# Patient Record
Sex: Female | Born: 1960 | Race: White | Hispanic: Yes | Marital: Single | Smoking: Never smoker
Health system: Southern US, Community
[De-identification: ages and names within clinical notes are randomized; demographics above are authoritative.]

## PROBLEM LIST (undated history)

## (undated) DIAGNOSIS — M1990 Unspecified osteoarthritis, unspecified site: Secondary | ICD-10-CM

## (undated) DIAGNOSIS — K760 Fatty (change of) liver, not elsewhere classified: Secondary | ICD-10-CM

## (undated) DIAGNOSIS — K648 Other hemorrhoids: Secondary | ICD-10-CM

## (undated) DIAGNOSIS — R7303 Prediabetes: Secondary | ICD-10-CM

## (undated) DIAGNOSIS — E785 Hyperlipidemia, unspecified: Secondary | ICD-10-CM

## (undated) DIAGNOSIS — I10 Essential (primary) hypertension: Secondary | ICD-10-CM

## (undated) HISTORY — PX: NO PAST SURGERIES: SHX2092

## (undated) HISTORY — DX: Fatty (change of) liver, not elsewhere classified: K76.0

## (undated) HISTORY — DX: Essential (primary) hypertension: I10

## (undated) HISTORY — DX: Hyperlipidemia, unspecified: E78.5

## (undated) HISTORY — DX: Other hemorrhoids: K64.8

---

## 2002-03-25 ENCOUNTER — Ambulatory Visit (HOSPITAL_COMMUNITY): Admission: RE | Admit: 2002-03-25 | Discharge: 2002-03-25 | Payer: Self-pay | Admitting: *Deleted

## 2002-07-15 ENCOUNTER — Encounter (HOSPITAL_COMMUNITY): Admission: AD | Admit: 2002-07-15 | Discharge: 2002-07-15 | Payer: Self-pay | Admitting: *Deleted

## 2002-07-23 ENCOUNTER — Inpatient Hospital Stay (HOSPITAL_COMMUNITY): Admission: AD | Admit: 2002-07-23 | Discharge: 2002-07-26 | Payer: Self-pay | Admitting: *Deleted

## 2004-01-09 ENCOUNTER — Emergency Department (HOSPITAL_COMMUNITY): Admission: EM | Admit: 2004-01-09 | Discharge: 2004-01-09 | Payer: Self-pay | Admitting: Emergency Medicine

## 2006-01-15 ENCOUNTER — Emergency Department (HOSPITAL_COMMUNITY): Admission: EM | Admit: 2006-01-15 | Discharge: 2006-01-15 | Payer: Self-pay | Admitting: Emergency Medicine

## 2011-12-27 ENCOUNTER — Other Ambulatory Visit (HOSPITAL_COMMUNITY): Payer: Self-pay | Admitting: Physician Assistant

## 2011-12-27 DIAGNOSIS — Z1231 Encounter for screening mammogram for malignant neoplasm of breast: Secondary | ICD-10-CM

## 2012-01-23 ENCOUNTER — Ambulatory Visit (HOSPITAL_COMMUNITY): Payer: Self-pay | Attending: Physician Assistant

## 2013-07-30 ENCOUNTER — Other Ambulatory Visit (HOSPITAL_COMMUNITY): Payer: Self-pay | Admitting: Physician Assistant

## 2013-07-30 DIAGNOSIS — Z1231 Encounter for screening mammogram for malignant neoplasm of breast: Secondary | ICD-10-CM

## 2013-08-08 ENCOUNTER — Ambulatory Visit: Payer: Self-pay

## 2013-08-08 ENCOUNTER — Ambulatory Visit: Payer: Self-pay | Admitting: Family Medicine

## 2013-08-08 VITALS — BP 120/78 | HR 59 | Temp 98.0°F | Resp 16 | Ht 61.0 in | Wt 161.0 lb

## 2013-08-08 DIAGNOSIS — M25562 Pain in left knee: Secondary | ICD-10-CM

## 2013-08-08 DIAGNOSIS — M542 Cervicalgia: Secondary | ICD-10-CM

## 2013-08-08 DIAGNOSIS — M25569 Pain in unspecified knee: Secondary | ICD-10-CM

## 2013-08-08 DIAGNOSIS — M79609 Pain in unspecified limb: Secondary | ICD-10-CM

## 2013-08-08 DIAGNOSIS — L259 Unspecified contact dermatitis, unspecified cause: Secondary | ICD-10-CM

## 2013-08-08 MED ORDER — HYDROCORTISONE 2.5 % EX OINT: TOPICAL_OINTMENT | Freq: Two times a day (BID) | CUTANEOUS | Status: DC

## 2013-08-08 MED ORDER — MELOXICAM 7.5 MG PO TABS: 7.5000 mg | ORAL_TABLET | Freq: Every day | ORAL | Status: DC

## 2013-08-08 NOTE — Progress Notes (Deleted)
  Subjective:    Patient ID: Dawn Espinoza, female    DOB: 1961-09-09, 52 y.o.   MRN: [id number]  HPI    Review of Systems     Objective:   Physical Exam        Assessment & Plan:

## 2013-08-08 NOTE — Patient Instructions (Addendum)
Esguince combinado del ligamento de la rodilla (Combined Knee Ligament Sprain) Un esguince combinado del ligamento de la rodilla es el desgarro de ms de uno de los ligamentos principales. Ellos son el ligamento cruzado anterior (LCA), el ligamento cruzado posterior (LCP), el ligamento colateral medial (LCM) y el ligamento colateral lateral (LCL). Los ligamentos [employer]. Generalmente cruzan una articulacin para [employer]. Los ligamentos de la rodilla mantienen alineados los huesos del fmur y Engineer, agricultural. Estos ligamentos permiten que la articulacin se mueva dentro de cierta amplitud de movimientos. Los movimientos fuera de esta amplitud causan un esguince. Las lesiones simultneas en los ligamentos mltiples causan dificultad en la prctica de deportes y en la vida diaria. Las lesiones ms frecuentes involucran al ligamento cruzado anterior y al ligamento colateral medio. SNTOMAS  Sensacin de estallido o ruptura en el momento de la lesin.  Imposibilidad de continuar la actividad despus de la lesin.  Inflamacin de la rodilla dentro de las 6 horas posteriores a la lesin.  Posiblemente se observe deformidad de la rodilla.  Imposibilidad para estirar la rodilla.  Sensacin de que la rodilla sale de su lugar o se dobla.  En algunos casos la rodilla se traba, si hay un dao en el cartlago de la articulacin (menisco).  En casos poco frecuentes, adormecimiento, debilidad, parlisis, cambio en el color o enfriamiento debido a una lesin en el nervio o en un vaso sanguneo. CAUSAS El esguince de mltiples ligamentos se produce cuando se ejerce una fuerza [employer], que es mayor a la que pueden soportar. Generalmente la causa es un golpe directo (traumatismo). Tambin puede ser consecuencia de una lesin en la que no hay contacto (hiperextensin con giro de la rodilla).  LOS RIESGOS AUMENTAN CON:  En los deportes de contacto (ftbol americano, rugby). Los  deportes que requieren [employer], Probation officer, interrumpir una carrera o cambiar de direccin (bsquet, gimnasia, ftbol, voley). Deportes en suelos desparejos, (carreras a travs del campo, ftbol).  Poca fuerza y flexibilidad.  Usar equipos que no adapten adecuadamente, o que no tengan la proteccin [place]. PREVENCIN  Precalentamiento adecuado y elongacin antes de la [place].  Mantener la forma fsica:  Flexibilidad en el muslo, la pierna y la rodilla.  La fuerza y la resistencia muscular.  Aprenda y USAA.  Utilice el equipo adecuado (tamao adecuado del taco segn la superficie). PRONSTICO Sin el tratamiento adecuado, la rodilla seguir doblndose y estar vulnerable a sufrir lesiones recurrentes al Microbiologist o en la vida diaria. Si la lesin incluye el dao a un nervio o arteria, aumenta la probabilidad de un mal resultado. Generalmente es necesario someterse a una ciruga para recobrar la estabilidad de la rodilla. COMPLICACIONES RELACIONADAS Generalmente los sntomas recurrentes son:  La rodilla se Pension scheme manager.  Inestabilidad de la articulacin.  Inflamacin  Lesin en el cartlago de la articulacin. Esto puede hacer que la rodilla se trabe o se hinche.  Lesin en el cartlago de la articulacin del fmur o la tibia. Puede causar artritis en la rodilla.  Lesin a otros ligamentos de la rodilla.  Rigidez de la rodilla (prdida del movimiento).  Lesin [employer] nervios (adormecimiento, debilidad, parlisis) o en las arterias.  Amputacin de la pierna debido a traumatismo en el nervio o en la arteria. TRATAMIENTO El tratamiento inicial incluye el uso de medicamentos y la aplicacin de hielo para reducir Chief Technology Officer y la inflamacin. Le indicarn el uso de muletas para disminuir  el dolor al caminar. La rodilla podr ser inmovilizada. La rehabilitacin se centra en disminuir de la hinchazn, recobrar la amplitud de  movimientos y el control muscular y la fuerza. Tambin incluye el entrenamiento [place], el uso de un soporte y Publishing rights manager. (Evite los deportes que implican el pivoteo, corte, cambio de direccin, salto y cadas). La ciruga generalmente ofrece la mejor probabilidad de una recuperacin completa. La ciruga para las lesiones combinadas de ligamento cruzado anterior y ligamento colateral medio incluye la reconstruccin del ligamento cruzado anterior. Esto tambin permite la curacin del ligamento colateral medio. Algunos deportistas nunca podrn volver a English as a second language teacher nivel de competicin, a pesar de someterse a una ciruga. La posibilidad de volver a Education administrator deportes depende de las lesiones relacionadas y las demandas del deporte.  MEDICAMENTOS  Si es necesaria la administracin de medicamentos para Chief Technology Officer, se recomiendan los antiinflamatorios no esteroides, como aspirina e ibuprofeno y otros calmantes menores, como acetaminofeno.  No tome medicamentos para el dolor dentro de los [address] previos a la [place].  Pueden prescribirle analgsicos ms fuertes. Utilcelos como se le indique y slo cuando lo necesite.  Contctese inmediatamente con el mdico si observa sangrado, malestar estomacal o signos de Freight forwarder. TERAPIA FRA El fro (con hielo) debe aplicarse durante 10 a 15 minutos cada 2  3 horas para reducir la inflamacin y Chief Technology Officer e inmediatamente despus de cualquier actividad que agrava los sntomas. Utilice bolsas o un masaje de hielo. SOLICITE ATENCIN MDICA SI:   Los sntomas empeoran o no mejoran en 6 semanas, a pesar de Medical illustrator.  Si despus del traumatismo o de la ciruga aparece alguno de los siguientes sntomas:  Dolor, adormecimiento, enfriamiento o coloracin azul, gris u oscura en el pie o en las uas.  fiebre, dolor intenso, hinchazn, enrojecimiento, drena lquido o sangra en la regin de la herida.  Aparecen signos de  infeccin (dolor de [place], dolores musculares, [place], [place], o una sensacin general de Dentist)  Desarrolla nuevos e inexplicables sntomas. (Los medicamentos indicados en el tratamiento le ocasionan efectos secundarios). Document Released: 08/21/2006 Document Revised: 01/27/2012 [facility] Patient Information 2014 [place], [place].

## 2013-08-08 NOTE — Progress Notes (Signed)
[address]   [place], [place]  [address]   [phone]  Subjective:    Patient ID: Dawn Espinoza, female    DOB: Nov 02, 1961, 52 y.o.   MRN: [id number]  HPI This 52 y.o. female presents for evaluation of L knee pain,    Continuous pain from L knee and L foot.    1. L knee pain:  Onset six months ago.  No injury.  No swelling.  +giving out.  Hurts a lot.  +popping.  No pain upon awakening; with walking, starts hurting.  Works at [employer].  Has taken medication; s/p injection with improvement and now recurrent.  No xray at that time.  Last injection two months ago.  Prescribed Meloxicam but advised not to take long term.    2.  L foot pain:  Onset two months ago.  Pain under foot with different shoes.  Work shoes cause pain.  No foot swelling.  No limping.  No injury.    3. Neck pain: upper back pain for several months; no injury; no radiation into arms.  No n/t/w.   4.  Thumb thickness of nail: concerned about nail and possible etiology.  Review of Systems  Constitutional: Negative for fever, chills, diaphoresis and fatigue.  Musculoskeletal: Positive for arthralgias, gait problem, myalgias, neck pain and neck stiffness. Negative for joint swelling.  Neurological: Negative for weakness and numbness.   Past Medical History  Diagnosis Date  . Hypertension    History reviewed. No pertinent past surgical history. No Known Allergies No current outpatient prescriptions on file prior to visit.   No current facility-administered medications on file prior to visit.   History   Social History  . Marital Status: Single    Spouse Name: N/A    Number of Children: N/A  . Years of Education: N/A   Occupational History  . Not on file.   Social History Main Topics  . Smoking status: Never Smoker   . Smokeless tobacco: Not on file  . Alcohol Use: No  . Drug Use: No  . Sexual Activity: No   Other Topics Concern  . Not on file   Social History Narrative  . No  narrative on file       Objective:   Physical Exam  Nursing note and vitals reviewed. Constitutional: She is oriented to person, place, and time. She appears well-developed and well-nourished. No distress.  HENT:  Head: Normocephalic and atraumatic.  Eyes: Conjunctivae are normal. Pupils are equal, round, and reactive to light.  Neck: Normal range of motion. Neck supple.  Cardiovascular: Normal rate, regular rhythm, normal heart sounds and intact distal pulses.  Exam reveals no gallop and no friction rub.   No murmur heard. Pulmonary/Chest: Effort normal and breath sounds normal. She has no wheezes. She has no rales.  Musculoskeletal:       Left knee: She exhibits normal range of motion, no swelling, no effusion, no erythema, normal alignment, no bony tenderness and normal meniscus. No tenderness found. No medial joint line, no lateral joint line, no MCL, no LCL and no patellar tendon tenderness noted.       Left ankle: Normal. She exhibits normal range of motion and no swelling. No tenderness. No lateral malleolus, no medial malleolus and no head of 5th metatarsal tenderness found. Achilles tendon normal. Achilles tendon exhibits no pain.       Cervical back: She exhibits tenderness, pain and spasm. She exhibits normal range of motion and no  bony tenderness.       Left foot: She exhibits normal range of motion, no tenderness, no bony tenderness and no swelling.  Cervical spine:  Non-tender midline; +TTP bilateral trapezius regions.  Neurological: She is alert and oriented to person, place, and time.  Skin: No rash noted. She is not diaphoretic.  Thumbs B with dystrophic nails with scaling hypertrophied skin B distal thumbs diffusely.  Psychiatric: She has a normal mood and affect. Her behavior is normal.   [facility] reading (PRIMARY) by  Dr. Katrinka Blazing.  L KNEE:  NAD    Assessment & Plan:  Left knee pain - Plan: DG Knee 1-2 Views Left  Pain, foot, left  Neck pain  Dermatitis, contact  1.  L knee pain/strain:  New.  Recommend rest, ice bid for two weeks, elevation; home exercise program also provided. 2.  Neck pain/cervical strain;  New.  Recommend Meloxicam 7.5mg  daily; home exercises recommended to perform daily; heat to area bid for 15 minutes each session; advised to avoid heavy lifting for 1-2 weeks. 3.  L foot pain: New.  Rx for Meloxicam provided; recommend icing foot bid for 15 minutes each session; recommend good supportive shoe and advised to avoid walking bare footed. 4.  Contact dermatitis: New.  B thumbs.  Rx for hydrocortisone 2.5% ointment provided to use bid x 2 weeks and then once daily x 2 weeks and then PRN.  Meds ordered this encounter  Medications  . lisinopril (PRINIVIL,ZESTRIL) 10 MG tablet    Sig: Take 10 mg by mouth daily.  Marland Kitchen DISCONTD: meloxicam (MOBIC) 7.5 MG tablet    Sig: Take 7.5 mg by mouth daily.  . fish oil-omega-3 fatty acids 1000 MG capsule    Sig: Take 2 g by mouth daily.  . meloxicam (MOBIC) 7.5 MG tablet    Sig: Take 1-2 tablets (7.5-15 mg total) by mouth daily.    Dispense:  60 tablet    Refill:  0  . hydrocortisone 2.5 % ointment    Sig: Apply topically 2 (two) times daily.    Dispense:  30 g    Refill:  0

## 2013-08-10 ENCOUNTER — Ambulatory Visit (HOSPITAL_COMMUNITY)
Admission: RE | Admit: 2013-08-10 | Discharge: 2013-08-10 | Disposition: A | Discharge status: Home / Self Care | Payer: Self-pay | Source: Ambulatory Visit | Attending: Physician Assistant | Admitting: Physician Assistant

## 2013-08-10 DIAGNOSIS — Z1231 Encounter for screening mammogram for malignant neoplasm of breast: Secondary | ICD-10-CM

## 2014-03-15 ENCOUNTER — Ambulatory Visit (INDEPENDENT_AMBULATORY_CARE_PROVIDER_SITE_OTHER): Payer: BC Managed Care – PPO | Admitting: Family Medicine

## 2014-03-15 VITALS — BP 136/83 | HR 72 | Temp 98.1°F | Resp 16 | Ht 61.0 in | Wt 165.8 lb

## 2014-03-15 DIAGNOSIS — M25562 Pain in left knee: Secondary | ICD-10-CM

## 2014-03-15 DIAGNOSIS — M25569 Pain in unspecified knee: Secondary | ICD-10-CM

## 2014-03-15 DIAGNOSIS — I10 Essential (primary) hypertension: Secondary | ICD-10-CM

## 2014-03-15 DIAGNOSIS — M25469 Effusion, unspecified knee: Secondary | ICD-10-CM

## 2014-03-15 MED ORDER — DICLOFENAC SODIUM 75 MG PO TBEC: 75.0000 mg | DELAYED_RELEASE_TABLET | Freq: Two times a day (BID) | ORAL | Status: DC

## 2014-03-15 MED ORDER — BISOPROLOL-HYDROCHLOROTHIAZIDE 5-6.25 MG PO TABS: 1.0000 | ORAL_TABLET | Freq: Every day | ORAL | Status: DC

## 2014-03-15 NOTE — Patient Instructions (Addendum)
Apply ice for 15 minutes about 3 times a night and then again a couple of times tomorrow  Return if any problems or concerns  The knee may hurts a little bit more tomorrow, but then should start improving  If the knee should develop redness or more intense pain return promptly  Will if the pain comes back very soon we will refer you to an orthopedic doctor  Recheck blood pressure in about 4 months

## 2014-03-16 LAB — SYNOVIAL CELL COUNT + DIFF, W/ CRYSTALS
Crystals, Fluid: NONE SEEN
Eosinophils-Synovial: 0 % (ref 0–1)
Lymphocytes-Synovial Fld: 71 % — ABNORMAL HIGH (ref 0–20)
Monocyte/Macrophage: 19 % — ABNORMAL LOW (ref 50–90)
Neutrophil, Synovial: 10 % (ref 0–25)
WBC, Synovial: 270 cu mm — ABNORMAL HIGH (ref 0–200)

## 2014-03-20 ENCOUNTER — Encounter: Payer: Self-pay | Admitting: Family Medicine

## 2014-03-20 NOTE — Progress Notes (Signed)
Subjective: Patient having more pain with knee on a regular basis.  NKI.  Hurts and swells some in left knee  Objective; Palpable effusion.  Tender along joint line and laterally.  Not warm or red.    Xray last fall no acute problems  Discussed rx options.  She chose to get injection here, if not better would see ortho.    Procedure: Sterile technique.  Lateral approach.  Aspirated 30 ml yellow clear fluid.  Injected 1 cc 40 mg kenalog and 2 cc 2% lidocaine.  Tolerated well.   Gave instrutions.  To return if worse at any time.  Assessment: Knee effusion and monoarthritis.  Plan  NSAID RTC prn.

## 2014-04-20 ENCOUNTER — Other Ambulatory Visit: Payer: Self-pay | Admitting: Family Medicine

## 2014-04-21 ENCOUNTER — Other Ambulatory Visit: Payer: Self-pay | Admitting: Family Medicine

## 2014-04-21 ENCOUNTER — Ambulatory Visit (INDEPENDENT_AMBULATORY_CARE_PROVIDER_SITE_OTHER): Payer: BC Managed Care – PPO | Admitting: Family Medicine

## 2014-04-21 ENCOUNTER — Ambulatory Visit: Payer: BC Managed Care – PPO

## 2014-04-21 VITALS — BP 160/90 | HR 61 | Temp 97.8°F | Resp 18 | Ht 61.0 in | Wt 163.4 lb

## 2014-04-21 DIAGNOSIS — I10 Essential (primary) hypertension: Secondary | ICD-10-CM

## 2014-04-21 DIAGNOSIS — M25562 Pain in left knee: Secondary | ICD-10-CM

## 2014-04-21 DIAGNOSIS — M25569 Pain in unspecified knee: Secondary | ICD-10-CM

## 2014-04-21 DIAGNOSIS — Z789 Other specified health status: Secondary | ICD-10-CM

## 2014-04-21 DIAGNOSIS — Z609 Problem related to social environment, unspecified: Secondary | ICD-10-CM

## 2014-04-21 DIAGNOSIS — M25469 Effusion, unspecified knee: Secondary | ICD-10-CM

## 2014-04-21 MED ORDER — HYDROCODONE-ACETAMINOPHEN 5-325 MG PO TABS: 0.5000 | ORAL_TABLET | Freq: Four times a day (QID) | ORAL | Status: DC | PRN

## 2014-04-21 MED ORDER — DICLOFENAC SODIUM 75 MG PO TBEC: 75.0000 mg | DELAYED_RELEASE_TABLET | Freq: Two times a day (BID) | ORAL | Status: DC

## 2014-04-21 MED ORDER — HYDROCODONE-ACETAMINOPHEN 5-325 MG PO TABS: 1.0000 | ORAL_TABLET | Freq: Four times a day (QID) | ORAL | Status: DC | PRN

## 2014-04-21 NOTE — Progress Notes (Addendum)
Subjective:  This chart was scribed for Dawn Agreste, MD by [name], Medial Scribe. This patient was seen in room 2 and the patient's care was started at 7:55 PM.    Patient ID: Dawn Espinoza, female    DOB: 1961/04/15, 53 y.o.   MRN: [id number]  HPI Dawn Espinoza is a 53 y.o. female PCP: No PCP Per Patient  Left knee pain Patient seen for left knee pain 03/15/14 by Dr. Linna Darner. Injected with Kenalog 40mg  and prescribed Voltaren 75mg  BID. Today patient is complaining of continued left knee pain and swelling. Patient reports improvement for 3 weeks. Today she reports increased pain and swelling.  Patient denies recent injury of trauma. Patient shares chronic pain in left knee  ongoing for one year. Patient denies injury prior to onset. Treatment with Clofenac BID for pain; patient only reports mild relief.   HTN When seen 04/28, patient started on Ziac 5/6.25 mg Qd for HTN. Patient exhausted blood pressure medication three days. States she need to visit the pharmacy.  Blood test and liver test performed 3 months ago at clinic here in town. Her liver test was fine, but her cholesterol was found to be high.  Patient has received HTN treatment at the clinic, but plans to continue her care here.   There are no active problems to display for this patient.  Past Medical History  Diagnosis Date  . Hypertension    History reviewed. No pertinent past surgical history. No Known Allergies Prior to Admission medications   Medication Sig Start Date End Date Taking? Authorizing Provider  bisoprolol-hydrochlorothiazide (ZIAC) 5-6.25 MG per tablet Take 1 tablet by mouth daily. 03/15/14  Yes [name], MD  diclofenac (VOLTAREN) 75 MG EC tablet Take 1 tablet (75 mg total) by mouth 2 (two) times daily. 03/15/14  Yes [name], MD  fish oil-omega-3 fatty acids 1000 MG capsule Take 2 g by mouth daily.   Yes Historical Provider, MD  hydrocortisone 2.5 % ointment Apply topically 2 (two) times  daily. 08/08/13   [name], MD  naproxen (NAPROSYN) 500 MG tablet Take 500 mg by mouth daily.    Historical Provider, MD   History   Social History  . Marital Status: Single    Spouse Name: N/A    Number of Children: N/A  . Years of Education: N/A   Occupational History  . Not on file.   Social History Main Topics  . Smoking status: Never Smoker   . Smokeless tobacco: Not on file  . Alcohol Use: No  . Drug Use: No  . Sexual Activity: No   Other Topics Concern  . Not on file   Social History Narrative  . No narrative on file      Review of Systems  Constitutional: Negative for fever, fatigue and unexpected weight change.  Respiratory: Negative for chest tightness and shortness of breath.   Cardiovascular: Negative for chest pain, palpitations and leg swelling.  Gastrointestinal: Negative for abdominal pain and blood in stool.  Musculoskeletal: Positive for arthralgias and joint swelling.  Neurological: Negative for dizziness, syncope, light-headedness and headaches.       Objective:   Physical Exam  Nursing note and vitals reviewed. Constitutional: She is oriented to person, place, and time. She appears well-developed and well-nourished. No distress.  HENT:  Head: Normocephalic and atraumatic.  Eyes: Conjunctivae and EOM are normal. Pupils are equal, round, and reactive to light.  Neck: Neck supple. Carotid bruit is not  present. No tracheal deviation present.  Cardiovascular: Normal rate, regular rhythm, normal heart sounds and intact distal pulses.   Pulmonary/Chest: Effort normal and breath sounds normal. No respiratory distress.  Abdominal: Soft. She exhibits no pulsatile midline mass. There is no tenderness.  Musculoskeletal: Normal range of motion.  Left knee: Full ROM. No sure to lateral joint line. Also having pain at medial joint line. Approximately 1 to 2+ effusion.  Slightly tender over medial joint line, but not lateral joint line with palpation.    Crepitance with McMurray testing but no specific click. Generalized soreness during exam.  Negative Lachman. Negative varus and valgus testing. Skin is intact. No erythema.   Neurological: She is alert and oriented to person, place, and time.  Skin: Skin is warm and dry.  Psychiatric: She has a normal mood and affect. Her behavior is normal.     Filed Vitals:   04/21/14 1948  BP: 160/90  Pulse:   Temp:   Resp:    [facility] reading (PRIMARY) by  Dr. Carlota Raspberry: L knee: no acute findings.      Assessment & Plan:   Dawn Espinoza is a 53 y.o. female Left knee pain -Knee effusion - Plan: diclofenac (VOLTAREN) 75 MG EC tablet, DISCONTINUED: HYDROcodone-acetaminophen (NORCO/VICODIN) 5-325 MG per tablet  - persistent pain after injection, persistent effusion - possible degenerative meniscal tear, but without relief from injection - will refer to ortho for eval. Cont diclofenac BID prn, lortab short term if needed for more severe pain.   Hypertension - Plan: Basic metabolic panel  - elevated as off meds past 3 days. Refill pending. If elevated ON meds, then do not take diclofenac. BMP pending as plan on following here for HTN.   Language barrier  -spanish spoken. Understanding expressed.    Meds ordered this encounter  Medications  . DISCONTD: HYDROcodone-acetaminophen (NORCO/VICODIN) 5-325 MG per tablet    Sig: Take 1 tablet by mouth every 6 (six) hours as needed for moderate pain.    Dispense:  20 tablet    Refill:  0  . diclofenac (VOLTAREN) 75 MG EC tablet    Sig: Take 1 tablet (75 mg total) by mouth 2 (two) times daily.    Dispense:  30 tablet    Refill:  0  . HYDROcodone-acetaminophen (NORCO/VICODIN) 5-325 MG per tablet    Sig: Take 0.5-1 tablets by mouth every 6 (six) hours as needed for moderate pain.    Dispense:  20 tablet    Refill:  0    Label meds in spanish.   Patient Instructions  Es necesario va a specialista de huesos por su rodilla. nosotros vamos a llama con numero  de specialista. laboratorios resulta de telephono en 1 semana.  Tome medicine por presion, y Government social research officer alta - regrese a Air traffic controller. Diclofenac 2 veces cada dia si necesario por dolor, y si necesario por dolor fuerte - hydrocodone. si peorse - regrese a Air traffic controller.

## 2014-04-21 NOTE — Patient Instructions (Addendum)
Es necesario va a specialista de huesos por su rodilla. nosotros vamos a llama con numero de specialista. laboratorios resulta de telephono en 1 semana.  Tome medicine por presion, y Government social research officer alta - regrese a Air traffic controller. Diclofenac 2 veces cada dia si necesario por dolor, y si necesario por dolor fuerte - hydrocodone. si peorse - regrese a Air traffic controller.

## 2014-04-21 NOTE — Telephone Encounter (Signed)
Dr Linna Darner, do you want to give RF or refer to ortho?

## 2014-04-22 ENCOUNTER — Other Ambulatory Visit: Payer: Self-pay | Admitting: Family Medicine

## 2014-04-22 LAB — BASIC METABOLIC PANEL
BUN: 11 mg/dL (ref 6–23)
CO2: 27 mEq/L (ref 19–32)
Calcium: 9.4 mg/dL (ref 8.4–10.5)
Chloride: 105 mEq/L (ref 96–112)
Creat: 0.86 mg/dL (ref 0.50–1.10)
Glucose, Bld: 81 mg/dL (ref 70–99)
Potassium: 4.3 mEq/L (ref 3.5–5.3)
Sodium: 142 mEq/L (ref 135–145)

## 2014-04-23 NOTE — Telephone Encounter (Signed)
Give one refill but she needs to come in for reassessment

## 2014-04-24 ENCOUNTER — Encounter: Payer: Self-pay | Admitting: *Deleted

## 2014-05-11 ENCOUNTER — Other Ambulatory Visit: Payer: Self-pay | Admitting: Family Medicine

## 2015-01-25 ENCOUNTER — Other Ambulatory Visit: Payer: Self-pay | Admitting: Urgent Care

## 2015-01-25 ENCOUNTER — Ambulatory Visit (INDEPENDENT_AMBULATORY_CARE_PROVIDER_SITE_OTHER): Payer: BLUE CROSS/BLUE SHIELD

## 2015-01-25 ENCOUNTER — Ambulatory Visit (INDEPENDENT_AMBULATORY_CARE_PROVIDER_SITE_OTHER): Payer: BLUE CROSS/BLUE SHIELD | Admitting: Urgent Care

## 2015-01-25 VITALS — BP 150/84 | HR 64 | Temp 98.4°F | Resp 16 | Ht 61.0 in | Wt 162.0 lb

## 2015-01-25 DIAGNOSIS — I10 Essential (primary) hypertension: Secondary | ICD-10-CM

## 2015-01-25 DIAGNOSIS — R319 Hematuria, unspecified: Secondary | ICD-10-CM | POA: Diagnosis not present

## 2015-01-25 DIAGNOSIS — M1712 Unilateral primary osteoarthritis, left knee: Secondary | ICD-10-CM

## 2015-01-25 DIAGNOSIS — R8290 Unspecified abnormal findings in urine: Secondary | ICD-10-CM | POA: Diagnosis not present

## 2015-01-25 DIAGNOSIS — M25562 Pain in left knee: Secondary | ICD-10-CM | POA: Diagnosis not present

## 2015-01-25 DIAGNOSIS — N390 Urinary tract infection, site not specified: Secondary | ICD-10-CM | POA: Diagnosis not present

## 2015-01-25 DIAGNOSIS — R109 Unspecified abdominal pain: Secondary | ICD-10-CM

## 2015-01-25 DIAGNOSIS — M129 Arthropathy, unspecified: Secondary | ICD-10-CM | POA: Diagnosis not present

## 2015-01-25 DIAGNOSIS — K5900 Constipation, unspecified: Secondary | ICD-10-CM

## 2015-01-25 DIAGNOSIS — R102 Pelvic and perineal pain: Secondary | ICD-10-CM

## 2015-01-25 LAB — COMPREHENSIVE METABOLIC PANEL
ALT: 17 U/L (ref 0–35)
AST: 20 U/L (ref 0–37)
Albumin: 4.3 g/dL (ref 3.5–5.2)
Alkaline Phosphatase: 87 U/L (ref 39–117)
BUN: 14 mg/dL (ref 6–23)
CO2: 24 mEq/L (ref 19–32)
Calcium: 9.6 mg/dL (ref 8.4–10.5)
Chloride: 101 mEq/L (ref 96–112)
Creat: 0.71 mg/dL (ref 0.50–1.10)
Glucose, Bld: 85 mg/dL (ref 70–99)
Potassium: 4.1 mEq/L (ref 3.5–5.3)
Sodium: 139 mEq/L (ref 135–145)
Total Bilirubin: 0.3 mg/dL (ref 0.2–1.2)
Total Protein: 7.4 g/dL (ref 6.0–8.3)

## 2015-01-25 LAB — POCT CBC
Granulocyte percent: 62.6 %G (ref 37–80)
HCT, POC: 42.8 % (ref 37.7–47.9)
Hemoglobin: 12.8 g/dL (ref 12.2–16.2)
Lymph, poc: 3.1 (ref 0.6–3.4)
MCH, POC: 28.1 pg (ref 27–31.2)
MCHC: 29.8 g/dL — AB (ref 31.8–35.4)
MCV: 94.3 fL (ref 80–97)
MID (cbc): 0.7 (ref 0–0.9)
MPV: 7.2 fL (ref 0–99.8)
POC Granulocyte: 6.3 (ref 2–6.9)
POC LYMPH PERCENT: 30.8 %L (ref 10–50)
POC MID %: 6.6 %M (ref 0–12)
Platelet Count, POC: 328 10*3/uL (ref 142–424)
RBC: 4.54 M/uL (ref 4.04–5.48)
RDW, POC: 14.6 %
WBC: 10.1 10*3/uL (ref 4.6–10.2)

## 2015-01-25 LAB — POCT URINALYSIS DIPSTICK
Bilirubin, UA: NEGATIVE
Glucose, UA: NEGATIVE
Ketones, UA: NEGATIVE
Nitrite, UA: NEGATIVE
Protein, UA: NEGATIVE
Spec Grav, UA: 1.02
Urobilinogen, UA: 0.2
pH, UA: 7.5

## 2015-01-25 LAB — POCT UA - MICROSCOPIC ONLY
Casts, Ur, LPF, POC: NEGATIVE
Crystals, Ur, HPF, POC: NEGATIVE
Mucus, UA: NEGATIVE
Yeast, UA: NEGATIVE

## 2015-01-25 MED ORDER — POLYETHYLENE GLYCOL 3350 17 GM/SCOOP PO POWD: 17.0000 g | Freq: Two times a day (BID) | ORAL | Status: DC

## 2015-01-25 MED ORDER — CIPROFLOXACIN HCL 500 MG PO TABS: 500.0000 mg | ORAL_TABLET | Freq: Two times a day (BID) | ORAL | Status: DC

## 2015-01-25 MED ORDER — DOCUSATE SODIUM 50 MG PO CAPS
50.0000 mg | ORAL_CAPSULE | Freq: Two times a day (BID) | ORAL | Status: DC
Start: 2015-01-25 — End: 2015-09-11

## 2015-01-25 MED ORDER — DICLOFENAC SODIUM 3 % TD GEL: 1.0000 "application " | Freq: Two times a day (BID) | TRANSDERMAL | Status: DC

## 2015-01-25 NOTE — Progress Notes (Deleted)
    MRN: [id number] DOB: 03/10/1967  Subjective:   Adrijana Haros is a 54 y.o. female presenting for chief complaint of Annual Exam; Knee Pain; and Hip Pain  Reports  *** smoking, *** alcohol. Denies any other aggravating or relieving factors, no other questions or concerns.  Harlyn currently has no medications in their medication list.   has no allergies on file.  Jazarah  has no past medical history on file. Also  has no past surgical history on file.  ROS As in subjective.  Objective:   Vitals: BP 150/84 mmHg  Pulse 64  Temp(Src) 98.4 F (36.9 C) (Oral)  Resp 16  Ht 5\' 1"  (1.549 m)  Wt 162 lb (73.483 kg)  BMI 30.63 kg/m2  SpO2 99%  Physical Exam  No results found for this or any previous visit (from the past 24 hour(s)).  Assessment and Plan :     Jaynee Eagles, PA-C [facility] [phone] 01/25/2015 3:45 PM

## 2015-01-25 NOTE — Progress Notes (Signed)
MRN: [id number] DOB: 03/10/1967  Subjective:   Dawn Espinoza is a 54 y.o. female presenting for chief complaint of Annual Exam; Knee Pain; and Hip Pain  Reports 2 year history of right sided abdominal pain, is achy in nature worse in the evening after eating, does not radiate, comes in waves, does not occur every day. Has not tried any medications for relief. Denies fever, n/v, bloating, constipation, has 1 BM per day, had small amount blood in her stool once ~1 month ago. Tries to eat healthy, balanced meals. Has a history of UTI, pyelonephritis. Denies history of renal stones. Patient is post-menopausal, states that she has not had a menstrual cycle for 2 years. Has never had an abnormal pap smear. Denies history of cancer.   Knee pain - has had right knee pain, states that she was evaluated at [facility] 05/2014, diagnosed with arthritis. States that she had knee aspiration and was sent to orthopedist for management. She was unable to afford treatment with the specialist at that time, would like a referral back to ortho now that she has better coverage.  Elevated blood pressure - has been managed for HTN with HCT-bisoprolol. Ran out 3 months ago. Does not check her blood pressure. Denies light-headedness, chest pain, heart racing, lower leg swelling. Avoids salt in her diet. Started exercising in January 2016, feels like she's doing better physically.   Denies smoking or alcohol use. Denies any other aggravating or relieving factors, no other questions or concerns.  Dawn Espinoza is taking blood pressure medication (she cannot recall the name), multivitamin. She has no allergies on file.  Dawn Espinoza  has no past medical history on file. Also  has no past surgical history on file.  ROS As in subjective.  Objective:   Vitals: BP 150/84 mmHg  Pulse 64  Temp(Src) 98.4 F (36.9 C) (Oral)  Resp 16  Ht 5\' 1"  (1.549 m)  Wt 162 lb (73.483 kg)  BMI 30.63 kg/m2  SpO2  99%  Physical Exam  Constitutional: She is oriented to person, place, and time and well-developed, well-nourished, and in no distress.  Cardiovascular: Normal rate, regular rhythm and intact distal pulses.  Exam reveals no gallop and no friction rub.   No murmur heard. Pulmonary/Chest: No respiratory distress. She has no wheezes. She has no rales. She exhibits no tenderness.  Abdominal: Soft. Bowel sounds are normal. She exhibits no distension and no mass. There is tenderness (diffuse generalized tenderness right abdominal side and right pelvic side). There is no rebound and no guarding.  Musculoskeletal:       Left knee: She exhibits swelling (mild swelling). She exhibits normal range of motion, no ecchymosis, no deformity and no erythema. Tenderness (diffuse throughout left knee) found.  Neurological: She is alert and oriented to person, place, and time. She has normal reflexes.  Skin: Skin is warm and dry. No rash noted. No erythema. No pallor.   Results for orders placed or performed in visit on 01/25/15 (from the past 24 hour(s))  POCT CBC     Status: Abnormal   Collection Time: 01/25/15  4:39 PM  Result Value Ref Range   WBC 10.1 4.6 - 10.2 K/uL   Lymph, poc 3.1 0.6 - 3.4   POC LYMPH PERCENT 30.8 10 - 50 %L   MID (cbc) 0.7 0 - 0.9   POC MID % 6.6 0 - 12 %M   POC Granulocyte 6.3 2 - 6.9   Granulocyte percent 62.6 37 -  80 %G   RBC 4.54 4.04 - 5.48 M/uL   Hemoglobin 12.8 12.2 - 16.2 g/dL   HCT, POC 42.8 37.7 - 47.9 %   MCV 94.3 80 - 97 fL   MCH, POC 28.1 27 - 31.2 pg   MCHC 29.8 (A) 31.8 - 35.4 g/dL   RDW, POC 14.6 %   Platelet Count, POC 328 142 - 424 K/uL   MPV 7.2 0 - 99.8 fL  POCT urinalysis dipstick     Status: None   Collection Time: 01/25/15  4:42 PM  Result Value Ref Range   Color, UA yellow    Clarity, UA clear    Glucose, UA neg    Bilirubin, UA neg    Ketones, UA neg    Spec Grav, UA 1.020    Blood, UA moderate    pH, UA 7.5    Protein, UA neg     Urobilinogen, UA 0.2    Nitrite, UA neg    Leukocytes, UA Trace   POCT UA - Microscopic Only     Status: None   Collection Time: 01/25/15  4:43 PM  Result Value Ref Range   WBC, Ur, HPF, POC 4-5    RBC, urine, microscopic 10-15    Bacteria, U Microscopic trace    Mucus, UA neg    Epithelial cells, urine per micros 2-6    Crystals, Ur, HPF, POC neg    Casts, Ur, LPF, POC neg    Yeast, UA neg    [facility] reading (PRIMARY) by  Dr. Ouida Sills and PA-Shandie Bertz. Abdominal: Increased stool burden most prominent in ascending colon.  Dg Abd 1 View  01/25/2015   CLINICAL DATA:  Two year history of right-sided abdominal pain.  EXAM: ABDOMEN - 1 VIEW  COMPARISON:  None.  FINDINGS: Moderate colonic stool burden without evidence of enteric obstruction.  Nondiagnostic evaluation for pneumoperitoneum secondary to supine positioning and exclusion of the lower thorax. No definite pneumatosis or portal venous gas.  No definite abnormal intra-abdominal calcifications.  No acute osseus abnormalities.  IMPRESSION: Moderate colonic stool burden without evidence of obstruction.   Electronically Signed   By: Sandi Mariscal M.D.   On: 01/25/2015 20:08   Assessment and Plan :   1. Right sided abdominal pain 2. Pelvic pain in female 3. Abnormal urinalysis 4. Urinary tract infection with hematuria, site unspecified 5. Constipation, unspecified constipation type - abdominal and pelvic pain likely due to concurrent UTI and constipation - Advised to start ciprofloxacin for UTI. Start miralax and/or docusate for constipation. - Return to clinic in 1 week if symptoms fail to resolve  6. Essential hypertension - Uncontrolled, discontinue bis-HCT combo. Start HCT 12.5mg  daily - Advised to follow up in 1 month at appointment center for recheck and annual physical  7. Left knee pain 8. Arthritis of left knee - Referred to ortho, physical therapy - Start diclofenac gel, follow up in 1 month  Dawn Eagles, PA-C [facility] [phone] 01/25/2015 3:51 PM

## 2015-01-25 NOTE — Patient Instructions (Signed)
Physical Therapy [phone] Please call Orvan July, Office Manager to set up an appointment Office Hours: [place], [place] [id number] (Inside the [facility])    Estreimiento (Constipation) Estreimiento significa que una persona tiene menos de tres evacuaciones en una semana, dificultad para defecar, o que las heces son secas, duras, o ms grandes que lo normal. A medida que envejecemos el estreimiento es ms comn. Si intenta curar el estreimiento con medicamentos que producen la evacuacin de las heces (laxantes), el problema puede empeorar. El uso prolongado de laxantes puede hacer que los msculos del colon se debiliten. Una dieta baja en fibra, no tomar suficientes lquidos y el uso de ciertos medicamentos pueden Agricultural engineer.  CAUSAS   Ciertos medicamentos, como los antidepresivos, analgsicos, suplementos de hierro, anticidos y diurticos.  Algunas enfermedades, como la diabetes, el sndrome del colon irritable, enfermedad de la tiroides, o depresin.  No beber suficiente agua.  No consumir suficientes alimentos ricos en fibra.  Situaciones de estrs o viajes.  Falta de actividad fsica o de ejercicio.  Ignorar la necesidad sbita de Landscape architect.  Uso en exceso de laxantes. SIGNOS Y SNTOMAS   Defecar menos de tres veces por semana.  Dificultad para defecar.  Tener las heces secas y duras, o ms grandes que las normales.  Sensacin de estar lleno o hinchado.  Dolor en la parte baja del abdomen.  No sentir alivio despus de defecar. DIAGNSTICO  El mdico le har una historia clnica y un examen fsico. Pueden hacerle exmenes adicionales para el estreimiento grave. Estos estudios pueden ser:  Un radiografa con enema de bario para examinar el recto, el colon y, en algunos casos, el intestino delgado.  Una sigmoidoscopia para examinar el colon inferior.  Una colonoscopia para examinar todo el colon. TRATAMIENTO    El tratamiento depender de la gravedad del estreimiento y de la causa. Algunos tratamientos nutricionales son beber ms lquidos y comer ms alimentos ricos en fibra. El cambio en el estilo de vida incluye hacer ejercicios de [place] regular. Si estas recomendaciones para Animator dieta y en el estilo de vida no ayudan, el mdico le puede indicar el uso de laxantes de venta libre para ayudarlo a Landscape architect. Los medicamentos recetados se pueden prescribir si los medicamentos de venta libre no lo [place].  INSTRUCCIONES PARA EL CUIDADO EN EL HOGAR   Consuma alimentos con alto contenido de [place], como frutas, vegetales, cereales integrales y porotos.  Limite los alimentos procesados ricos en grasas y azcar, como las papas fritas, hamburguesas, galletas, dulces y refrescos.  Puede agregar un suplemento de fibra a su dieta si no obtiene lo suficiente de los alimentos.  Beba suficiente lquido para Consulting civil engineer orina clara o de color amarillo plido.  Haga ejercicio regularmente o segn las indicaciones del mdico.  Vaya al bao cuando sienta la necesidad de ir. No se aguante las ganas.  Tome solo medicamentos de venta libre o recetados, segn las indicaciones del mdico. No tome otros medicamentos para el estreimiento sin consultarlo antes con su mdico. SOLICITE ATENCIN MDICA DE INMEDIATO SI:   Observa sangre brillante en las heces.  El estreimiento dura ms de 4 das o [place].  Siente dolor abdominal o rectal.  Las heces son delgadas como un lpiz.  Pierde peso de [place] inexplicable. ASEGRESE DE QUE:   Comprende estas instrucciones.  Controlar su afeccin.  Recibir ayuda de inmediato si no mejora o si empeora. Document Released: 11/24/2007 Document Revised:  11/09/2013 ExitCare Patient Information 2015 [place], [place]. This information is not intended to replace advice given to you by your health care provider. Make sure you discuss any questions you have with  your health care provider.    Infeccin urinaria  (Urinary Tract Infection)  La infeccin urinaria puede ocurrir en Clinical cytogeneticist del tracto urinario. El tracto urinario es un sistema de drenaje del cuerpo por el que se eliminan los desechos y el exceso de [place]. El tracto urinario est formado por dos riones, dos urteres, la vejiga y Geologist, engineering. Los riones son rganos que tienen forma de frijol. Cada rin tiene aproximadamente el tamao del puo. Estn situados debajo de las [place], uno a cada lado de la columna vertebral CAUSAS  La causa de la infeccin son los microbios, que son organismos microscpicos, que incluyen hongos, virus, y bacterias. Estos organismos son tan pequeos que slo pueden verse a travs del microscopio. Las bacterias son los microorganismos que ms comnmente causan infecciones urinarias.  SNTOMAS  Los sntomas pueden variar segn la edad y el sexo del paciente y por la ubicacin de la infeccin. Los sntomas en las mujeres jvenes incluyen la necesidad frecuente e intensa de orinar y una sensacin dolorosa de ardor en la vejiga o en la uretra durante la miccin. Las mujeres y los hombres mayores podrn sentir cansancio, temblores y debilidad y Arts development officer musculares y Social research officer, government abdominal. Si tiene [place], puede significar que la infeccin est en los riones. Otros sntomas son dolor en la espalda o en los lados debajo de las [place], nuseas y vmitos.  DIAGNSTICO  Para diagnosticar una infeccin urinaria, el mdico le preguntar acerca de sus sntomas. [employer] una [place] de [place]. La muestra de orina se analiza para Hydrographic surveyor bacterias y glbulos blancos de Herbalist. Los glbulos blancos se forman en el organismo para ayudar a Radio broadcast assistant las infecciones.  TRATAMIENTO  Por lo general, las infecciones urinarias pueden tratarse con medicamentos. Debido a que la [employer] de las infecciones son causadas por bacterias, por lo general pueden tratarse con  antibiticos. La eleccin del antibitico y la duracin del tratamiento depender de sus sntomas y el tipo de bacteria causante de la infeccin.  INSTRUCCIONES PARA EL CUIDADO EN EL HOGAR   Si le recetaron antibiticos, tmelos exactamente como su mdico le indique. Termine el medicamento aunque se sienta mejor despus de haber tomado slo algunos.  Beba gran cantidad de lquido para mantener la orina de tono claro o color amarillo plido.  Evite la cafena, el t y las bebidas gaseosas. Estas sustancias irritan la vejiga.  Vaciar la vejiga con frecuencia. Evite retener la orina durante largos perodos.  Vace la vejiga antes y despus de Clinical biochemist.  Despus de mover el intestino, las mujeres deben higienizarse la regin perineal desde adelante hacia atrs. Use slo un papel tissue por vez. SOLICITE ATENCIN MDICA SI:   Siente dolor en la espalda.  Le sube la fiebre.  Los sntomas no mejoran luego de 3 das. SOLICITE ATENCIN MDICA DE INMEDIATO SI:   Siente dolor intenso en la espalda o en la zona inferior del abdomen.  Comienza a sentir escalofros.  Tiene nuseas o vmitos.  Tiene una sensacin continua de quemazn o molestias al [employer]. ASEGRESE DE QUE:   Comprende estas instrucciones.  Controlar su enfermedad.  Solicitar ayuda de inmediato si no mejora o empeora. Document Released: 08/14/2005 Document Revised: 07/29/2012 [facility] Patient Information 2015 [place]. This information is not intended to replace  advice given to you by your health care provider. Make sure you discuss any questions you have with your health care provider.  

## 2015-01-26 ENCOUNTER — Encounter: Payer: Self-pay | Admitting: Urgent Care

## 2015-01-26 MED ORDER — HYDROCHLOROTHIAZIDE 12.5 MG PO TABS: 12.5000 mg | ORAL_TABLET | Freq: Every day | ORAL | Status: DC

## 2015-01-26 NOTE — Addendum Note (Signed)
Addended by: Jaynee Eagles on: 01/26/2015 06:50 PM   Modules accepted: Orders

## 2015-01-27 LAB — URINE CULTURE: Colony Count: 70000

## 2015-01-27 NOTE — Progress Notes (Signed)
  Medical screening examination/treatment/procedure(s) were performed by non-physician practitioner and as supervising physician I was immediately available for consultation/collaboration.     

## 2015-01-31 NOTE — Progress Notes (Signed)
Scheduled for 03/08/15 @ 3pm for a complete physical for [facility].

## 2015-02-01 ENCOUNTER — Telehealth: Payer: Self-pay | Admitting: Family Medicine

## 2015-02-01 NOTE — Telephone Encounter (Signed)
-----   Message from Jaynee Eagles, [place] sent at 01/31/2015  1:33 PM EDT ----- Regarding: Urine culture still processing Can you please call and check on these results? They are overdue in my basket and show that they are still "processing" but it's been 6 days. I appreciate your help!  Thank you! Rosario Adie  ----- Message -----    From: SYSTEM    Sent: 01/30/2015  12:04 AM      To: Jaynee Eagles, PA-C

## 2015-02-01 NOTE — Telephone Encounter (Signed)
I called [facility] they claim that they don't have patient in their system I let them know that they did a CMP on patient and she states that she only have blood work done in their system from 2015

## 2015-03-08 ENCOUNTER — Encounter: Payer: Self-pay | Admitting: Urgent Care

## 2015-09-09 ENCOUNTER — Ambulatory Visit (INDEPENDENT_AMBULATORY_CARE_PROVIDER_SITE_OTHER): Payer: BLUE CROSS/BLUE SHIELD | Admitting: Emergency Medicine

## 2015-09-09 VITALS — BP 142/84 | HR 72 | Temp 99.0°F | Resp 16 | Ht 61.0 in | Wt 159.0 lb

## 2015-09-09 DIAGNOSIS — J209 Acute bronchitis, unspecified: Secondary | ICD-10-CM | POA: Diagnosis not present

## 2015-09-09 DIAGNOSIS — G8929 Other chronic pain: Secondary | ICD-10-CM | POA: Diagnosis not present

## 2015-09-09 DIAGNOSIS — R1011 Right upper quadrant pain: Secondary | ICD-10-CM

## 2015-09-09 DIAGNOSIS — R1010 Upper abdominal pain, unspecified: Secondary | ICD-10-CM

## 2015-09-09 DIAGNOSIS — J0140 Acute pansinusitis, unspecified: Secondary | ICD-10-CM

## 2015-09-09 DIAGNOSIS — I10 Essential (primary) hypertension: Secondary | ICD-10-CM | POA: Diagnosis not present

## 2015-09-09 MED ORDER — HYDROCOD POLST-CPM POLST ER 10-8 MG/5ML PO SUER
5.0000 mL | Freq: Two times a day (BID) | ORAL | Status: DC
Start: 2015-09-09 — End: 2016-02-27

## 2015-09-09 MED ORDER — PSEUDOEPHEDRINE-GUAIFENESIN ER 60-600 MG PO TB12: 1.0000 | ORAL_TABLET | Freq: Two times a day (BID) | ORAL | Status: DC

## 2015-09-09 MED ORDER — HYDROCHLOROTHIAZIDE 25 MG PO TABS: 25.0000 mg | ORAL_TABLET | Freq: Every day | ORAL | Status: DC

## 2015-09-09 MED ORDER — AMOXICILLIN-POT CLAVULANATE 875-125 MG PO TABS: 1.0000 | ORAL_TABLET | Freq: Two times a day (BID) | ORAL | Status: DC

## 2015-09-09 NOTE — Patient Instructions (Signed)
Hipertensin (Hypertension) La hipertensin, conocida comnmente como presin arterial alta, se produce cuando la sangre bombea en las arterias con mucha fuerza. Las arterias son los vasos sanguneos que transportan la sangre desde el corazn hacia todas las partes del cuerpo. Una lectura de la presin arterial consiste en un nmero ms alto sobre un nmero ms bajo, por ejemplo, 110/72. El nmero ms alto (presin sistlica) corresponde a la presin interna de las arterias cuando el corazn bombea sangre. El nmero ms bajo (presin diastlica) corresponde a la presin interna de las arterias cuando el corazn se relaja. En condiciones ideales, la presin arterial debe ser inferior a 120/80. La hipertensin fuerza al corazn a trabajar ms para bombear la sangre. Las arterias pueden estrecharse o ponerse rgidas. La hipertensin no tratada o no controlada puede causar infarto de miocardio, ictus, enfermedad renal y otros problemas. FACTORES DE RIESGO Algunos factores de riesgo de hipertensin son controlables, pero otros no lo son.  Entre los factores de riesgo que usted no puede controlar, se incluyen los siguientes:   La raza. El riesgo es mayor para las personas afroamericanas.  La edad. Los riesgos aumentan con la edad.  El sexo. Antes de los 45aos, los hombres corren ms riesgo que las mujeres. Despus de los 65aos, las mujeres corren ms riesgo que los hombres. Entre los factores de riesgo que usted puede controlar, se incluyen los siguientes:  No hacer la cantidad suficiente de actividad fsica o ejercicio.  Tener sobrepeso.  Consumir mucha grasa, azcar, caloras o sal en la dieta.  Beber alcohol en exceso. SIGNOS Y SNTOMAS Por lo general, la hipertensin no causa signos o sntomas. La hipertensin arterial demasiado alta (crisis hipertensiva) puede causar dolor de cabeza, ansiedad, falta de aire y hemorragia nasal. DIAGNSTICO Para detectar si usted tiene hipertensin, el  mdico le medir la presin arterial mientras est sentado, con el brazo levantado a la altura del corazn. Debe medirla al menos dos veces en el mismo brazo. Determinadas condiciones pueden causar una diferencia de presin arterial entre el brazo izquierdo y el derecho. El hecho de tener una sola lectura de la presin arterial ms alta que lo normal no significa que necesita un tratamiento. Si no est claro si tiene hipertensin arterial, es posible que se le pida que regrese otro da para volver a controlarle la presin arterial. O bien se le puede pedir que se controle la presin arterial en su casa durante 1 o ms meses. TRATAMIENTO El tratamiento de la hipertensin arterial incluye hacer cambios en el estilo de vida y, posiblemente, tomar medicamentos. Un estilo de vida saludable puede ayudar a bajar la presin arterial alta. Quiz deba cambiar algunos hbitos. Los cambios en el estilo de vida pueden incluir lo siguiente:  Seguir la dieta DASH. Esta dieta tiene un alto contenido de frutas, verduras y cereales integrales. Incluye poca cantidad de sal, carnes rojas y azcares agregados.  Mantenga el consumo de sodio por debajo de 2300 mg por da.  Realizar al menos entre 30 y 45 minutos de ejercicio aerbico, 4 veces por semana como mnimo.  Perder peso, si es necesario.  No fumar.  Limitar el consumo de bebidas alcohlicas.  Aprender formas de reducir el estrs. El mdico puede recetarle medicamentos si los cambios en el estilo de vida no son suficientes para lograr controlar la presin arterial y si una de las siguientes afirmaciones es verdadera:  Tiene entre 18 y 59 aos y su presin arterial sistlica est por encima de 140.  Tiene   60 aos o ms y su presin arterial sistlica est por encima de 150.  Su presin arterial diastlica est por encima de 90.  Tiene diabetes y su presin arterial sistlica est por encima de 140 o su presin arterial diastlica est por encima de  90.  Tiene una enfermedad renal y su presin arterial est por encima de 140/90.  Tiene una enfermedad cardaca y su presin arterial est por encima de 140/90. La presin arterial deseada puede variar en funcin de las enfermedades, la edad y otros factores personales. INSTRUCCIONES PARA EL CUIDADO EN EL HOGAR  Haga que le midan de nuevo la presin arterial segn las indicaciones del mdico.  Tome los medicamentos solamente como se lo haya indicado el mdico. Siga cuidadosamente las indicaciones. Los medicamentos para la presin arterial deben tomarse segn las indicaciones. Los medicamentos pierden eficacia al omitir las dosis. El hecho de omitir las dosis tambin aumenta el riesgo de otros problemas.  No fume.  Contrlese la presin arterial en su casa segn las indicaciones del mdico. SOLICITE ATENCIN MDICA SI:   Piensa que tiene una reaccin alrgica a los medicamentos.  Tiene mareos o dolores de cabeza con recurrencia.  Tiene hinchazn en los tobillos.  Tiene problemas de visin. SOLICITE ATENCIN MDICA DE INMEDIATO SI:  Siente un dolor de cabeza intenso o confusin.  Siente debilidad inusual, adormecimiento o que se desmayar.  Siente dolor intenso en el pecho o en el abdomen.  Vomita repetidas veces.  Tiene dificultad para respirar. ASEGRESE DE QUE:   Comprende estas instrucciones.  Controlar su afeccin.  Recibir ayuda de inmediato si no mejora o si empeora.   Esta informacin no tiene como fin reemplazar el consejo del mdico. Asegrese de hacerle al mdico cualquier pregunta que tenga.   Document Released: 11/04/2005 Document Revised: 03/21/2015 Elsevier Interactive Patient Education 2016 Elsevier Inc.  

## 2015-09-09 NOTE — Progress Notes (Signed)
Subjective:  Patient ID: Dawn Espinoza, female    DOB: Aug 25, 1961  Age: 54 y.o. MRN: [id number]  CC: Cough; Sore Throat; and Back Pain   HPI Dawn Espinoza presents  with nasal congestion postnasal drainage and sore throat. She cough productive of purulent sputum she has no wheezing or shortness of breath. Is no nausea vomiting or stool change she's had fever or chills. No stool change she's had no improvement with over-the-counter medication she is concerned about her blood pressure and has been taking hydrochlorothiazide 12.5  History Sahiti has a past medical history of Hypertension.   She has no past surgical history on file.   Her  family history includes Alcohol abuse in her father; Heart disease in her father.  She   reports that she has never smoked. She does not have any smokeless tobacco history on file. She reports that she does not drink alcohol or use illicit drugs.  Outpatient Prescriptions Prior to Visit  Medication Sig Dispense Refill  . hydrochlorothiazide (HYDRODIURIL) 12.5 MG tablet Take 1 tablet (12.5 mg total) by mouth daily. 90 tablet 3  . ciprofloxacin (CIPRO) 500 MG tablet Take 1 tablet (500 mg total) by mouth 2 (two) times daily. (Patient not taking: Reported on 09/09/2015) 14 tablet 0  . Diclofenac Sodium 3 % GEL Place 1 application onto the skin 2 (two) times daily. (Patient not taking: Reported on 09/09/2015) 100 g 3  . docusate sodium (COLACE) 50 MG capsule Take 1 capsule (50 mg total) by mouth 2 (two) times daily. (Patient not taking: Reported on 09/09/2015) 30 capsule 3  . fish oil-omega-3 fatty acids 1000 MG capsule Take 2 g by mouth daily.    Marland Kitchen HYDROcodone-acetaminophen (NORCO/VICODIN) 5-325 MG per tablet Take 0.5-1 tablets by mouth every 6 (six) hours as needed for moderate pain. (Patient not taking: Reported on 09/09/2015) 20 tablet 0  . hydrocortisone 2.5 % ointment Apply topically 2 (two) times daily. (Patient not taking: Reported on 09/09/2015) 30 g  0  . naproxen (NAPROSYN) 500 MG tablet Take 500 mg by mouth daily.    . polyethylene glycol powder (GLYCOLAX/MIRALAX) powder Take 17 g by mouth 2 (two) times daily. (Patient not taking: Reported on 09/09/2015) 119 g 3   No facility-administered medications prior to visit.    Social History   Social History  . Marital Status: Single    Spouse Name: N/A  . Number of Children: N/A  . Years of Education: N/A   Social History Main Topics  . Smoking status: Never Smoker   . Smokeless tobacco: None  . Alcohol Use: No  . Drug Use: No  . Sexual Activity: No   Other Topics Concern  . None   Social History Narrative   ** Merged History Encounter **         Review of Systems  Constitutional: Positive for fever. Negative for chills and appetite change.  HENT: Positive for congestion, postnasal drip, rhinorrhea, sinus pressure and sore throat. Negative for ear pain.   Eyes: Negative for pain and redness.  Respiratory: Positive for cough. Negative for shortness of breath and wheezing.   Cardiovascular: Negative for leg swelling.  Gastrointestinal: Negative for nausea, vomiting, abdominal pain, diarrhea, constipation and blood in stool.  Endocrine: Negative for polyuria.  Genitourinary: Negative for dysuria, urgency, frequency and flank pain.  Musculoskeletal: Negative for gait problem.  Skin: Negative for rash.  Neurological: Negative for weakness and headaches.  Psychiatric/Behavioral: Negative for confusion and decreased concentration. The patient is  not nervous/anxious.     Objective:  BP 142/84 mmHg  Pulse 72  Temp(Src) 99 F (37.2 C)  Resp 16  Ht 5\' 1"  (1.549 m)  Wt 159 lb (72.122 kg)  BMI 30.06 kg/m2  SpO2 98%  Physical Exam  Constitutional: She is oriented to person, place, and time. She appears well-developed and well-nourished. No distress.  HENT:  Head: Normocephalic and atraumatic.  Right Ear: External ear normal.  Left Ear: External ear normal.  Nose: Nose  normal.  Eyes: Conjunctivae and EOM are normal. Pupils are equal, round, and reactive to light. No scleral icterus.  Neck: Normal range of motion. Neck supple. No tracheal deviation present.  Cardiovascular: Normal rate, regular rhythm and normal heart sounds.   Pulmonary/Chest: Effort normal. No respiratory distress. She has no wheezes. She has no rales.  Abdominal: She exhibits no mass. There is no tenderness. There is no rebound and no guarding.  Musculoskeletal: She exhibits no edema.  Lymphadenopathy:    She has no cervical adenopathy.  Neurological: She is alert and oriented to person, place, and time. Coordination normal.  Skin: Skin is warm and dry. No rash noted.  Psychiatric: She has a normal mood and affect. Her behavior is normal.      Assessment & Plan:   Morgane was seen today for cough, sore throat and back pain.  Diagnoses and all orders for this visit:  Acute bronchitis, unspecified organism -     US Abdomen Complete; Future  Acute pansinusitis, recurrence not specified -     US Abdomen Complete; Future  Abdominal pain, chronic, right upper quadrant -     US Abdomen Complete; Future  Essential hypertension -     hydrochlorothiazide (HYDRODIURIL) 25 MG tablet; Take 1 tablet (25 mg total) by mouth daily.  Other orders -     amoxicillin-clavulanate (AUGMENTIN) 875-125 MG tablet; Take 1 tablet by mouth 2 (two) times daily. -     pseudoephedrine-guaifenesin (MUCINEX D) 60-600 MG 12 hr tablet; Take 1 tablet by mouth every 12 (twelve) hours. -     chlorpheniramine-HYDROcodone (TUSSIONEX PENNKINETIC ER) 10-8 MG/5ML SUER; Take 5 mLs by mouth 2 (two) times daily.   I have changed Ms. Test's hydrochlorothiazide. I am also having her start on amoxicillin-clavulanate, pseudoephedrine-guaifenesin, and chlorpheniramine-HYDROcodone. Additionally, I am having her maintain her fish oil-omega-3 fatty acids, hydrocortisone, naproxen, HYDROcodone-acetaminophen, ibuprofen,  traMADol, ciprofloxacin, docusate sodium, polyethylene glycol powder, and Diclofenac Sodium.  Meds ordered this encounter  Medications  . ibuprofen (ADVIL,MOTRIN) 200 MG tablet    Sig: Take 200 mg by mouth every 6 (six) hours as needed.  . traMADol (ULTRAM) 50 MG tablet    Sig: Take by mouth every 6 (six) hours as needed.  Marland Kitchen amoxicillin-clavulanate (AUGMENTIN) 875-125 MG tablet    Sig: Take 1 tablet by mouth 2 (two) times daily.    Dispense:  20 tablet    Refill:  0  . pseudoephedrine-guaifenesin (MUCINEX D) 60-600 MG 12 hr tablet    Sig: Take 1 tablet by mouth every 12 (twelve) hours.    Dispense:  18 tablet    Refill:  0  . chlorpheniramine-HYDROcodone (TUSSIONEX PENNKINETIC ER) 10-8 MG/5ML SUER    Sig: Take 5 mLs by mouth 2 (two) times daily.    Dispense:  60 mL    Refill:  0  . hydrochlorothiazide (HYDRODIURIL) 25 MG tablet    Sig: Take 1 tablet (25 mg total) by mouth daily.    Dispense:  30 tablet  Refill:  2    Appropriate red flag conditions were discussed with the patient as well as actions that should be taken.  Patient expressed his understanding.  Follow-up: Return if symptoms worsen or fail to improve.  Roselee Culver, MD

## 2015-09-11 ENCOUNTER — Encounter: Payer: Self-pay | Admitting: Urgent Care

## 2015-09-11 ENCOUNTER — Ambulatory Visit (INDEPENDENT_AMBULATORY_CARE_PROVIDER_SITE_OTHER): Payer: BLUE CROSS/BLUE SHIELD | Admitting: Urgent Care

## 2015-09-11 VITALS — BP 134/77 | HR 105 | Temp 98.4°F | Resp 16 | Ht 62.0 in | Wt 156.8 lb

## 2015-09-11 DIAGNOSIS — Z0000 Encounter for general adult medical examination without abnormal findings: Secondary | ICD-10-CM

## 2015-09-11 DIAGNOSIS — I10 Essential (primary) hypertension: Secondary | ICD-10-CM

## 2015-09-11 DIAGNOSIS — M25562 Pain in left knee: Secondary | ICD-10-CM | POA: Diagnosis not present

## 2015-09-11 DIAGNOSIS — K5900 Constipation, unspecified: Secondary | ICD-10-CM | POA: Diagnosis not present

## 2015-09-11 DIAGNOSIS — R1084 Generalized abdominal pain: Secondary | ICD-10-CM

## 2015-09-11 DIAGNOSIS — J209 Acute bronchitis, unspecified: Secondary | ICD-10-CM | POA: Diagnosis not present

## 2015-09-11 DIAGNOSIS — M179 Osteoarthritis of knee, unspecified: Secondary | ICD-10-CM | POA: Diagnosis not present

## 2015-09-11 DIAGNOSIS — Z23 Encounter for immunization: Secondary | ICD-10-CM | POA: Diagnosis not present

## 2015-09-11 DIAGNOSIS — M1712 Unilateral primary osteoarthritis, left knee: Secondary | ICD-10-CM

## 2015-09-11 LAB — COMPREHENSIVE METABOLIC PANEL
ALT: 25 U/L (ref 6–29)
AST: 23 U/L (ref 10–35)
Albumin: 4.4 g/dL (ref 3.6–5.1)
Alkaline Phosphatase: 82 U/L (ref 33–130)
BUN: 18 mg/dL (ref 7–25)
CO2: 32 mmol/L — ABNORMAL HIGH (ref 20–31)
Calcium: 10 mg/dL (ref 8.6–10.4)
Chloride: 98 mmol/L (ref 98–110)
Creat: 0.91 mg/dL (ref 0.50–1.05)
Glucose, Bld: 88 mg/dL (ref 65–99)
Potassium: 4.1 mmol/L (ref 3.5–5.3)
Sodium: 139 mmol/L (ref 135–146)
Total Bilirubin: 0.3 mg/dL (ref 0.2–1.2)
Total Protein: 7.6 g/dL (ref 6.1–8.1)

## 2015-09-11 LAB — CBC
HCT: 41.8 % (ref 36.0–46.0)
Hemoglobin: 13.9 g/dL (ref 12.0–15.0)
MCH: 30 pg (ref 26.0–34.0)
MCHC: 33.3 g/dL (ref 30.0–36.0)
MCV: 90.3 fL (ref 78.0–100.0)
MPV: 9.6 fL (ref 8.6–12.4)
Platelets: 314 10*3/uL (ref 150–400)
RBC: 4.63 MIL/uL (ref 3.87–5.11)
RDW: 13.4 % (ref 11.5–15.5)
WBC: 9.2 10*3/uL (ref 4.0–10.5)

## 2015-09-11 LAB — LIPID PANEL
Cholesterol: 211 mg/dL — ABNORMAL HIGH (ref 125–200)
HDL: 42 mg/dL — ABNORMAL LOW (ref >=46)
LDL Cholesterol: 96 mg/dL (ref <130)
Total CHOL/HDL Ratio: 5 Ratio (ref <=5.0)
Triglycerides: 367 mg/dL — ABNORMAL HIGH (ref <150)
VLDL: 73 mg/dL — ABNORMAL HIGH (ref <30)

## 2015-09-11 NOTE — Progress Notes (Signed)
MRN: [id number]  Subjective:   Dawn Espinoza is a 54 y.o. female presenting for annual physical exam.  Medical care team includes: PCP: No PCP Per Patient Vision: Last visit ~2 years ago. Dental: None, no insurance. Specialists: Cassie Freer, diagnosed with OA of her left knee, knee replacement recommended.  Abdominal pain - reports longstanding history of intermittent abdominal pain. Patient does not have bowel movements every day, strains when she defecates, eats unhealthy Latino food, hardly any fiber. She does not exercise secondary to her knee pain. Has not tried medications for this.  Lemmie has a current medication list which includes the following prescription(s): amoxicillin-clavulanate, chlorpheniramine-hydrocodone, hydrochlorothiazide, pseudoephedrine-guaifenesin, and tramadol. She has No Known Allergies.  Dawn Espinoza  has a past medical history of Hypertension. Also  has no past surgical history on file.  Her family history includes Alcohol abuse in her father; Heart disease in her father.  Immunizations: Declined flu shot, last TDAP unknown  ROS  Objective:   Vitals: BP 134/77 mmHg  Pulse 105  Temp(Src) 98.4 F (36.9 C) (Oral)  Resp 16  Ht 5\' 2"  (1.575 m)  Wt 156 lb 12.8 oz (71.124 kg)  BMI 28.67 kg/m2  Physical Exam  Constitutional: She is oriented to person, place, and time. She appears well-developed and well-nourished.  HENT:  TM's intact bilaterally, no effusions or erythema. Nares patent, nasal turbinates pink and moist, nasal passages patent. No sinus tenderness. Oropharynx clear, mucous membranes moist, dentition in good repair.   Eyes: Conjunctivae and EOM are normal. Pupils are equal, round, and reactive to light. Right eye exhibits no discharge. Left eye exhibits no discharge. No scleral icterus.  Neck: Normal range of motion. Neck supple. No thyromegaly present.  Cardiovascular: Normal rate, regular rhythm and intact distal pulses.  Exam  reveals no gallop and no friction rub.   No murmur heard. Pulmonary/Chest: No respiratory distress. She has no wheezes. She has no rales.  Abdominal: Soft. Bowel sounds are normal. She exhibits no distension and no mass. There is no tenderness.  Genitourinary: No labial fusion. There is no rash, tenderness, lesion or injury on the right labia. There is no rash, tenderness, lesion or injury on the left labia. Uterus is not deviated, not enlarged, not fixed and not tender. Cervix exhibits no motion tenderness, no discharge and no friability. Right adnexum displays no mass, no tenderness and no fullness. Left adnexum displays no mass, no tenderness and no fullness. No erythema, tenderness or bleeding in the vagina. No foreign body around the vagina. No signs of injury around the vagina. No vaginal discharge found.  Musculoskeletal: Normal range of motion. She exhibits no edema or tenderness.  Lymphadenopathy:    She has no cervical adenopathy.  Neurological: She is alert and oriented to person, place, and time. She has normal reflexes.  Skin: Skin is warm and dry. No rash noted. No erythema. No pallor.  Psychiatric: She has a normal mood and affect.   Assessment and Plan :   1. Annual physical exam - Medically stable, labs pending - Discussed healthy lifestyle, diet, exercise, preventative care, vaccinations, and addressed patient's concerns.   2. Osteoarthritis of left knee, unspecified osteoarthritis type 3. Left knee pain - Stable, continue follow up with [facility]  4. Constipation, unspecified constipation type 5. Generalized abdominal pain - Start constipation management. Recheck in 4 weeks.  6. Essential hypertension - Continue management with HCT 25mg , recheck in 6 months  7. Acute bronchitis, unspecified organism - Continue treatment as  developed in clinic.  8. Need for Tdap vaccination - Tdap vaccine greater than or equal to 7yo IM   Jaynee Eagles, PA-C [facility] [phone] 09/11/2015  1:09 PM

## 2015-09-11 NOTE — Patient Instructions (Addendum)
- Tome docusate todos los dias 1-2 vezes al dia. Puede usar Miralax para estrenimiento.   Estreimiento - Adultos (Constipation, Adult) Estreimiento significa que una persona tiene menos de tres evacuaciones en una semana, dificultad para defecar, o que las heces son secas, duras, o ms grandes que lo normal. A medida que envejecemos el estreimiento es ms comn. Una dieta baja en fibra, no tomar suficientes lquidos y el uso de ciertos medicamentos pueden Agricultural engineer.  CAUSAS   Ciertos medicamentos, como los antidepresivos, analgsicos, suplementos de hierro, anticidos y diurticos.  Algunas enfermedades, como la diabetes, el sndrome del colon irritable, enfermedad de la tiroides, o depresin.  No beber suficiente agua.  No consumir suficientes alimentos ricos en fibra.  Situaciones de estrs o viajes.  Falta de actividad fsica o de ejercicio.  Ignorar la necesidad sbita de Landscape architect.  Uso en exceso de laxantes. SIGNOS Y SNTOMAS   Defecar menos de tres veces por semana.  Dificultad para defecar.  Tener las heces secas y duras, o ms grandes que las normales.  Sensacin de estar lleno o hinchado.  Dolor en la parte baja del abdomen.  No sentir alivio despus de defecar. DIAGNSTICO  El mdico le har una historia clnica y un examen fsico. Pueden hacerle exmenes adicionales para el estreimiento grave. Estos estudios pueden ser:  Un radiografa con enema de bario para examinar el recto, el colon y, en algunos casos, el intestino delgado.  Una sigmoidoscopia para examinar el colon inferior.  Una colonoscopia para examinar todo el colon. TRATAMIENTO  El tratamiento depender de la gravedad del estreimiento y de la causa. Algunos tratamientos nutricionales son beber ms lquidos y comer ms alimentos ricos en fibra. El cambio en el estilo de vida incluye hacer ejercicios de Lindy regular. Si estas recomendaciones para Animator dieta y en  el estilo de vida no ayudan, el mdico le puede indicar el uso de laxantes de venta libre para ayudarlo a Landscape architect. Los medicamentos recetados se pueden prescribir si los medicamentos de venta libre no lo [place].  INSTRUCCIONES PARA EL CUIDADO EN EL HOGAR   Consuma alimentos con alto contenido de [place], como frutas, vegetales, cereales integrales y porotos.  Limite los alimentos procesados ricos en grasas y azcar, como las papas fritas, hamburguesas, galletas, dulces y refrescos.  Puede agregar un suplemento de fibra a su dieta si no obtiene lo suficiente de los alimentos.  Beba suficiente lquido para Consulting civil engineer orina clara o de color amarillo plido.  Haga ejercicio regularmente o segn las indicaciones del mdico.  Vaya al bao cuando sienta la necesidad de ir. No se aguante las ganas.  Tome solo medicamentos de venta libre o recetados, segn las indicaciones del mdico. No tome otros medicamentos para el estreimiento sin consultarlo antes con su mdico. SOLICITE ATENCIN MDICA DE INMEDIATO SI:   Observa sangre brillante en las heces.  El estreimiento dura ms de 4 das o [place].  Siente dolor abdominal o rectal.  Las heces son delgadas como un lpiz.  Pierde peso de [place] inexplicable. ASEGRESE DE QUE:   Comprende estas instrucciones.  Controlar su afeccin.  Recibir ayuda de inmediato si no mejora o si empeora.   Esta informacin no tiene Marine scientist el consejo del mdico. Asegrese de hacerle al mdico cualquier pregunta que tenga.   Document Released: 11/24/2007 Document Revised: 11/25/2014 Elsevier Interactive Patient Education 2016 [employer].    Tramadol tablets Qu es este medicamento? El TRAMADOL es un analgsico. Se  utiliza para tratar dolores moderados o severos en adultos. Este medicamento puede ser utilizado para otros usos; si tiene alguna pregunta consulte con su proveedor de atencin mdica o con su farmacutico. Qu le debo  informar a mi profesional de la salud antes de tomar este medicamento? Necesita saber si usted presenta alguno de los [employer] o situaciones: -tumor cerebral -depresin -abuso de drogas o drogadiccin -lesin de la cabeza -si consume bebidas alcohlicas con frecuencia -enfermedad renal o problemas al orinar -enfermedad heptica -enfermedad pulmonar, asma o problemas respiratorios -convulsiones o epilepsia -ideas suicidas, planes o intento; si usted o alguien de su familia ha intentado un suicidio previo -una reaccin alrgica o inusual al tramadol, a la codena, a otros medicamentos, alimentos, colorantes o conservantes -si est embarazada o buscando quedar embarazada -si est amamantando a un beb Cmo debo utilizar este medicamento? Tome este medicamento por va oral con un vaso lleno de agua. Siga las instrucciones de la etiqueta del [place]. Si el Transport planner, tmelo con alimentos o con [place]. No tome su medicamento con una frecuencia mayor que la indicada. Hable con su pediatra para informarse acerca del uso de este medicamento en nios. Puede requerir atencin especial. Sobredosis: Pngase en contacto inmediatamente con un centro toxicolgico o una sala de urgencia si usted cree que haya tomado demasiado medicamento. ATENCIN: [employer] es solo para usted. No comparta este medicamento con nadie. Qu sucede si me olvido de una dosis? Si olvida una dosis, tmela lo antes posible. Si es casi la hora de la prxima dosis, tome slo esa dosis. No tome dosis adicionales o dobles. Qu puede interactuar con este medicamento? No tome esta medicina con ninguno de los siguientes medicamentos: -IMAOs, tales como Carbex, Eldepryl, Marplan, Nardil y Parnate Esta medicina tambin puede interactuar con los siguientes medicamentos: -alcohol o medicamentos que contienen alcohol -antihistamnicos -benzodiacepinas -bupropion -carbamazepina u  oxcarbazepina -clozapina -ciclobenzaprina -digoxina -furazolidona -linezolid -medicamentos para la depresin, ansiedad o trastornos psicticos -medicamentos para las migraas, tales como almotriptn, eletriptn, frovatriptn, naratriptn, rizatriptn, sumatriptn, zolmitriptn -analgsicos, incluyendo pentazocina, buprenorfina, butorfanol, meperidina, nalbufina y propoxifeno -medicamentos para conciliar el sueo -relajantes musculares -naltrexona -fenobarbital -fenotiazinas, tales como perfenacina, tioridazina, clorpromacina, mesoridazina, flufenazina, proclorperazina, promazina y trifluoperazina -procarbazina -warfarina Puede ser que esta lista no menciona todas las posibles interacciones. Informe a su profesional de [employer]	[place] de [employer] productos a base de hierbas, medicamentos de [place] o suplementos nutritivos que est tomando. Si usted fuma, consume bebidas alcohlicas o si utiliza drogas ilegales, indqueselo tambin a su profesional de [employer]	[place]. Algunas sustancias pueden interactuar con su medicamento. A qu debo estar atento al usar [employer]? Si el dolor no desaparece, si empeora o si experimenta un dolor nuevo o de tipo diferente, consulte a su mdico o a su profesional de [employer]	[place]. Usted puede desarrollar tolerancia al medicamento. La tolerancia significa que necesitar una dosis ms alta para Best boy. Tolerancia es normal y esperada cuando est tomando este medicamento por un largo perodo de [place]. No suspenda el uso de su medicamento repentinamente debido a que puede Engineer, materials reaccin severa. Su cuerpo se acostumbra a Fish farm manager. Esto NO significa que sea adicto. La adiccin es un comportamiento que hace referencia a la obtencin y utilizacin de un medicamento con fines que no son mdicos. Si tiene Social research officer, government, existe una razn mdica para que usted tome un analgsico. Su mdico le indicar la cantidad de medicamento que Tree surgeon. Si su mdico  desea que pare  el tratamiento, la dosis ser reducida gradualmente para Research officer, political party secundarios. Puede experimentar mareos o somnolencia. No conduzca ni utilice maquinaria, ni haga nada que Associate Professor en estado de alerta hasta que sepa cmo le afecta este medicamento. No se siente ni se ponga de pie con rapidez, especialmente si es un paciente de edad avanzada. Esto reduce el riesgo de mareos o [employer]. El alcohol puede aumentar o disminuir el efecto de este medicamento. Evite consumir bebidas alcohlicas. Este medicamento puede causar estreimiento. Trate de evacuar los intestinos al menos cada 2  3 das. Si no evacua los intestinos durante 3 das, comunquese con su mdico o con su profesional de [employer]	[place]. Se le podr secar la boca. Masticar chicle sin azcar, chupar caramelos duros y tomar agua en abundancia le ayudar a mantener la boca hmeda. Si el problema no desaparece o es severo, consulte a su mdico. Qu efectos secundarios puedo tener al [employer] este medicamento? Efectos secundarios que debe informar a su mdico o a Barrister's clerk de la salud tan pronto como sea posible: -Chief of Staff como erupcin cutnea, picazn o urticarias, hinchazn de la cara, labios o lengua -dificultades respiratorias, sibilancias -confusin -picazn -aturdimiento o desmayos -enrojecimiento, formacin de ampollas, descamacin o aflojamiento de la piel, inclusive dentro de la boca -convulsiones Efectos secundarios que, por lo general, no requieren atencin mdica (debe informarlos a su mdico o a su profesional de la salud si persisten o si son molestos): -estreimiento -mareos -somnolencia -dolor de cabeza -nuseas, vmitos Puede ser que esta lista no menciona todos los posibles efectos secundarios. Comunquese a su mdico por asesoramiento mdico [employer]. Usted puede informar los efectos secundarios a la FDA por telfono al 1-800-FDA-1088. Dnde debo guardar mi  medicina? Mantngala fuera del alcance de los nios. [place]a a [employer], entre 15 y 68 grados C (5 y 56 grados F). Mantenga el envase bien cerrado. Deseche los medicamentos que no haya utilizado, despus de la fecha de vencimiento. ATENCIN: Este folleto es un resumen. Puede ser que no cubra toda la posible informacin. Si usted tiene preguntas acerca de esta medicina, consulte con su mdico, su farmacutico o su profesional de Technical sales engineer.    2016, Elsevier/Gold Standard. (2014-12-27 00:00:00)

## 2015-09-12 LAB — PAP IG W/ RFLX HPV ASCU

## 2015-09-12 LAB — TSH: TSH: 2.125 u[IU]/mL (ref 0.350–4.500)

## 2015-09-14 ENCOUNTER — Telehealth: Payer: Self-pay | Admitting: Family Medicine

## 2015-09-14 ENCOUNTER — Telehealth: Payer: Self-pay | Admitting: Urgent Care

## 2015-09-14 ENCOUNTER — Ambulatory Visit
Admission: RE | Admit: 2015-09-14 | Discharge: 2015-09-14 | Disposition: A | Discharge status: Home / Self Care | Payer: BLUE CROSS/BLUE SHIELD | Source: Ambulatory Visit | Attending: Emergency Medicine | Admitting: Emergency Medicine

## 2015-09-14 DIAGNOSIS — E782 Mixed hyperlipidemia: Secondary | ICD-10-CM

## 2015-09-14 DIAGNOSIS — J0140 Acute pansinusitis, unspecified: Secondary | ICD-10-CM

## 2015-09-14 DIAGNOSIS — R1011 Right upper quadrant pain: Secondary | ICD-10-CM

## 2015-09-14 DIAGNOSIS — J209 Acute bronchitis, unspecified: Secondary | ICD-10-CM

## 2015-09-14 DIAGNOSIS — G8929 Other chronic pain: Secondary | ICD-10-CM

## 2015-09-14 MED ORDER — FENOFIBRATE 54 MG PO TABS: 54.0000 mg | ORAL_TABLET | Freq: Every day | ORAL | Status: DC

## 2015-09-14 NOTE — Telephone Encounter (Signed)
-----   Message from Roselee Culver, MD sent at 09/14/2015  1:33 PM EDT ----- Let her know I have ordered a nuclear medicine study.

## 2015-09-14 NOTE — Telephone Encounter (Signed)
Spoke with pts son as she only speaks spanish. Informed son that a nuclear study has been ordered and someone will be in touch with her about appt time.

## 2015-09-14 NOTE — Addendum Note (Signed)
Addended by: Roselee Culver on: 09/14/2015 01:32 PM   Modules accepted: Orders, SmartSet

## 2015-09-14 NOTE — Telephone Encounter (Signed)
Reviewed physical exam labs. Starting fenofibrate for mixed hyperlipidemia, triglycerides>LDL. All other labs unremarkable. Also reviewed her US abdominal results which was ordered by Dr. Ouida Sills. Patient to follow up in 6 months or sooner if necessary.

## 2015-09-15 ENCOUNTER — Other Ambulatory Visit: Payer: Self-pay

## 2015-11-13 ENCOUNTER — Emergency Department (HOSPITAL_COMMUNITY)
Admission: EM | Admit: 2015-11-13 | Discharge: 2015-11-13 | Disposition: A | Discharge status: Home / Self Care | Payer: BLUE CROSS/BLUE SHIELD | Attending: Emergency Medicine | Admitting: Emergency Medicine

## 2015-11-13 ENCOUNTER — Emergency Department (HOSPITAL_COMMUNITY): Payer: BLUE CROSS/BLUE SHIELD

## 2015-11-13 ENCOUNTER — Encounter (HOSPITAL_COMMUNITY): Payer: Self-pay | Admitting: Emergency Medicine

## 2015-11-13 DIAGNOSIS — R1011 Right upper quadrant pain: Secondary | ICD-10-CM | POA: Diagnosis not present

## 2015-11-13 DIAGNOSIS — B349 Viral infection, unspecified: Secondary | ICD-10-CM | POA: Diagnosis not present

## 2015-11-13 DIAGNOSIS — I10 Essential (primary) hypertension: Secondary | ICD-10-CM | POA: Insufficient documentation

## 2015-11-13 DIAGNOSIS — M25569 Pain in unspecified knee: Secondary | ICD-10-CM | POA: Diagnosis not present

## 2015-11-13 DIAGNOSIS — J069 Acute upper respiratory infection, unspecified: Secondary | ICD-10-CM | POA: Insufficient documentation

## 2015-11-13 DIAGNOSIS — R05 Cough: Secondary | ICD-10-CM | POA: Diagnosis present

## 2015-11-13 DIAGNOSIS — Z792 Long term (current) use of antibiotics: Secondary | ICD-10-CM | POA: Insufficient documentation

## 2015-11-13 DIAGNOSIS — G8929 Other chronic pain: Secondary | ICD-10-CM | POA: Insufficient documentation

## 2015-11-13 DIAGNOSIS — Z79899 Other long term (current) drug therapy: Secondary | ICD-10-CM | POA: Insufficient documentation

## 2015-11-13 DIAGNOSIS — R109 Unspecified abdominal pain: Secondary | ICD-10-CM

## 2015-11-13 LAB — URINALYSIS, ROUTINE W REFLEX MICROSCOPIC
Bilirubin Urine: NEGATIVE
Glucose, UA: NEGATIVE mg/dL
Ketones, ur: NEGATIVE mg/dL
Nitrite: NEGATIVE
Protein, ur: NEGATIVE mg/dL
Specific Gravity, Urine: 1.008 (ref 1.005–1.030)
pH: 6 (ref 5.0–8.0)

## 2015-11-13 LAB — CBC WITH DIFFERENTIAL/PLATELET
Basophils Absolute: 0.1 10*3/uL (ref 0.0–0.1)
Basophils Relative: 1 %
Eosinophils Absolute: 0.1 10*3/uL (ref 0.0–0.7)
Eosinophils Relative: 1 %
HCT: 41.2 % (ref 36.0–46.0)
Hemoglobin: 13.2 g/dL (ref 12.0–15.0)
Lymphocytes Relative: 31 %
Lymphs Abs: 2.1 10*3/uL (ref 0.7–4.0)
MCH: 30.4 pg (ref 26.0–34.0)
MCHC: 32 g/dL (ref 30.0–36.0)
MCV: 94.9 fL (ref 78.0–100.0)
Monocytes Absolute: 0.5 10*3/uL (ref 0.1–1.0)
Monocytes Relative: 8 %
Neutro Abs: 4 10*3/uL (ref 1.7–7.7)
Neutrophils Relative %: 59 %
Platelets: 317 10*3/uL (ref 150–400)
RBC: 4.34 MIL/uL (ref 3.87–5.11)
RDW: 13.3 % (ref 11.5–15.5)
WBC: 6.8 10*3/uL (ref 4.0–10.5)

## 2015-11-13 LAB — COMPREHENSIVE METABOLIC PANEL WITH GFR
ALT: 27 U/L (ref 14–54)
AST: 27 U/L (ref 15–41)
Albumin: 4.5 g/dL (ref 3.5–5.0)
Alkaline Phosphatase: 62 U/L (ref 38–126)
Anion gap: 11 (ref 5–15)
BUN: 10 mg/dL (ref 6–20)
CO2: 28 mmol/L (ref 22–32)
Calcium: 9.5 mg/dL (ref 8.9–10.3)
Chloride: 102 mmol/L (ref 101–111)
Creatinine, Ser: 0.87 mg/dL (ref 0.44–1.00)
GFR calc Af Amer: >60 mL/min
GFR calc non Af Amer: >60 mL/min
Glucose, Bld: 107 mg/dL — ABNORMAL HIGH (ref 65–99)
Potassium: 3.8 mmol/L (ref 3.5–5.1)
Sodium: 141 mmol/L (ref 135–145)
Total Bilirubin: 0.2 mg/dL — ABNORMAL LOW (ref 0.3–1.2)
Total Protein: 7.9 g/dL (ref 6.5–8.1)

## 2015-11-13 LAB — URINE MICROSCOPIC-ADD ON: Bacteria, UA: NONE SEEN

## 2015-11-13 LAB — LIPASE, BLOOD: Lipase: 44 U/L (ref 11–51)

## 2015-11-13 MED ORDER — ACETAMINOPHEN 325 MG PO TABS
650.0000 mg | ORAL_TABLET | Freq: Once | ORAL | Status: AC
  Administered 2015-11-13: 650 mg via ORAL
  Filled 2015-11-13: qty 2

## 2015-11-13 MED ORDER — DICYCLOMINE HCL 20 MG PO TABS: 20.0000 mg | ORAL_TABLET | Freq: Two times a day (BID) | ORAL | Status: DC

## 2015-11-13 MED ORDER — BENZONATATE 100 MG PO CAPS: 100.0000 mg | ORAL_CAPSULE | Freq: Three times a day (TID) | ORAL | Status: DC

## 2015-11-13 MED ORDER — GUAIFENESIN ER 600 MG PO TB12: 600.0000 mg | ORAL_TABLET | Freq: Two times a day (BID) | ORAL | Status: DC

## 2015-11-13 MED ORDER — NAPROXEN 500 MG PO TABS: 500.0000 mg | ORAL_TABLET | Freq: Two times a day (BID) | ORAL | Status: DC

## 2015-11-13 NOTE — Progress Notes (Signed)
Entered in D/c instructions  Por favor vaya a localizar [email or web address], encontrar un mdico zona a utilizar para buscar en la red del proveedor de atencin primaria y especialistas Schedule an appointment as soon as possible for a visit Por favor vaya a localizar [email or web address], encontrar un mdico zona a utilizar para buscar en la red del proveedor de atencin primaria y especialistas compruebe cualquier proveedor recomienda usted est en la red

## 2015-11-13 NOTE — Discharge Instructions (Signed)
- Your URI (cold) symptoms are likely viral. You do not have evidence of pneumonia on X-ray. I will give you a prescription for Mucinex (expectorant) and Tessalon (cough suppressant) to take as needed. You may try nasal saline spray to help with your nasal congestion. - Your abdominal pain is chronic. Your labs and urine test today are unremarkable. Please call [facility] to schedule a follow-up appointment for ongoing management and further evaluation. In the meantime I will give you a prescription for Bentyl to take as needed for abdominal pain. - Your knee pain is also chronic. I gave you a prescription for Naproxen to take as needed for pain and inflammation. Please follow-up with [facility].  Take medications as prescribed. Return to the emergency room for worsening condition or new concerning symptoms. Follow up with your regular doctor. If you don't have a regular doctor use one of the numbers below to establish a primary care doctor.   Emergency Department Resource Guide 1) Find a Doctor and Pay Out of Pocket Although you won't have to find out who is covered by your insurance plan, it is a good idea to ask around and get recommendations. You will then need to call the office and see if the doctor you have chosen will accept you as a new patient and what types of options they offer for patients who are self-pay. Some doctors offer discounts or will set up payment plans for their patients who do not have insurance, but you will need to ask so you aren't surprised when you get to your appointment.  2) Contact Your Local Health Department Not all health departments have doctors that can see patients for sick visits, but many do, so it is worth a call to see if yours does. If you don't know where your local health department is, you can check in your phone book. The CDC also has a tool to help you locate your state's health department, and many state websites also have listings of  all of their local health departments.  3) Find a [facility] If your illness is not likely to be very severe or complicated, you may want to try a walk in clinic. These are popping up all over the country in pharmacies, drugstores, and shopping centers. They're usually staffed by nurse practitioners or physician assistants that have been trained to treat common illnesses and complaints. They're usually fairly quick and inexpensive. However, if you have serious medical issues or chronic medical problems, these are probably not your best option.  No Primary Care Doctor: - Call Health Connect at  [phone] - they can help you locate a primary care doctor that  accepts your insurance, provides certain services, etc. - Physician Referral [phone]  Emergency Department Resource Guide 1) Find a Doctor and Pay Out of Pocket Although you won't have to find out who is covered by your insurance plan, it is a good idea to ask around and get recommendations. You will then need to call the office and see if the doctor you have chosen will accept you as a new patient and what types of options they offer for patients who are self-pay. Some doctors offer discounts or will set up payment plans for their patients who do not have insurance, but you will need to ask so you aren't surprised when you get to your appointment.  2) Contact Your Local Health Department Not all health departments have doctors that can see patients for sick visits, but  many do, so it is worth a call to see if yours does. If you don't know where your local health department is, you can check in your phone book. The CDC also has a tool to help you locate your state's health department, and many state websites also have listings of all of their local health departments.  3) Find a [facility] If your illness is not likely to be very severe or complicated, you may want to try a walk in clinic. These are popping up all over the  country in pharmacies, drugstores, and shopping centers. They're usually staffed by nurse practitioners or physician assistants that have been trained to treat common illnesses and complaints. They're usually fairly quick and inexpensive. However, if you have serious medical issues or chronic medical problems, these are probably not your best option.  No Primary Care Doctor: - Call Health Connect at  [phone] - they can help you locate a primary care doctor that  accepts your insurance, provides certain services, etc. - Physician Referral Service- [phone]  Chronic Pain Problems: Organization         Address  Phone   Notes  [facility]  [phone] Patients need to be referred by their primary care doctor.   Medication Assistance: Organization         Address  Phone   Notes  [facility] Medication [facility] [address] [place], [place] [address] [phone] --Must be a resident of [facility] -- Must have NO insurance coverage whatsoever (no Medicaid/ Medicare, etc.) -- The pt. MUST have a primary care doctor that directs their care regularly and follows them in the community   [facility]  [phone]   [employer]  [phone]    Agencies that provide inexpensive medical care: Organization         Address  Phone   Notes  [place]  [phone]   [facility]    [phone]   [facility] [place], [place] [address] [phone]   [place] [address], [place] [phone]   [facility]    [phone]   [facility]    [phone]   [place] and [place] [address], [place] Phone:  [phone], Fax:  [phone] Hours of Operation:  9 am - 6 pm, M-F.  Also accepts Medicaid/Medicare and self-pay.  [facility] for [place] [address], [address], [place] Phone: [phone], Fax: [phone]. Hours of Operation:  8:30 am - 5:30 pm, M-F.  Also accepts Medicaid and self-pay.  [facility] [place] [address] [place] Phone: [phone]   [place], [place], [place] [phone], Ext. [phone] Mondays & Thursdays: 7-9 AM.  First 15 patients are seen on a first come, first serve basis.    [place] Providers:  Organization         Address  Phone   Notes  [facility] [address], [place] [phone] Also accepts self-pay patients.  [place], [place]  [phone]   [place], [address], [place] [phone]   [place] [address], [place] [phone]   Myra Rude  [place] [address], [place]   [phone] Only accepts [place] patients after they have their name applied to their card.   Self-Pay (no insurance) in [facility]:  Organization         Address  Phone   Notes  Sickle Cell Patients, [facility] [place] [phone]   [facility] [place] [phone]   [facility] [place]  [place], [place], [place] [phone]   [facility]/Dr. Osei-Bonsu  [address] [place] or [address], [place] [phone] Phone number for both [place] and [place] locations is the same.  [facility] [address] [place] [phone]   [facility] [address], [place] or [address] [phone]   [facility] [address] [place] [phone], phone; [phone], fax Sees patients 1st and 3rd Saturday of every month.  Must  not qualify for public or private insurance (i.e. Medicaid, Medicare, [place] Health Choice, Veterans' Benefits)  Household income should be no more than 200% of the poverty level The clinic cannot treat you if you are pregnant or think you are pregnant  Sexually transmitted diseases are not treated at the clinic.

## 2015-11-13 NOTE — ED Provider Notes (Signed)
CSN: [id number]     Arrival date & time 11/13/15  1111 History   First MD Initiated Contact with Patient 11/13/15 1531     Chief Complaint  Patient presents with  . Nasal Congestion  . Cough  . Flank Pain    The history is provided by the patient. The history is limited by a language barrier. A language interpreter was used.    Dawn Espinoza is an 54 y.o. female with history of HTN who presents to the ED for evaluation of URI and flank pain. Regarding her URI, she states that she began experiencing cough, nasal congestion, and myalgia two days ago. She is unsure if she has had fever at home. She states that he cough is at times productive of a clear-yellow sputum. Denies hemoptysis. Denies abdominal pain, N/V/D. Pt is also complaining of right sided flank pain and RUQ pain. She states that she has had this pain for years. She reports she does have known liver cysts. Abd [place] from 08/2015 does show hepatic steatosis and a 2cm right lobe cyst. Pt reports associated hard, painful BM. She states that this pain and constipation has been going on for years. She denies N/V. Denies dysuria, urinary frequency/urgency, hematuria. Denies tobacco, EtOH use. She does not follow with GI.    Past Medical History  Diagnosis Date  . Hypertension    No past surgical history on file. Family History  Problem Relation Age of Onset  . Alcohol abuse Father   . Heart disease Father    Social History  Substance Use Topics  . Smoking status: Never Smoker   . Smokeless tobacco: None  . Alcohol Use: No   OB History    Gravida Para Term Preterm AB TAB SAB Ectopic Multiple Living   0 0 0 0 0 0 0 0       Review of Systems  All other systems reviewed and are negative.     Allergies  Review of patient's allergies indicates no known allergies.  Home Medications   Prior to Admission medications   Medication Sig Start Date End Date Taking? Authorizing Provider  amoxicillin-clavulanate (AUGMENTIN) 875-125 MG  tablet Take 1 tablet by mouth 2 (two) times daily. 09/09/15   Roselee Culver, MD  chlorpheniramine-HYDROcodone (TUSSIONEX PENNKINETIC ER) 10-8 MG/5ML SUER Take 5 mLs by mouth 2 (two) times daily. 09/09/15   Roselee Culver, MD  fenofibrate 54 MG tablet Take 1 tablet (54 mg total) by mouth daily. 09/14/15   Jaynee Eagles, PA-C  hydrochlorothiazide (HYDRODIURIL) 25 MG tablet Take 1 tablet (25 mg total) by mouth daily. 09/09/15   Roselee Culver, MD  pseudoephedrine-guaifenesin (MUCINEX D) 60-600 MG 12 hr tablet Take 1 tablet by mouth every 12 (twelve) hours. 09/09/15 09/08/16  Roselee Culver, MD  traMADol (ULTRAM) 50 MG tablet Take by mouth every 6 (six) hours as needed.    Historical Provider, MD   BP 132/64 mmHg  Pulse 84  Temp(Src) 98.7 F (37.1 C) (Oral)  Resp 18  SpO2 100% Physical Exam  Constitutional: She is oriented to person, place, and time. No distress.  HENT:  Right Ear: External ear normal.  Left Ear: External ear normal.  Nose: Mucosal edema and rhinorrhea present.  Mouth/Throat: Oropharynx is clear and moist and mucous membranes are normal. No oropharyngeal exudate or posterior oropharyngeal erythema.  Eyes: Conjunctivae and EOM are normal. Pupils are equal, round, and reactive to light.  Neck: Normal range of motion. Neck supple.  Cardiovascular:  Normal rate, regular rhythm, normal heart sounds and intact distal pulses.   Pulmonary/Chest: Effort normal and breath sounds normal. No respiratory distress. She has no wheezes. She exhibits no tenderness.  Abdominal: Soft. Bowel sounds are normal. She exhibits no distension. There is no tenderness. There is no rebound, no guarding and no CVA tenderness.  Musculoskeletal: Normal range of motion. She exhibits no edema or tenderness.  Neurological: She is alert and oriented to person, place, and time. No cranial nerve deficit.  Skin: Skin is warm and dry. She is not diaphoretic.  Psychiatric: She has a normal mood and  affect.  Nursing note and vitals reviewed.   ED Course  Procedures (including critical care time) Labs Review Labs Reviewed  URINALYSIS, ROUTINE W REFLEX MICROSCOPIC (NOT AT [facility]) - Abnormal; Notable for the following:    Hgb urine dipstick SMALL (*)    Leukocytes, UA SMALL (*)    All other components within normal limits  COMPREHENSIVE METABOLIC PANEL - Abnormal; Notable for the following:    Glucose, Bld 107 (*)    Total Bilirubin 0.2 (*)    All other components within normal limits  URINE MICROSCOPIC-ADD ON - Abnormal; Notable for the following:    Squamous Epithelial / LPF 6-30 (*)    All other components within normal limits  CBC WITH DIFFERENTIAL/PLATELET  LIPASE, BLOOD    Imaging Review Dg Chest 2 View  11/13/2015  CLINICAL DATA:  Nasal congestion and cough for 2 days. Right-sided flank pain. EXAM: CHEST - 2 VIEW COMPARISON:  Two-view chest x-ray 01/15/2006 FINDINGS: Heart size is normal. Mild atherosclerotic calcifications are present in the aorta. There is no significant airspace disease. No edema or effusion is present to suggest failure. The visualized soft tissues and bony thorax are unremarkable. IMPRESSION: No active disease. Electronically Signed   By: San Morelle M.D.   On: 11/13/2015 16:26   I have personally reviewed and evaluated these images and lab results as part of my medical decision-making.   EKG Interpretation None      MDM   Final diagnoses:  URI (upper respiratory infection)  Viral syndrome  Chronic abdominal pain  Chronic knee pain, unspecified laterality    Regarding congestion, cough, and myalgias I suspect pt has viral syndrome. CXR is negative, she has no adventitious lung sounds, she is afebrile and not tachycardic. Will give rx for mucinex and tessalon and pt may use nasal saline spray for congestion. Regarding her abdominal pain, this appears to be chronic. I discussed with pt that her labs are unremarkable and she has no  abdominal findings on exam warranting repeat CT today. Her labs are unremarkable. She has no tenderness, rebound, guarding; her abdomen is soft. I will give rx for Bentyl and referral to GI for f/u of chronic abdominal pain and liver cyst. Pt is also requesting medication for her chronic knee pain 2/2 osteoarthritis. She normally sees [facility] for her knee pain. Will give rx for naproxen and encouraged ortho f/u. ER return precautions given.     Anne Ng, PA-C 11/14/15 [id number]  Charlesetta Shanks, MD 11/25/15 1005

## 2015-11-13 NOTE — Progress Notes (Signed)
Pt does not speak english Interpreter needed Confirmed she does not have a pcp

## 2015-11-13 NOTE — ED Notes (Signed)
Pt states that she has been having nasal congestion and cough x 2 days.  Also c/o rt sided flank pain x several years.

## 2016-02-27 ENCOUNTER — Ambulatory Visit (INDEPENDENT_AMBULATORY_CARE_PROVIDER_SITE_OTHER): Payer: BLUE CROSS/BLUE SHIELD | Admitting: Urgent Care

## 2016-02-27 ENCOUNTER — Ambulatory Visit (INDEPENDENT_AMBULATORY_CARE_PROVIDER_SITE_OTHER): Payer: BLUE CROSS/BLUE SHIELD

## 2016-02-27 VITALS — BP 136/82 | HR 70 | Temp 98.0°F | Resp 18 | Ht 61.0 in | Wt 163.0 lb

## 2016-02-27 DIAGNOSIS — H1132 Conjunctival hemorrhage, left eye: Secondary | ICD-10-CM

## 2016-02-27 DIAGNOSIS — R1011 Right upper quadrant pain: Secondary | ICD-10-CM | POA: Diagnosis not present

## 2016-02-27 DIAGNOSIS — I10 Essential (primary) hypertension: Secondary | ICD-10-CM

## 2016-02-27 DIAGNOSIS — K760 Fatty (change of) liver, not elsewhere classified: Secondary | ICD-10-CM

## 2016-02-27 DIAGNOSIS — B351 Tinea unguium: Secondary | ICD-10-CM | POA: Diagnosis not present

## 2016-02-27 DIAGNOSIS — M1712 Unilateral primary osteoarthritis, left knee: Secondary | ICD-10-CM

## 2016-02-27 DIAGNOSIS — H1013 Acute atopic conjunctivitis, bilateral: Secondary | ICD-10-CM | POA: Diagnosis not present

## 2016-02-27 DIAGNOSIS — R1084 Generalized abdominal pain: Secondary | ICD-10-CM

## 2016-02-27 DIAGNOSIS — M179 Osteoarthritis of knee, unspecified: Secondary | ICD-10-CM

## 2016-02-27 DIAGNOSIS — R109 Unspecified abdominal pain: Secondary | ICD-10-CM

## 2016-02-27 DIAGNOSIS — K7689 Other specified diseases of liver: Secondary | ICD-10-CM | POA: Diagnosis not present

## 2016-02-27 LAB — POCT CBC
Granulocyte percent: 55.7 %G (ref 37–80)
HCT, POC: 36.1 % — AB (ref 37.7–47.9)
Hemoglobin: 12.7 g/dL (ref 12.2–16.2)
Lymph, poc: 2.8 (ref 0.6–3.4)
MCH, POC: 31.6 pg — AB (ref 27–31.2)
MCHC: 35.3 g/dL (ref 31.8–35.4)
MCV: 89.4 fL (ref 80–97)
MID (cbc): 0.6 (ref 0–0.9)
MPV: 7 fL (ref 0–99.8)
POC Granulocyte: 4.2 (ref 2–6.9)
POC LYMPH PERCENT: 36.8 %L (ref 10–50)
POC MID %: 7.5 %M (ref 0–12)
Platelet Count, POC: 245 10*3/uL (ref 142–424)
RBC: 4.04 M/uL (ref 4.04–5.48)
RDW, POC: 13.2 %
WBC: 7.5 10*3/uL (ref 4.6–10.2)

## 2016-02-27 LAB — COMPREHENSIVE METABOLIC PANEL
ALT: 25 U/L (ref 6–29)
AST: 23 U/L (ref 10–35)
Albumin: 4 g/dL (ref 3.6–5.1)
Alkaline Phosphatase: 70 U/L (ref 33–130)
BUN: 11 mg/dL (ref 7–25)
CO2: 28 mmol/L (ref 20–31)
Calcium: 9 mg/dL (ref 8.6–10.4)
Chloride: 104 mmol/L (ref 98–110)
Creat: 0.78 mg/dL (ref 0.50–1.05)
Glucose, Bld: 127 mg/dL — ABNORMAL HIGH (ref 65–99)
Potassium: 4.3 mmol/L (ref 3.5–5.3)
Sodium: 139 mmol/L (ref 135–146)
Total Bilirubin: 0.3 mg/dL (ref 0.2–1.2)
Total Protein: 6.9 g/dL (ref 6.1–8.1)

## 2016-02-27 MED ORDER — HYDROCHLOROTHIAZIDE 25 MG PO TABS: 25.0000 mg | ORAL_TABLET | Freq: Every day | ORAL | Status: DC

## 2016-02-27 MED ORDER — AZELASTINE HCL 0.05 % OP SOLN: 1.0000 [drp] | Freq: Two times a day (BID) | OPHTHALMIC | Status: DC

## 2016-02-27 MED ORDER — POLYETHYLENE GLYCOL 3350 17 GM/SCOOP PO POWD: 17.0000 g | Freq: Every day | ORAL | Status: DC | PRN

## 2016-02-27 MED ORDER — DICLOFENAC SODIUM 1 % TD GEL: TRANSDERMAL | Status: DC

## 2016-02-27 MED ORDER — TERBINAFINE HCL 250 MG PO TABS: 250.0000 mg | ORAL_TABLET | Freq: Every day | ORAL | Status: DC

## 2016-02-27 MED ORDER — DOCUSATE SODIUM 50 MG PO CAPS: 50.0000 mg | ORAL_CAPSULE | Freq: Two times a day (BID) | ORAL | Status: DC

## 2016-02-27 NOTE — Patient Instructions (Signed)
Docusate - Tome 1 pastilla dos veces al dia por 1 semana. Luego, tome 1 pastilla diaramente por las [employer]. La tersera semana, tome 1 pastilla cada otro dia.   Miralax - Puede usar esta solucion 1 ves cada otro dia si lo necessita.    Conjuntivitis alrgica (Allergic Conjunctivitis) La conjuntivitis alrgica es la inflamacin de la membrana transparente que cubre la parte blanca del ojo y la cara interna del prpado (conjuntiva), y su causa son las Set designer. Los vasos sanguneos de la conjuntiva se inflaman, lo que hace que el ojo se torne de color rojo o rosa, y a menudo causa picazn en el ojo. La conjuntivitis alrgica no se transmite de [place] persona a la otra (no es contagiosa). CAUSAS La causa de esta afeccin es una reaccin alrgica. Entre las causas comunes de una reaccin IT consultant (alrgenos) se incluyen las siguientes:  Polvo.  Polen.  Moho.  Caspa o secreciones de los [place]. FACTORES DE RIESGO Es ms probable que aparezca esta afeccin si est expuesto a altos niveles de los alrgenos que causan la Risk analyst. Esto puede incluir estar al aire libre cuando los niveles de polen en el aire son elevados o cerca de los animales a los cuales es Air cabin crew. SNTOMAS Los sntomas de esta afeccin pueden incluir lo siguiente:  Enrojecimiento ocular.  Secrecin lagrimal de los ojos.  Ojos llorosos.  Picazn de los ojos.  Sensacin de ardor en los ojos.  Secrecin transparente de los ojos.  Hinchazn de los prpados. DIAGNSTICO Este trastorno se puede diagnosticar mediante la historia clnica y un examen fsico. Si tiene secrecin de los ojos, se la puede Physiological scientist para descartar otras causas de la conjuntivitis. TRATAMIENTO El tratamiento para esta afeccin suele incluir medicamentos, que pueden ser gotas oftlmicas, ungentos o medicamentos por va oral. Pueden ser recetados o de venta [place]. Menno o aplquese los  medicamentos solamente como se lo haya indicado el mdico.  No se toque ni se frote los ojos.  No use lentes de contacto hasta que la inflamacin haya desaparecido. En cambio, use anteojos.  No use maquillaje en los ojos hasta que la inflamacin haya desaparecido.  Aplquese un pao limpio y fro en el ojo durante 10a 42minutos, 3 a 4veces por da.  Trate de evitar el alrgeno que le est causando la Risk analyst. SOLICITE ATENCIN MDICA SI:  Los sntomas empeoran.  Le supura pus del ojo.  Aparecen nuevos sntomas.  Tiene fiebre.   Esta informacin no tiene Marine scientist el consejo del mdico. Asegrese de hacerle al mdico cualquier pregunta que tenga.   Document Released: 11/04/2005 Document Revised: 11/25/2014 Elsevier Interactive Patient Education 2016 Butts subconjuntival (Subconjunctival Hemorrhage) La hemorragia subconjuntival es el sangrado que se produce entre la parte blanca del ojo (esclertica) y la membrana transparente que recubre la parte externa de este rgano (conjuntiva). Cerca de la superficie del ojo hay muchos vasos sanguneos diminutos. La hemorragia subconjuntival ocurre cuando uno o ms de estos vasos sanguneos se rompen y Control and instrumentation engineer, lo que deriva en la aparicin de una mancha roja en el ojo. Esta es similar a un hematoma. En funcin de la magnitud del sangrado, la mancha roja puede cubrir nicamente una pequea zona del ojo o la totalidad de la parte visible de Materials engineer. Si se acumula mucha sangre debajo de la conjuntiva, tambin puede haber inflamacin. Las hemorragias subconjuntivales no afectan la visin ni Financial risk analyst, Armed forces training and education officer  puede haber sensacin de irritacin ocular si hay inflamacin. Generalmente, las hemorragias subconjuntivales no requieren tratamiento y Engineering geologist en el trmino de [employer]. CAUSAS Esta afeccin puede ser causada por lo siguiente:  Un traumatismo leve, como frotarse los ojos con  mucha fuerza.  Un traumatismo grave o una contusin.  Toser, estornudar o vomitar.  Realizar esfuerzos, como ocurre al levantar un objeto pesado.  Hipertensin arterial.  Ardelia Mems ciruga ocular reciente.  Antecedentes de diabetes.  Algunos medicamentos, especialmente los anticoagulantes.  Otras afecciones, como los tumores en los ojos, los trastornos hemorrgicos o las anomalas de los vasos sanguneos. Las hemorragias subconjuntivales pueden producirse sin una causa aparente.  SNTOMAS  Los sntomas de esta afeccin incluyen lo siguiente:  Etta Grandchild de color rojo oscuro o brillante en la parte blanca del ojo.  La zona enrojecida se puede extender hasta cubrir un rea ms grande del ojo antes de Armed forces operational officer.  La zona enrojecida puede tornarse de color marrn amarillento antes de desaparecer.  Hinchazn.  Irritacin leve del ojo. DIAGNSTICO Esta afeccin se diagnostica mediante un examen fsico. Si la hemorragia subconjuntival fue causada por un traumatismo, el mdico puede derivarlo a un oculista (oftalmlogo) o a Dietitian para que lo examinen en busca de otras lesiones. Pueden hacerle otros estudios, por ejemplo:  Un examen ocular.  Un control de la presin arterial.  Anlisis de sangre para detectar la presencia de trastornos hemorrgicos. Si la hemorragia subconjuntival fue causada por un traumatismo, pueden hacerle radiografas o una tomografa computarizada (TC) para determinar si hay otras lesiones. TRATAMIENTO Por lo general, no se necesita tratamiento. El mdico puede recomendarle que se aplique gotas oftlmicas o compresas fras para Public house manager las [place]. INSTRUCCIONES PARA EL CUIDADO EN EL HOGAR  Tome los medicamentos de venta libre y los recetados solamente como se lo haya indicado el mdico.  Aplique las gotas oftlmicas o las compresas fras para Public house manager las molestias como se lo haya indicado el [place] cosas y los  entornos que pueden causarle irritacin o lesiones en el ojo.  Concurra a todas las visitas de control como se lo haya indicado el mdico. Esto es importante. SOLICITE ATENCIN MDICA SI:  Siente dolor en el ojo.  El sangrado no desaparece en el trmino de 3semanas.  Sigue teniendo hemorragias subconjuntivales. SOLICITE ATENCIN MDICA DE INMEDIATO SI:  Tiene cambios en la visin o dificultad para ver.  Repentinamente, tiene mucha sensibilidad a la luz.  Tiene dolor de cabeza intenso, vmitos persistentes, confusin o un cansancio que no es normal (letargia).  Parece que el ojo sobresale o se protruye de la rbita.  Le aparecen hematomas en el cuerpo sin motivo.  Tiene sangrado en otra parte del cuerpo sin motivo.   Esta informacin no tiene Marine scientist el consejo del mdico. Asegrese de hacerle al mdico cualquier pregunta que tenga.   Document Released: 08/14/2005 Document Revised: 07/26/2015 Elsevier Interactive Patient Education [employer].

## 2016-02-27 NOTE — Progress Notes (Signed)
MRN: [id number] DOB: [birth year]  Subjective:   Dawn Espinoza is a 55 y.o. female presenting for chief complaint of Conjunctivitis and Abdominal Pain  Left eye - Reports 1 day history of left eye pain, redness, has foreign body sensation, has itching and stinging at times of both eyes. Also has associated mild headache. Has not tried any medications for her eye. Denies eye trauma, discharge, light sensitivity, congestion, sinus pain, ear pain, fever.  Abdominal pain - Reports longstanding RUQ pain. Has been seen here before for the same. She admits taking Tramadol every day for left knee pain secondary to OA. She takes 1 - 2 doses of Tramadol. Denies constipation, n/v, abdominal pain, diarrhea, bloody stools. Has a history of hepatic cyst, hepatic steatosis as seen on U/S 08/2015.  HTN - managed with HCTZ. Denies lightheadedness, dizziness, chronic headache, double vision, chest pain, shortness of breath, heart racing, palpitations, nausea, vomiting, abdominal pain, hematuria, lower leg swelling.   Finger Issue - Reports overgrowth of her finger nails for the past several weeks. They generally do not bother her including itching, pain, drainage. She has not tried any medications for this.  [name] has a current medication list which includes the following prescription(s): hydrochlorothiazide and tramadol. Also has No Known Allergies.  [name]  has a past medical history of Hypertension. Also  has no past surgical history on file.  Objective:   Vitals: BP 136/82 mmHg  Pulse 70  Temp(Src) 98 F (36.7 C) (Oral)  Resp 18  Ht 5\' 1"  (1.549 m)  Wt 163 lb (73.936 kg)  BMI 30.81 kg/m2  SpO2 98%  Physical Exam  Constitutional: She is oriented to person, place, and time. She appears well-developed and well-nourished.  HENT:  Mouth/Throat: Oropharynx is clear and moist.  Eyes: Right eye exhibits no discharge. Left eye exhibits no discharge. No scleral icterus.  Neck: Normal range of motion.  Neck supple. No thyromegaly present.  Cardiovascular: Normal rate, regular rhythm and intact distal pulses.  Exam reveals no gallop and no friction rub.   No murmur heard. Pulmonary/Chest: No respiratory distress. She has no wheezes. She has no rales.  Abdominal: Soft. Bowel sounds are normal. She exhibits no distension and no mass. There is tenderness (generalized throughout, worse over RUQ).  Musculoskeletal: She exhibits no edema.  Lymphadenopathy:    She has no cervical adenopathy.  Neurological: She is alert and oriented to person, place, and time.  Skin: Skin is warm and dry.  Hyper-keratotic finger nails for her 1-3rd fingers bilaterally.   Results for orders placed or performed in visit on 02/27/16 (from the past 24 hour(s))  POCT CBC     Status: Abnormal   Collection Time: 02/27/16  2:04 PM  Result Value Ref Range   WBC 7.5 4.6 - 10.2 K/uL   Lymph, poc 2.8 0.6 - 3.4   POC LYMPH PERCENT 36.8 10 - 50 %L   MID (cbc) 0.6 0 - 0.9   POC MID % 7.5 0 - 12 %M   POC Granulocyte 4.2 2 - 6.9   Granulocyte percent 55.7 37 - 80 %G   RBC 4.04 4.04 - 5.48 M/uL   Hemoglobin 12.7 12.2 - 16.2 g/dL   HCT, POC 36.1 (A) 37.7 - 47.9 %   MCV 89.4 80 - 97 fL   MCH, POC 31.6 (A) 27 - 31.2 pg   MCHC 35.3 31.8 - 35.4 g/dL   RDW, POC 13.2 %   Platelet Count, POC 245 142 - 424  K/uL   MPV 7.0 0 - 99.8 fL   Dg Abd 1 View  02/27/2016  CLINICAL DATA:  Right upper quadrant abdominal pain. EXAM: ABDOMEN - 1 VIEW COMPARISON:  None. FINDINGS: Single supine view abdomen and pelvis. Non-obstructive bowel gas pattern. No abnormal abdominal calcifications. No appendicolith. Distal gas and stool. IMPRESSION: No acute findings. Electronically Signed   By: Abigail Miyamoto M.D.   On: 02/27/2016 14:36   Assessment and Plan :   1. RUQ pain 2. Generalized abdominal pain 3. Hepatic cyst 4. Hepatic steatosis - I suspect her belly pain is largely due to her use of Tramadol. I advised she stop this and start docusate and  Miralax as needed. She is to rtc in 3-4 weeks. If her problem persists, we will repeat her RUQ U/S.  5. Osteoarthritis of left knee, unspecified osteoarthritis type - Stop Tramadol due to adverse effect of constipation. Start diclofenac topically.   6. Subconjunctival hemorrhage, left 7. Allergic conjunctivitis, bilateral - Patient lifts heavy objects at work, she likely had a subconjunctival hemorrhage of her left eye with overlying allergic conjunctivitis. Anticipatory guidance provided, start Optivar for allergic conjunctivitis.  8. Essential hypertension - Controlled, refilled HCTZ for 6 months  9. Onychomycosis - Start Lamisil. Recheck LFTs in 6 weeks.  Jaynee Eagles, PA-C [facility] [phone] 02/27/2016 1:35 PM

## 2016-02-28 ENCOUNTER — Encounter: Payer: Self-pay | Admitting: Urgent Care

## 2016-03-01 ENCOUNTER — Telehealth: Payer: Self-pay

## 2016-03-01 NOTE — Telephone Encounter (Signed)
PA completed for diclofenac 1% gel on covermymeds. Pending. Pt has tried diclofenac tabs, ibuprofen, meloxicam and naproxen, as well as tramadol in the past.

## 2016-03-01 NOTE — Telephone Encounter (Signed)
PA approved through 11/17/2038. Notified pharm. 

## 2016-03-14 ENCOUNTER — Ambulatory Visit: Payer: BLUE CROSS/BLUE SHIELD | Admitting: Urgent Care

## 2016-06-07 ENCOUNTER — Other Ambulatory Visit: Payer: Self-pay | Admitting: Urgent Care

## 2016-08-13 ENCOUNTER — Other Ambulatory Visit: Payer: Self-pay | Admitting: Urgent Care

## 2016-08-13 DIAGNOSIS — I10 Essential (primary) hypertension: Secondary | ICD-10-CM

## 2016-08-15 NOTE — Telephone Encounter (Signed)
Pt needs OV prior to further refills 

## 2016-09-25 ENCOUNTER — Ambulatory Visit (HOSPITAL_COMMUNITY)
Admission: RE | Admit: 2016-09-25 | Discharge: 2016-09-25 | Disposition: A | Discharge status: Home / Self Care | Payer: BLUE CROSS/BLUE SHIELD | Source: Ambulatory Visit | Attending: Urgent Care | Admitting: Urgent Care

## 2016-09-25 ENCOUNTER — Ambulatory Visit (INDEPENDENT_AMBULATORY_CARE_PROVIDER_SITE_OTHER): Payer: BLUE CROSS/BLUE SHIELD | Admitting: Urgent Care

## 2016-09-25 VITALS — BP 110/66 | HR 74 | Temp 98.3°F | Resp 18 | Ht 61.0 in | Wt 166.0 lb

## 2016-09-25 DIAGNOSIS — K760 Fatty (change of) liver, not elsewhere classified: Secondary | ICD-10-CM | POA: Diagnosis not present

## 2016-09-25 DIAGNOSIS — R109 Unspecified abdominal pain: Secondary | ICD-10-CM

## 2016-09-25 DIAGNOSIS — M1712 Unilateral primary osteoarthritis, left knee: Secondary | ICD-10-CM

## 2016-09-25 DIAGNOSIS — K7689 Other specified diseases of liver: Secondary | ICD-10-CM | POA: Insufficient documentation

## 2016-09-25 DIAGNOSIS — G8929 Other chronic pain: Secondary | ICD-10-CM

## 2016-09-25 DIAGNOSIS — M25561 Pain in right knee: Secondary | ICD-10-CM

## 2016-09-25 LAB — POCT CBC
Granulocyte percent: 52.7 %G (ref 37–80)
HCT, POC: 39.9 % (ref 37.7–47.9)
Hemoglobin: 13.8 g/dL (ref 12.2–16.2)
Lymph, poc: 3.2 (ref 0.6–3.4)
MCH, POC: 30.7 pg (ref 27–31.2)
MCHC: 34.5 g/dL (ref 31.8–35.4)
MCV: 88.9 fL (ref 80–97)
MID (cbc): 0.5 (ref 0–0.9)
MPV: 7.3 fL (ref 0–99.8)
POC Granulocyte: 4.1 (ref 2–6.9)
POC LYMPH PERCENT: 41.3 %L (ref 10–50)
POC MID %: 6 %M (ref 0–12)
Platelet Count, POC: 247 10*3/uL (ref 142–424)
RBC: 4.49 M/uL (ref 4.04–5.48)
RDW, POC: 13.1 %
WBC: 7.8 10*3/uL (ref 4.6–10.2)

## 2016-09-25 LAB — POCT URINALYSIS DIP (MANUAL ENTRY)
Bilirubin, UA: NEGATIVE
Glucose, UA: NEGATIVE
Ketones, POC UA: NEGATIVE
Nitrite, UA: NEGATIVE
Protein Ur, POC: NEGATIVE
Spec Grav, UA: <=1.005
Urobilinogen, UA: 0.2
pH, UA: 6

## 2016-09-25 MED ORDER — NAPROXEN SODIUM 550 MG PO TABS: 550.0000 mg | ORAL_TABLET | Freq: Two times a day (BID) | ORAL | 1 refills | Status: DC

## 2016-09-25 MED ORDER — IOPAMIDOL (ISOVUE-300) INJECTION 61%: 30.0000 mL | Freq: Once | INTRAVENOUS | Status: DC | PRN

## 2016-09-25 MED ORDER — IOPAMIDOL (ISOVUE-300) INJECTION 61%
100.0000 mL | Freq: Once | INTRAVENOUS | Status: DC | PRN
Start: 2016-09-25 — End: 2016-09-26

## 2016-09-25 NOTE — Patient Instructions (Addendum)
  GO TO [facility] NOW FOR CT SCAN. Dawn Espinoza  [phone] NO PA FOR BCBS   IF you received an x-ray today, you will receive an invoice from [facility]. Please contact [facility] at [phone] with questions or concerns regarding your invoice.   IF you received labwork today, you will receive an invoice from [employer]. Please contact [facility] at [phone] with questions or concerns regarding your invoice.   Our billing staff will not be able to assist you with questions regarding bills from these companies.  You will be contacted with the lab results as soon as they are available. The fastest way to get your results is to activate your My Chart account. Instructions are located on the last page of this paperwork. If you have not heard from [place] regarding the results in 2 weeks, please contact this office.

## 2016-09-25 NOTE — Progress Notes (Signed)
MRN: [id number] DOB: June 12, 1961  Subjective:   Dawn Espinoza is a 55 y.o. female presenting for chief complaint of Flank Pain (RIGHT SIDE)  Reports 1 week history of recurrent and worsening right sided belly pain. Has associated nausea without vomiting. She was seen at another practice recently for the same, started on Bactrim. She reports no improvement in her belly pain, states that she received a call reporting normal blood work. Denies fever, dysuria, hematuria, flank pain, constipation. She was previously using Tramadol for knee pain, caused constipation. She has since stopped this and has been using diclofenac gel for her knee arthritis. However, her pain has worsened in the past week as well. Diclofenac was helping but she would prefer something to help with her current knee pain. Denies trauma, swelling, redness.   Dawn Espinoza has a current medication list which includes the following prescription(s): diclofenac sodium, fenofibrate, hydrochlorothiazide, sulfamethoxazole-trimethoprim, terbinafine, and tramadol. Also has No Known Allergies.  Dawn Espinoza  has a past medical history of Hypertension. Also  has no past surgical history on file.   Objective:   Vitals: BP 110/66 (BP Location: Right Arm, Patient Position: Sitting, Cuff Size: Small)   Pulse 74   Temp 98.3 F (36.8 C) (Oral)   Resp 18   Ht 5\' 1"  (1.549 m)   Wt 166 lb (75.3 kg)   SpO2 99%   BMI 31.37 kg/m   Physical Exam  Constitutional: She is oriented to person, place, and time. She appears well-developed and well-nourished.  HENT:  Mouth/Throat: Oropharynx is clear and moist.  Eyes: No scleral icterus.  Cardiovascular: Normal rate, regular rhythm and intact distal pulses.  Exam reveals no gallop and no friction rub.   No murmur heard. Pulmonary/Chest: No respiratory distress. She has no wheezes. She has no rales.  Abdominal: Soft. Bowel sounds are normal. She exhibits no distension and no mass. There is tenderness (right  sided).  Neurological: She is alert and oriented to person, place, and time.  Skin: Skin is warm and dry.   Results for orders placed or performed in visit on 09/25/16 (from the past 24 hour(s))  POCT CBC     Status: None   Collection Time: 09/25/16  3:40 PM  Result Value Ref Range   WBC 7.8 4.6 - 10.2 K/uL   Lymph, poc 3.2 0.6 - 3.4   POC LYMPH PERCENT 41.3 10 - 50 %L   MID (cbc) 0.5 0 - 0.9   POC MID % 6.0 0 - 12 %M   POC Granulocyte 4.1 2 - 6.9   Granulocyte percent 52.7 37 - 80 %G   RBC 4.49 4.04 - 5.48 M/uL   Hemoglobin 13.8 12.2 - 16.2 g/dL   HCT, POC 39.9 37.7 - 47.9 %   MCV 88.9 80 - 97 fL   MCH, POC 30.7 27 - 31.2 pg   MCHC 34.5 31.8 - 35.4 g/dL   RDW, POC 13.1 %   Platelet Count, POC 247 142 - 424 K/uL   MPV 7.3 0 - 99.8 fL  POCT urinalysis dipstick     Status: Abnormal   Collection Time: 09/25/16  3:42 PM  Result Value Ref Range   Color, UA yellow yellow   Clarity, UA clear clear   Glucose, UA negative negative   Bilirubin, UA negative negative   Ketones, POC UA negative negative   Spec Grav, UA <=1.005    Blood, UA small (A) negative   pH, UA 6.0    Protein Ur, POC  negative negative   Urobilinogen, UA 0.2    Nitrite, UA Negative Negative   Leukocytes, UA Trace (A) Negative    Assessment and Plan :   1. Right sided abdominal pain 2. Liver cyst - CT abdomen/pelvis is pending. We will discuss results, f/u thereafter. Patient plans on scheduling f/u for her blood pressure refills.  3. Osteoarthritis of left knee, unspecified osteoarthritis type 4. Chronic pain of right knee - Stable, start Anaprox. Will consider knee injection. I did not have time to evaluate and exam her due to the STAT CT we ordered. Patient will rtc for f/u of this and her med refills.  Jaynee Eagles, PA-C [facility] [phone] 09/25/2016 3:13 PM

## 2016-09-30 ENCOUNTER — Ambulatory Visit: Payer: BLUE CROSS/BLUE SHIELD

## 2016-10-08 ENCOUNTER — Ambulatory Visit (INDEPENDENT_AMBULATORY_CARE_PROVIDER_SITE_OTHER): Payer: BLUE CROSS/BLUE SHIELD | Admitting: Urgent Care

## 2016-10-08 VITALS — BP 142/82 | HR 84 | Temp 98.4°F | Resp 17 | Ht 61.0 in | Wt 165.0 lb

## 2016-10-08 DIAGNOSIS — R1011 Right upper quadrant pain: Secondary | ICD-10-CM | POA: Diagnosis not present

## 2016-10-08 DIAGNOSIS — I10 Essential (primary) hypertension: Secondary | ICD-10-CM | POA: Diagnosis not present

## 2016-10-08 DIAGNOSIS — E785 Hyperlipidemia, unspecified: Secondary | ICD-10-CM | POA: Diagnosis not present

## 2016-10-08 DIAGNOSIS — K7689 Other specified diseases of liver: Secondary | ICD-10-CM

## 2016-10-08 DIAGNOSIS — R030 Elevated blood-pressure reading, without diagnosis of hypertension: Secondary | ICD-10-CM | POA: Diagnosis not present

## 2016-10-08 LAB — COMPLETE METABOLIC PANEL WITH GFR
ALT: 40 U/L — ABNORMAL HIGH (ref 6–29)
AST: 31 U/L (ref 10–35)
Albumin: 4.4 g/dL (ref 3.6–5.1)
Alkaline Phosphatase: 73 U/L (ref 33–130)
BUN: 18 mg/dL (ref 7–25)
CO2: 27 mmol/L (ref 20–31)
Calcium: 9.3 mg/dL (ref 8.6–10.4)
Chloride: 103 mmol/L (ref 98–110)
Creat: 0.84 mg/dL (ref 0.50–1.05)
GFR, Est African American: >89 mL/min (ref >=60)
GFR, Est Non African American: 78 mL/min (ref >=60)
Glucose, Bld: 117 mg/dL — ABNORMAL HIGH (ref 65–99)
Potassium: 3.7 mmol/L (ref 3.5–5.3)
Sodium: 138 mmol/L (ref 135–146)
Total Bilirubin: 0.2 mg/dL (ref 0.2–1.2)
Total Protein: 7.3 g/dL (ref 6.1–8.1)

## 2016-10-08 LAB — LIPID PANEL
Cholesterol: 242 mg/dL — ABNORMAL HIGH (ref <200)
HDL: 51 mg/dL (ref >50)
LDL Cholesterol: 124 mg/dL — ABNORMAL HIGH (ref <100)
Total CHOL/HDL Ratio: 4.7 Ratio (ref <5.0)
Triglycerides: 337 mg/dL — ABNORMAL HIGH (ref <150)
VLDL: 67 mg/dL — ABNORMAL HIGH (ref <30)

## 2016-10-08 MED ORDER — FENOFIBRATE 54 MG PO TABS: 54.0000 mg | ORAL_TABLET | Freq: Every day | ORAL | 1 refills | Status: DC

## 2016-10-08 MED ORDER — LISINOPRIL 10 MG PO TABS: 10.0000 mg | ORAL_TABLET | Freq: Every day | ORAL | 1 refills | Status: DC

## 2016-10-08 MED ORDER — ACETAMINOPHEN-CODEINE #3 300-30 MG PO TABS: 1.0000 | ORAL_TABLET | Freq: Four times a day (QID) | ORAL | 0 refills | Status: DC | PRN

## 2016-10-08 MED ORDER — HYDROCHLOROTHIAZIDE 25 MG PO TABS
25.0000 mg | ORAL_TABLET | Freq: Every day | ORAL | 1 refills | Status: DC
Start: 2016-10-08 — End: 2017-05-01

## 2016-10-08 NOTE — Patient Instructions (Addendum)
Dolor abdominal en los adultos (Abdominal Pain, Adult) El dolor abdominal puede tener muchas causas. A menudo, no es grave y [place] sin tratamiento o con tratamiento en la casa. Sin embargo, a Product/process development scientist abdominal es grave. El mdico revisar sus antecedentes mdicos y le har un examen fsico para tratar de Office manager causa del dolor abdominal. INSTRUCCIONES PARA EL CUIDADO EN EL HOGAR  Tome los medicamentos de venta libre y los recetados solamente como se lo haya indicado el mdico. No tome un laxante a menos que se lo haya indicado el mdico.  Beba suficiente lquido para Theatre manager la orina clara o de color amarillo plido.  Controle su afeccin para ver si hay cambios.  Concurra a todas las visitas de control como se lo haya indicado el mdico. Esto es importante. SOLICITE ATENCIN MDICA SI:  El dolor abdominal cambia o empeora.  No tiene apetito o baja de peso sin proponrselo.  Est estreido o tiene diarrea durante ms de 2 o 3das.  Tiene dolor cuando orina o defeca.  El dolor abdominal lo despierta de noche.  El dolor empeora con las comidas, despus de comer o con determinados alimentos.  Tiene vmitos y no puede retener nada.  Tiene fiebre. SOLICITE ATENCIN MDICA DE INMEDIATO SI:  El dolor no desaparece tan pronto como el mdico le dijo que era esperable.  No puede detener los vmitos.  El Social research officer, government se siente solo en zonas del abdomen, como el lado derecho o la parte inferior izquierda del abdomen.  Las heces son sanguinolentas o de color negro, o de aspecto alquitranado.  Tiene dolor intenso, clicos, o meteorismo en el abdomen.  Tiene signos de deshidratacin, por ejemplo:  Elmon Else, muy escasa o falta de [place].  Labios agrietados.  [employer].  Ojos hundidos.  Somnolencia.  Debilidad. Esta informacin no tiene Marine scientist el consejo del mdico. Asegrese de hacerle al mdico cualquier pregunta que tenga. Document Released:  11/04/2005 Document Revised: 11/25/2014 Document Reviewed: 04/17/2016 Elsevier Interactive Patient Education  2017 [employer].     IF you received an x-ray today, you will receive an invoice from [facility]. Please contact [facility] at [phone] with questions or concerns regarding your invoice.   IF you received labwork today, you will receive an invoice from [employer]. Please contact [facility] at [phone] with questions or concerns regarding your invoice.   Our billing staff will not be able to assist you with questions regarding bills from these companies.  You will be contacted with the lab results as soon as they are available. The fastest way to get your results is to activate your My Chart account. Instructions are located on the last page of this paperwork. If you have not heard from [place] regarding the results in 2 weeks, please contact this office.    We recommend that you schedule a mammogram for breast cancer screening. Typically, you do not need a referral to do this. Please contact a local imaging center to schedule your mammogram.  [facility] - [phone]  *ask for the Radiology Department [facility] - [phone] or [phone]  [facility] - [phone] [place] [phone] [facility] - [phone]  *ask for the [facility] - [phone]  *ask for the Radiology Department [facility] - [phone]  *ask for the [employer] - [phone]

## 2016-10-08 NOTE — Progress Notes (Signed)
    MRN: [id number] DOB: 07/06/1961  Subjective:   Dawn Espinoza is a 55 y.o. female presenting for follow up on HTN.   Managed with HCTZ 25mg  once daily. Patient reports that since her last visit on 09/25/2016, she has been taking 2 pills daily (50mg ) for an unclear reason. Denies dizziness, chronic headache, blurred vision, chest pain, shortness of breath, heart racing, palpitations, nausea, vomiting, hematuria, lower leg swelling. Denies smoking cigarettes. Patient has ongoing RUQ pain, has history of hepatic cyst. Patient would like a referral to GI for further evaluation.   Dawn Espinoza has a current medication list which includes the following prescription(s): hydrochlorothiazide, naproxen sodium, terbinafine, and fenofibrate. Also has No Known Allergies.  Dawn Espinoza  has a past medical history of Hypertension. Also  has no past surgical history on file.   Objective:   Vitals: BP (!) 142/82 (BP Location: Right Arm, Patient Position: Sitting, Cuff Size: Normal)   Pulse 84   Temp 98.4 F (36.9 C) (Oral)   Resp 17   Ht 5\' 1"  (1.549 m)   Wt 165 lb (74.8 kg)   SpO2 97%   BMI 31.18 kg/m   BP Readings from Last 3 Encounters:  10/08/16 (!) 142/82  09/25/16 110/66  02/27/16 136/82    Physical Exam  Constitutional: She is oriented to person, place, and time. She appears well-developed and well-nourished.  HENT:  Mouth/Throat: Oropharynx is clear and moist.  Eyes: No scleral icterus.  Neck: Normal range of motion. Neck supple. No thyromegaly present.  Cardiovascular: Normal rate, regular rhythm and intact distal pulses.  Exam reveals no gallop and no friction rub.   No murmur heard. Pulmonary/Chest: No respiratory distress. She has no wheezes. She has no rales.  Abdominal: Soft. Bowel sounds are normal. She exhibits no distension and no mass. There is tenderness (RUQ). There is no guarding.  Neurological: She is alert and oriented to person, place, and time.  Skin: Skin is warm and dry.     Assessment and Plan :   1. Essential hypertension 2. Elevated blood pressure reading - Counseled patient on medical compliance, CMet pending. Start HCTZ 25mg  (emphasized to take this once daily only), lisinopril 10mg . F/u in 3 months.  3. Hyperlipidemia, unspecified hyperlipidemia type - Patient never started fenofibrate, lipid panel pending. Refilled medication.  4. Hepatic cyst 5. RUQ pain - Ambulatory referral to Gastroenterology   Jaynee Eagles, PA-C [facility] [phone] 10/08/2016 3:37 PM

## 2016-11-04 ENCOUNTER — Telehealth: Payer: Self-pay

## 2016-11-04 NOTE — Telephone Encounter (Signed)
Patient is calling to talk to [facility].  She did not say what the call was about she said she wanted to talk to him directly.  Please advise   [phone]

## 2016-11-07 NOTE — Telephone Encounter (Signed)
Patient told me she received a call from the GI doctor she was referred to for a consult. She wasn't able to answer and is having a hard time getting back in touch with them now. Please let her know which GI office she was referred to and a number she can reach them. She states that she can be available by phone after 3pm. Thank you!

## 2016-11-07 NOTE — Telephone Encounter (Signed)
Spanish speaking,would you like me to triage through interpreter?

## 2016-11-08 ENCOUNTER — Encounter: Payer: Self-pay | Admitting: Physician Assistant

## 2016-11-21 ENCOUNTER — Ambulatory Visit: Payer: BLUE CROSS/BLUE SHIELD | Admitting: Gastroenterology

## 2016-11-22 ENCOUNTER — Ambulatory Visit (INDEPENDENT_AMBULATORY_CARE_PROVIDER_SITE_OTHER): Payer: BLUE CROSS/BLUE SHIELD | Admitting: Physician Assistant

## 2016-11-22 ENCOUNTER — Encounter: Payer: Self-pay | Admitting: Physician Assistant

## 2016-11-22 ENCOUNTER — Telehealth: Payer: Self-pay | Admitting: *Deleted

## 2016-11-22 VITALS — BP 130/74 | HR 80 | Ht 61.0 in | Wt 160.4 lb

## 2016-11-22 DIAGNOSIS — R109 Unspecified abdominal pain: Secondary | ICD-10-CM | POA: Diagnosis not present

## 2016-11-22 DIAGNOSIS — Z1211 Encounter for screening for malignant neoplasm of colon: Secondary | ICD-10-CM

## 2016-11-22 DIAGNOSIS — K7689 Other specified diseases of liver: Secondary | ICD-10-CM | POA: Diagnosis not present

## 2016-11-22 MED ORDER — NA SULFATE-K SULFATE-MG SULF 17.5-3.13-1.6 GM/177ML PO SOLN: 1.0000 | Freq: Once | ORAL | 0 refills | Status: AC

## 2016-11-22 NOTE — Patient Instructions (Addendum)
We will call you with the referral appointment to [facility].  You have been scheduled for a colonoscopy. Please follow written instructions given to you at your visit today.  Please pick up your prep supplies at the pharmacy within the next 1-3 days. If you use inhalers (even only as needed), please bring them with you on the day of your procedure.  We made you an appointment with [facility] for 12-16-2016 at 2:15 PM.  Arrive at 2:00 PM.

## 2016-11-22 NOTE — Progress Notes (Addendum)
Subjective:    Patient ID: Dawn Espinoza, female    DOB: 05/15/1961, 56 y.o.   MRN: [id number]  HPI Dawn Espinoza is a 56 year old Hispanic female, non-English speaking who comes in with her daughter today. She is referred by Jaynee Eagles PA-C for evaluation of right upper abdominal pain. Interview was conducted with a mobile  interpreter. Patient states that she's had her current pain for many years but has had some worsening over the past couple of months. She says that her pain is worse after she has been standing at work for a long time and also develops pain down in the back of her right leg. She states the pain is a burning type of pain and sometimes feels as if it is guarding in her right back and coming around into her right abdomen. She says the pain is eased with lying down flat. Her appetite has been fine her weight has been stable she has no complaints of nausea or vomiting. No heartburn or indigestion. She has not noticed any change in her pain postprandially. She has found Advil helpful but has not been taking it regularly. She did have recent labs done by her PCP which were unremarkable with the exception of an ALT of 40, triglycerides 337 and cholesterol 242. CT of the abdomen and pelvis was done on 09/25/2016 showing hepatic steatosis and a 1.7 x 1.6 cm benign-appearing simple cyst in the left hepatic lobe, gallbladder within normal limits and otherwise negative exam.   Patient has not had any prior GI evaluation, no previous abdominal surgeries. She has no previous history of back problems or disc disease that she is aware of.  Review of Systems Pertinent positive and negative review of systems were noted in the above HPI section.  All other review of systems was otherwise negative.  Outpatient Encounter Prescriptions as of 11/22/2016  Medication Sig  . Dextromethorphan-Guaifenesin (MUCINEX DM) 30-600 MG TB12 Take 1 tablet by mouth every 12 (twelve) hours.  . fenofibrate 54 MG tablet Take 1  tablet (54 mg total) by mouth daily.  . hydrochlorothiazide (HYDRODIURIL) 25 MG tablet Take 1 tablet (25 mg total) by mouth daily.  . Ibuprofen (ADVIL) 200 MG CAPS Take 1 capsule by mouth as needed.  Marland Kitchen lisinopril (PRINIVIL,ZESTRIL) 10 MG tablet Take 1 tablet (10 mg total) by mouth daily.  . Na Sulfate-K Sulfate-Mg Sulf 17.5-3.13-1.6 GM/180ML SOLN Take 1 kit by mouth once.  . [DISCONTINUED] acetaminophen-codeine (TYLENOL #3) 300-30 MG tablet Take 1 tablet by mouth every 6 (six) hours as needed for moderate pain.  . [DISCONTINUED] naproxen sodium (ANAPROX DS) 550 MG tablet Take 1 tablet (550 mg total) by mouth 2 (two) times daily with a meal.  . [DISCONTINUED] terbinafine (LAMISIL) 250 MG tablet Take 1 tablet (250 mg total) by mouth daily.   No facility-administered encounter medications on file as of 11/22/2016.    No Known Allergies There are no active problems to display for this patient.  Social History   Social History  . Marital status: Single    Spouse name: N/A  . Number of children: 6  . Years of education: N/A   Occupational History  . Not on file.   Social History Main Topics  . Smoking status: Never Smoker  . Smokeless tobacco: Never Used  . Alcohol use No  . Drug use: No  . Sexual activity: No   Other Topics Concern  . Not on file   Social History Narrative   ** Merged History  Encounter **        Ms. Kenneth's family history includes Alcohol abuse in her father; Heart disease in her father; Hypertension in her mother.      Objective:    Vitals:   11/22/16 0914  BP: 130/74  Pulse: 80    Physical Exam  well-developed Hispanic female in no acute distress, accompanied by her daughter. Blood pressure 130/74 pulse 80, height 5 foot 1, weight 160, BMI of 30.3. HEENT; nontraumatic normocephalic EOMI PERRLA sclera anicteric, Cardiovascular; regular rate and rhythm with S1-S2 no murmur or gallop, Pulmonary; clear bilaterally, Abdomen ;soft, bowel sounds are present no  palpable mass or hepatosplenomegaly tenderness in the right mid quadrant no guarding or rebound, Rectal; not done, Extremities ;no clubbing cyanosis or edema skin warm and dry, Neuropsych; mood and affect appropriate, Back; no tenderness to palpation over the right lower posterior with's, there is no rash or lesion noted       Assessment & Plan:   #59 56 year old non-English-speaking Hispanic female with several year history of right-sided abdominal pain which by history sounds radicular in nature. I suspect she may have a lower thoracic disc with impingement. #2 very small simple appearing hepatic cyst in the left hepatic lobe-incidental and not accounting for her right abdominal discomfort #3 Hepatic steatosis #4 hypertension #5 colon cancer screening-patient has not had previous colonoscopy, average risk  Plan; Patient will be referred to orthopedics For further imaging Advised she may continue Advil on a when necessary basis with food Will schedule for colonoscopy with Dr. Hilarie Fredrickson. Procedure was discussed in detail with the patient and her daughter , including risks and benefits and they're agreeable to proceed.    Piper Albro S Tylynn Braniff PA-C 11/22/2016   Cc: Jaynee Eagles, PA-C   Addendum: Reviewed and agree with initial management. Jerene Bears, MD

## 2016-11-22 NOTE — Telephone Encounter (Signed)
[facility] at [phone].Made patient appointment with Dr. Ninfa Linden for 12-16-2016 at  2:15 PM. I advised the patient will need an interpreter. The patient is spanish speaking.

## 2016-12-09 ENCOUNTER — Telehealth: Payer: Self-pay | Admitting: Physician Assistant

## 2016-12-10 ENCOUNTER — Telehealth: Payer: Self-pay | Admitting: *Deleted

## 2016-12-10 NOTE — Telephone Encounter (Signed)
Called [employer] to advise we do not do prior authorizations on colonoscopy preps.  Advised we will call the patient regarding the prep.

## 2016-12-10 NOTE — Telephone Encounter (Signed)
Called the patient to advise we are waiting for samples of the Suprep ( colonoscopy prep) to come in.  The patients colonoscopy is scheduled for 12-25-2016.  We advised we will call her if and when we get a sample of the prep. ( [employer] had called to advise we need to do a PA on the prep. I advised them we do not do prior- authorizations  on the preps.)  Dawn Espinoza called the patient for me. The patient is spanish speaking.

## 2016-12-13 ENCOUNTER — Encounter: Payer: Self-pay | Admitting: Internal Medicine

## 2016-12-16 ENCOUNTER — Ambulatory Visit (INDEPENDENT_AMBULATORY_CARE_PROVIDER_SITE_OTHER): Payer: BLUE CROSS/BLUE SHIELD | Admitting: Orthopaedic Surgery

## 2016-12-25 ENCOUNTER — Encounter: Payer: Self-pay | Admitting: Internal Medicine

## 2016-12-25 ENCOUNTER — Ambulatory Visit (AMBULATORY_SURGERY_CENTER): Payer: BLUE CROSS/BLUE SHIELD | Admitting: Internal Medicine

## 2016-12-25 VITALS — BP 135/62 | HR 51 | Temp 96.6°F | Resp 10 | Ht 61.0 in | Wt 160.0 lb

## 2016-12-25 DIAGNOSIS — D214 Benign neoplasm of connective and other soft tissue of abdomen: Secondary | ICD-10-CM | POA: Diagnosis not present

## 2016-12-25 DIAGNOSIS — Z1211 Encounter for screening for malignant neoplasm of colon: Secondary | ICD-10-CM | POA: Diagnosis present

## 2016-12-25 DIAGNOSIS — K635 Polyp of colon: Secondary | ICD-10-CM

## 2016-12-25 DIAGNOSIS — Z1212 Encounter for screening for malignant neoplasm of rectum: Secondary | ICD-10-CM | POA: Diagnosis not present

## 2016-12-25 DIAGNOSIS — D125 Benign neoplasm of sigmoid colon: Secondary | ICD-10-CM

## 2016-12-25 MED ORDER — SODIUM CHLORIDE 0.9 % IV SOLN: 500.0000 mL | INTRAVENOUS | Status: DC

## 2016-12-25 NOTE — Progress Notes (Signed)
Interpreter used today at the [facility] for this pt.  Interpreter's name is-Pt's states no medical or surgical changes since previsit or office visit.

## 2016-12-25 NOTE — Patient Instructions (Signed)
YOU HAD AN ENDOSCOPIC PROCEDURE TODAY AT [facility]:   Refer to the procedure report that was given to you for any specific questions about what was found during the examination.  If the procedure report does not answer your questions, please call your gastroenterologist to clarify.  If you requested that your care partner not be given the details of your procedure findings, then the procedure report has been included in a sealed envelope for you to review at your convenience later.  YOU SHOULD EXPECT: Some feelings of bloating in the abdomen. Passage of more gas than usual.  Walking can help get rid of the air that was put into your GI tract during the procedure and reduce the bloating. If you had a lower endoscopy (such as a colonoscopy or flexible sigmoidoscopy) you may notice spotting of blood in your stool or on the toilet paper. If you underwent a bowel prep for your procedure, you may not have a normal bowel movement for a few days.  Please Note:  You might notice some irritation and congestion in your nose or some drainage.  This is from the oxygen used during your procedure.  There is no need for concern and it should clear up in a day or so.  SYMPTOMS TO REPORT IMMEDIATELY:   Following lower endoscopy (colonoscopy or flexible sigmoidoscopy):  Excessive amounts of blood in the stool  Significant tenderness or worsening of abdominal pains  Swelling of the abdomen that is new, acute  Fever of 100F or higher   Following upper endoscopy (EGD)  Vomiting of blood or coffee ground material  New chest pain or pain under the shoulder blades  Painful or persistently difficult swallowing  New shortness of breath  Fever of 100F or higher  Black, tarry-looking stools  For urgent or emergent issues, a gastroenterologist can be reached at any hour by calling [phone].   DIET:  We do recommend a small meal at first, but then you may proceed to your regular diet.  Drink  plenty of fluids but you should avoid alcoholic beverages for 24 hours.  ACTIVITY:  You should plan to take it easy for the rest of today and you should NOT DRIVE or use heavy machinery until tomorrow (because of the sedation medicines used during the test).    FOLLOW UP: Our staff will call the number listed on your records the next business day following your procedure to check on you and address any questions or concerns that you may have regarding the information given to you following your procedure. If we do not reach you, we will leave a message.  However, if you are feeling well and you are not experiencing any problems, there is no need to return our call.  We will assume that you have returned to your regular daily activities without incident.  If any biopsies were taken you will be contacted by phone or by letter within the next 1-3 weeks.  Please call us at [phone] if you have not heard about the biopsies in 3 weeks.    SIGNATURES/CONFIDENTIALITY: You and/or your care partner have signed paperwork which will be entered into your electronic medical record.  These signatures attest to the fact that that the information above on your After Visit Summary has been reviewed and is understood.  Full responsibility of the confidentiality of this discharge information lies with you and/or your care-partner.   Handouts were given to your care partner on polyps and  hemorrhoids. No aspirin, aspirin products,  ibuprofen, naproxen, advil, motrin, aleve, or other non-steroidal anti-inflammatory drugs for 14 days after polyp. removal. You may resume your other current medications today. Await biopsy results. Please call if any questions or concerns.

## 2016-12-25 NOTE — Op Note (Signed)
[facility] Patient Name: Dawn Espinoza Procedure Date: 12/25/2016 2:43 PM MRN: [id number] Endoscopist: Jerene Bears , MD Age: 56 Referring MD:  Date of Birth: 02-Jun-1961 Gender: Female Account #: [id number] Procedure:                Colonoscopy Indications:              Screening for colorectal malignant neoplasm, This                            is the patient's first colonoscopy Medicines:                Monitored Anesthesia Care Procedure:                Pre-Anesthesia Assessment:                           - Prior to the procedure, a History and Physical                            was performed, and patient medications and                            allergies were reviewed. The patient's tolerance of                            previous anesthesia was also reviewed. The risks                            and benefits of the procedure and the sedation                            options and risks were discussed with the patient.                            All questions were answered, and informed consent                            was obtained. Prior Anticoagulants: The patient has                            taken no previous anticoagulant or antiplatelet                            agents. ASA Grade Assessment: II - A patient with                            mild systemic disease. After reviewing the risks                            and benefits, the patient was deemed in                            satisfactory condition to undergo the procedure.  After obtaining informed consent, the colonoscope                            was passed under direct vision. Throughout the                            procedure, the patient's blood pressure, pulse, and                            oxygen saturations were monitored continuously. The                            Model PCF-H190L [id number]) scope was introduced                            through the anus and advanced  to the the terminal                            ileum. The colonoscopy was performed without                            difficulty. The patient tolerated the procedure                            well. The quality of the bowel preparation was                            good. The terminal ileum, ileocecal valve,                            appendiceal orifice, and rectum were photographed. Scope In: 2:46:07 PM Scope Out: 3:03:28 PM Scope Withdrawal Time: 0 hours 14 minutes 48 seconds  Total Procedure Duration: 0 hours 17 minutes 21 seconds  Findings:                 The digital rectal exam was normal.                           The terminal ileum appeared normal.                           A 9 mm polyp was found in the distal sigmoid colon.                            The polyp was semi-pedunculated. The polyp was                            removed with a hot snare. Resection and retrieval                            were complete.                           Internal hemorrhoids were found during  retroflexion. The hemorrhoids were small.                           The exam was otherwise without abnormality. Complications:            No immediate complications. Estimated Blood Loss:     Estimated blood loss was minimal. Estimated blood                            loss: none. Impression:               - The examined portion of the ileum was normal.                           - One 9 mm polyp in the distal sigmoid colon,                            removed with a hot snare. Resected and retrieved.                           - Internal hemorrhoids.                           - The examination was otherwise normal. Recommendation:           - Patient has a contact number available for                            emergencies. The signs and symptoms of potential                            delayed complications were discussed with the                            patient. Return to  normal activities tomorrow.                            Written discharge instructions were provided to the                            patient.                           - Resume previous diet.                           - Continue present medications.                           - No ibuprofen, naproxen, or other non-steroidal                            anti-inflammatory drugs for 2 weeks after polyp                            removal.                           -  Await pathology results.                           - Repeat colonoscopy is recommended. The                            colonoscopy date will be determined after pathology                            results from today's exam become available for                            review. Jerene Bears, MD 12/25/2016 3:05:59 PM This report has been signed electronically.

## 2016-12-25 NOTE — Progress Notes (Signed)
Called to room to assist during endoscopic procedure.  Patient ID and intended procedure confirmed with present staff. Received instructions for my participation in the procedure from the performing physician.  

## 2016-12-25 NOTE — Progress Notes (Signed)
Call pt's daughter, Dawn Espinoza in the am at [phone] for the follow up call.  She speaks some english.  No problems noted in the recovery room. maw

## 2016-12-25 NOTE — Progress Notes (Signed)
Spanish documents on polyps and hemorrhoids given to pt's care partner. maw

## 2016-12-25 NOTE — Progress Notes (Signed)
Interpreter used today at the [facility] for this pt.  Interpreter's name is-Raquel Mora.  Maw  In the recovery room.  maw

## 2016-12-26 ENCOUNTER — Telehealth: Payer: Self-pay

## 2016-12-26 NOTE — Telephone Encounter (Signed)
  Follow up Call-  Call back number 12/25/2016  Post procedure Call Back phone  # [phone]  Permission to leave phone message Yes  Some recent data might be hidden     Patient questions:  Do you have a fever, pain , or abdominal swelling? No. Pain Score  0 *  Have you tolerated food without any problems? Yes.    Have you been able to return to your normal activities? Yes.    Do you have any questions about your discharge instructions: Diet   No. Medications  No. Follow up visit  No.  Do you have questions or concerns about your Care? No.  Actions: * If pain score is 4 or above: No action needed, pain <4.

## 2017-01-02 ENCOUNTER — Encounter: Payer: Self-pay | Admitting: Internal Medicine

## 2017-05-01 ENCOUNTER — Other Ambulatory Visit: Payer: Self-pay | Admitting: Urgent Care

## 2017-05-01 DIAGNOSIS — I10 Essential (primary) hypertension: Secondary | ICD-10-CM

## 2017-05-01 DIAGNOSIS — R030 Elevated blood-pressure reading, without diagnosis of hypertension: Secondary | ICD-10-CM

## 2017-05-01 DIAGNOSIS — E785 Hyperlipidemia, unspecified: Secondary | ICD-10-CM

## 2017-05-05 NOTE — Telephone Encounter (Signed)
COULDN'T UNDERSTAND ANYTHING I WAS SAYING CAITLIN WILL SEND A LETTER OUT TO MAKE AN APPOINTMENT

## 2017-06-12 ENCOUNTER — Other Ambulatory Visit: Payer: Self-pay | Admitting: Urgent Care

## 2017-06-12 DIAGNOSIS — E785 Hyperlipidemia, unspecified: Secondary | ICD-10-CM

## 2017-08-14 ENCOUNTER — Encounter: Payer: Self-pay | Admitting: Urgent Care

## 2017-08-14 ENCOUNTER — Ambulatory Visit (INDEPENDENT_AMBULATORY_CARE_PROVIDER_SITE_OTHER): Payer: BLUE CROSS/BLUE SHIELD | Admitting: Urgent Care

## 2017-08-14 VITALS — BP 146/90 | HR 77 | Temp 98.5°F | Resp 18 | Ht 61.42 in | Wt 161.0 lb

## 2017-08-14 DIAGNOSIS — M1712 Unilateral primary osteoarthritis, left knee: Secondary | ICD-10-CM | POA: Diagnosis not present

## 2017-08-14 DIAGNOSIS — M25561 Pain in right knee: Secondary | ICD-10-CM | POA: Diagnosis not present

## 2017-08-14 DIAGNOSIS — G8929 Other chronic pain: Secondary | ICD-10-CM

## 2017-08-14 DIAGNOSIS — R030 Elevated blood-pressure reading, without diagnosis of hypertension: Secondary | ICD-10-CM

## 2017-08-14 DIAGNOSIS — M25562 Pain in left knee: Secondary | ICD-10-CM | POA: Diagnosis not present

## 2017-08-14 DIAGNOSIS — I10 Essential (primary) hypertension: Secondary | ICD-10-CM | POA: Diagnosis not present

## 2017-08-14 MED ORDER — MELOXICAM 7.5 MG PO TABS: 7.5000 mg | ORAL_TABLET | Freq: Every day | ORAL | 1 refills | Status: DC

## 2017-08-14 MED ORDER — ATORVASTATIN CALCIUM 20 MG PO TABS
20.0000 mg | ORAL_TABLET | Freq: Every day | ORAL | 3 refills | Status: DC
Start: 2017-08-14 — End: 2018-03-26

## 2017-08-14 MED ORDER — AMLODIPINE BESYLATE 5 MG PO TABS: 5.0000 mg | ORAL_TABLET | Freq: Every day | ORAL | 1 refills | Status: DC

## 2017-08-14 NOTE — Patient Instructions (Signed)
Hipertensin Hypertension La hipertensin, conocida comnmente como presin arterial alta, se produce cuando la sangre bombea en las arterias con mucha fuerza. Las arterias son los vasos sanguneos que transportan la sangre desde el corazn al resto del cuerpo. La hipertensin hace que el corazn haga ms esfuerzo para Chiropodist y [employer] que las arterias se Teacher, music o Advertising account executive. La hipertensin no tratada o no controlada puede causar infarto de miocardio, accidentes cerebrovasculares, enfermedad renal y otros problemas. Una lectura de la presin arterial consiste de un nmero ms alto sobre un nmero ms bajo. En condiciones ideales, la presin arterial debe estar por debajo de 120/80. El primer nmero ("superior") es la presin sistlica. Es la medida de la presin de las arterias cuando el corazn late. El segundo nmero ("inferior") es la presin diastlica. Es la medida de la presin en las arterias cuando el corazn se relaja. Cules son las causas? Se desconoce la causa de esta afeccin. Qu incrementa el riesgo? Algunos factores de riesgo de hipertensin estn bajo su control. Otros no. Factores que puede [employer].  Tener diabetes mellitus tipo 2, colesterol alto, o ambos.  No hacer la cantidad suficiente de actividad fsica o ejercicio.  Tener sobrepeso.  Consumir mucha grasa, azcar, caloras o sal (sodio) en su dieta.  Beber alcohol en exceso. Factores que son difciles o imposibles de modificar  Tener enfermedad renal crnica.  Tener antecedentes familiares de presin arterial alta.  La edad. Los riesgos aumentan con la edad.  La raza. El riesgo es mayor para las Retail banker.  El sexo. Antes de los 45aos, los hombres corren ms [employer]. Despus de los 65aos, las mujeres corren ms [employer].  Tener apnea obstructiva del sueo.  El estrs. Cules son los signos o los sntomas? La presin arterial  extremadamente alta (crisis hipertensiva) puede provocar:  Dolor de [place].  Ansiedad.  Falta de aire.  Hemorragia nasal.  Nuseas y vmitos.  Dolor de pecho intenso.  Una crisis de movimientos que no puede controlar (convulsiones).  Cmo se diagnostica? Esta afeccin se diagnostica midiendo su presin arterial mientras se encuentra sentado, con el brazo apoyado sobre una superficie. El brazalete del tensimetro debe colocarse directamente sobre la piel de la parte superior del brazo y al nivel de su corazn. Debe medirla al [facility] veces en el mismo brazo. Determinadas condiciones pueden causar una diferencia de presin arterial entre el brazo izquierdo y Insurance underwriter. Ciertos factores pueden provocar que las lecturas de la presin arterial sean inferiores o superiores a lo normal (elevadas) por un perodo corto de tiempo:  Si su presin arterial es ms alta cuando se encuentra en el consultorio del mdico que cuando la mide en su hogar, se denomina "hipertensin de bata blanca". La [employer] de las personas que tienen esta afeccin no deben ser [employer].  Si su presin arterial es ms alta en el hogar que cuando se encuentra en el consultorio del mdico, se denomina "hipertensin enmascarada". La [employer] de las personas que tienen esta afeccin deben ser medicadas para Chief Technology Officer la presin arterial.  Si tiene una lecturas de presin arterial alta durante una visita o si tiene presin arterial normal con otros factores de riesgo:  Es posible que se le pida que regrese Administrator, arts para volver a Chief Technology Officer su presin arterial.  Se le puede pedir que se controle la presin arterial en su casa durante 1 semana o ms.  Si se le diagnostica hipertensin, es posible que  se le realicen otros anlisis de sangre o estudios de diagnstico por imgenes para ayudar a su mdico a comprender su riesgo general de tener otras afecciones. Cmo se trata? Esta afeccin se trata haciendo cambios saludables  en el estilo de vida, tales como ingerir alimentos saludables, realizar ms ejercicio y reducir el consumo de alcohol. El mdico puede recetarle medicamentos si los cambios en el estilo de vida no son suficientes para Child psychotherapist la presin arterial y si:  Su presin arterial sistlica est por encima de 130.  Su presin arterial diastlica est por encima de 80.  La presin arterial deseada puede variar en funcin de las enfermedades, la edad y otros factores personales. Siga estas instrucciones en su casa: Comida y bebida  Siga una dieta con alto contenido de fibras y [place], y con bajo contenido de sodio, Location manager agregada y Physicist, medical. Un ejemplo de plan alimenticio es la dieta DASH (Dietary Approaches to Stop Hypertension, Mtodos alimenticios para detener la hipertensin). Para alimentarse de esta manera: ? Coma mucha fruta y [place]. Trate de que la mitad del plato de cada comida sea de frutas y verduras. ? Coma cereales integrales, como pasta integral, arroz integral y pan integral. Llene aproximadamente un cuarto del plato con cereales integrales. ? Coma y beba productos lcteos con bajo contenido de grasa, como leche descremada o yogur bajo en grasas. ? Evite la ingesta de cortes de carne grasa, carne procesada o curada, y carne de ave con piel. Llene aproximadamente un cuarto del plato con protenas magras, como pescado, pollo sin piel, frijoles, huevos y tofu. ? Evite ingerir alimentos prehechos o procesados. En general, estos tienen mayor cantidad de sodio, azcar agregada y Wendee Copp.  Reduzca su ingesta diaria de sodio. La mayora de las personas que tienen hipertensin deben comer menos de 1500 mg de sodio por [employer].  Limite el consumo de alcohol a no ms de 1 medida por da si es mujer y no est Music therapist y a 2 medidas por da si es hombre. Una medida equivale a 12onzas de cerveza, 5onzas de vino o 1onzas de bebidas alcohlicas de alta graduacin. Estilo de vida  Trabaje  con su mdico para mantener un peso saludable o Administrator, Civil Service. Pregntele cual es su peso recomendado.  Realice al menos 30 minutos de ejercicio que haga que se acelere su corazn (ejercicio Arboriculturist) la [employer] de la [place]. Estas actividades pueden incluir caminar, nadar o andar en bicicleta.  Incluya ejercicios para fortalecer sus msculos (ejercicios de resistencia), como pilates o levantamiento de pesas, como parte de su rutina semanal de ejercicios. Intente realizar 14mnutos de este tipo de ejercicios al mSolectron Corporationa la s[place]  No consuma ningn producto que contenga nicotina o tabaco, como cigarrillos y cPsychologist, sport and exercise Si necesita ayuda para dejar de fumar, consulte al mdico.  Contrlese la presin arterial en su casa segn las indicaciones del mdico.  Concurra a todas las visitas de control como se lo haya indicado el mdico. Esto es importante. Medicamentos  TDelphide venta libre y los recetados solamente como se lo haya indicado el mdico. Siga cuidadosamente las indicaciones. Los medicamentos para la presin arterial deben tomarse segn las indicaciones.  No omita las dosis de medicamentos para la presin arterial. Si lo hace, estar en riesgo de tener problemas y puede hacer que los medicamentos sean menos eficaces.  Pregntele a su mdico a qu efectos secundarios o reacciones a los mCareers information officer Comunquese con  un mdico si:  Piensa que tiene una reaccin a un medicamento que est tomando.  Tiene dolores de cabeza frecuentes (recurrentes).  Siente mareos.  Tiene hinchazn en los tobillos.  Tiene problemas de visin. Solicite ayuda de inmediato si:  Siente un dolor de cabeza intenso o confusin.  Siente debilidad inusual o adormecimiento.  Siente que va a desmayarse.  Siente un dolor intenso en el pecho o el abdomen.  Vomita repetidas veces.  Tiene dificultad para respirar. Resumen  La  hipertensin se produce cuando la sangre bombea en las arterias con mucha fuerza. Si esta afeccin no se controla, podra correr riesgo de tener complicaciones graves.  La presin arterial deseada puede variar en funcin de las enfermedades, la edad y otros factores personales. Para la [employer], una presin arterial normal es menor que 120/80.  La hipertensin se trata con cambios en el estilo de vida, medicamentos o una combinacin de [place]. Los McDonald's Corporation estilo de vida incluyen prdida de peso, ingerir alimentos sanos, seguir una dieta baja en sodio, hacer ms ejercicio y Environmental consultant consumo de alcohol. Esta informacin no tiene Marine scientist el consejo del mdico. Asegrese de hacerle al mdico cualquier pregunta que tenga. Document Released: 11/04/2005 Document Revised: 10/16/2016 Document Reviewed: 10/16/2016 Elsevier Interactive Patient Education  2018 [place] (Arthritis) El trmino artritis se [place] comnmente para hacer referencia al dolor de las articulaciones o a la enfermedad articular. Hay ms de 100tipos de artritis. CAUSAS La causa ms frecuente de esta afeccin es el desgaste de una articulacin. Algunas otras causas son las siguientes:  Gota.  Inflamacin de una articulacin.  Una infeccin de Insurance claims handler.  Esguinces y otras lesiones cerca de la articulacin.  Una reaccin farmacolgica o alrgica. En algunos casos, es posible que la causa no se conozca. SNTOMAS El sntoma principal de esta afeccin es el dolor de la articulacin con el movimiento. Otros sntomas pueden ser los siguientes:  Enrojecimiento, hinchazn o rigidez de Insurance claims handler.  Calor que emana de Water engineer.  Cristy Hilts.  Sensacin generalizada de estar enfermo. DIAGNSTICO Esta afeccin se puede diagnosticar mediante un examen fsico y Arvid Right ellos:  Anlisis de [place].  Anlisis de [place].  Estudios de diagnstico por imgenes,  como una resonancia magntica (RM), radiografas o una tomografa computarizada (TC). A veces, se extrae lquido de una articulacin para analizarlo. TRATAMIENTO El tratamiento de esta afeccin puede incluir lo siguiente:  El tratamiento de la causa, si se conoce.  Reposo.  Mantener elevada la articulacin.  Aplicar compresas fras o calientes en la articulacin.  Medicamentos para [employer] sntomas y Risk manager.  Inyecciones de un corticoide, como cortisona, en la articulacin para ayudar a Best boy y Risk manager. Segn la causa de la artritis, tal vez haya que hacer cambios en el estilo de vida para reducir la carga sobre la articulacin. Algunos de los cambios incluyen realizar ms actividad fsica y Sports coach de [place], [place]. [place] los medicamentos de venta libre y los recetados solamente como se lo haya indicado el mdico.  No tome aspirina para Theatre stage manager dolor si se sospecha la presencia de [place]. Actividades  Ponga en reposo la articulacin como se lo haya indicado el mdico. El reposo es importante cuando la enfermedad est activa y la articulacin le duele, est hinchada o rgida.  Evite las actividades que intensifiquen Conservation officer, historic buildings. Es importante  encontrar el equilibrio entre la actividad y el reposo.  Con frecuencia, realice ejercicios de flexibilidad articular como se lo haya indicado el mdico. Intente realizar ejercicios de bajo impacto, por ejemplo: ? Natacin. ? Gimnasia acutica. ? Andar en bicicleta. ? Caminar. Cuidado de la articulacin  Si la articulacin se le hincha, mantngala elevada como se lo haya indicado el mdico.  Si al despertar por la maana, nota que la articulacin est rgida, intente tomar una ducha con agua tibia.  Si se lo indican, pngase calor en la articulacin. Si es diabtico, no se aplique calor sin la autorizacin del mdico. ? Coloque una toalla  entre la articulacin y la compresa caliente o la almohadilla trmica. ? Coloque el calor en la zona durante 20 o 4minutos.  Si se lo indican, pngase hielo en la articulacin: ? Ponga el hielo en una bolsa plstica. ? Coloque una [employer] piel y la bolsa de hielo. ? Coloque el hielo durante 66minutos, 2 a 3veces por da.  Concurra a todas las visitas de control como se lo haya indicado el mdico. Esto es importante. SOLICITE ATENCIN MDICA SI:  El dolor empeora.  Tiene fiebre. SOLICITE ATENCIN MDICA DE INMEDIATO SI:  Siente dolor, u observa hinchazn o enrojecimiento en la articulacin.  Siente dolor en muchas articulaciones y se le hinchan.  Siente un dolor intenso en la espalda.  Tiene mucha debilidad en la pierna.  No puede controlar los intestinos o la vejiga. Esta informacin no tiene Marine scientist el consejo del mdico. Asegrese de hacerle al mdico cualquier pregunta que tenga. Document Released: 11/04/2005 Document Revised: 02/26/2016 Document Reviewed: 01/30/2015 Elsevier Interactive Patient Education  [employer].

## 2017-08-14 NOTE — Progress Notes (Addendum)
   MRN: [id number] DOB: Feb 01, 1961  Subjective:   Dawn Espinoza is a 56 y.o. female presenting for chief complaint of Knee Pain (X 2 year- left knee)  Reports that she has had chronic left knee pain, worse in the past 2 weeks, now has right knee pain as well. She has been seen by ortho ~6 months ago, underwent further imaging and was told that she has osteoarthritis in her knee. Denies fever, redness, falls, trauma, swelling. Regarding her HTN, patient ran out of her medications and stopped taking them. Today, she reports intermittent headaches, dizziness. Denies chest pain, shortness of breath, heart racing, palpitations, nausea, vomiting, abdominal pain, hematuria, lower leg swelling. Denies smoking cigarettes or drinking alcohol.   Dawn Espinoza is not currently taking any medications and has No Known Allergies.  Dawn Espinoza  has a past medical history of HLD (hyperlipidemia) and Hypertension. Denies past surgical history.  Objective:   Vitals: BP (!) 146/90 (BP Location: Right Arm, Patient Position: Sitting, Cuff Size: Normal)   Pulse 77   Temp 98.5 F (36.9 C) (Oral)   Resp 18   Ht 5' 1.42" (1.56 m)   Wt 161 lb (73 kg)   SpO2 97%   BMI 30.01 kg/m   BP Readings from Last 3 Encounters:  08/14/17 (!) 146/90  12/25/16 135/62  11/22/16 130/74   Physical Exam  Constitutional: She is oriented to person, place, and time. She appears well-developed and well-nourished.  HENT:  Mouth/Throat: Oropharynx is clear and moist.  Eyes: Pupils are equal, round, and reactive to light. No scleral icterus.  Neck: No thyromegaly present.  Cardiovascular: Normal rate, regular rhythm and intact distal pulses.  Exam reveals no gallop and no friction rub.   No murmur heard. Pulmonary/Chest: No respiratory distress. She has no wheezes. She has no rales.  Abdominal: Soft. Bowel sounds are normal. She exhibits no distension and no mass. There is no tenderness. There is no guarding.  Musculoskeletal:       Left  knee: She exhibits normal range of motion, no swelling, no effusion, no ecchymosis, no deformity, no laceration, no erythema, normal alignment, normal patellar mobility and no bony tenderness. Tenderness found. Lateral joint line (mild) tenderness noted. No medial joint line and no patellar tendon tenderness noted.  Neurological: She is alert and oriented to person, place, and time.  Skin: Skin is warm and dry.  Psychiatric: She has a normal mood and affect.   Assessment and Plan :   1. Essential hypertension 2. Elevated blood pressure reading - Labs pending, start amlodipine, recommended dietary modifications. Recheck in 4 weeks. - Comprehensive metabolic panel  3. Arthritis of left knee 4. Chronic pain of left knee 5. Acute pain of right knee - Patient states that her orthopedist recommended surgery. She would like to try medical therapy first. I offered meloxicam, icing, recommended she use the knee brace provided by her orthopedist. Recheck in 4 weeks, consider steroid course.   Jaynee Eagles, PA-C [facility] [phone] 08/14/2017  3:01 PM

## 2017-08-15 LAB — COMPREHENSIVE METABOLIC PANEL
ALT: 22 IU/L (ref 0–32)
AST: 25 IU/L (ref 0–40)
Albumin/Globulin Ratio: 1.4 (ref 1.2–2.2)
Albumin: 4.5 g/dL (ref 3.5–5.5)
Alkaline Phosphatase: 78 IU/L (ref 39–117)
BUN/Creatinine Ratio: 21 (ref 9–23)
BUN: 16 mg/dL (ref 6–24)
Bilirubin Total: 0.3 mg/dL (ref 0.0–1.2)
CO2: 24 mmol/L (ref 20–29)
Calcium: 9.7 mg/dL (ref 8.7–10.2)
Chloride: 102 mmol/L (ref 96–106)
Creatinine, Ser: 0.78 mg/dL (ref 0.57–1.00)
GFR calc Af Amer: 98 mL/min/{1.73_m2} (ref >59)
GFR calc non Af Amer: 85 mL/min/{1.73_m2} (ref >59)
Globulin, Total: 3.2 g/dL (ref 1.5–4.5)
Glucose: 88 mg/dL (ref 65–99)
Potassium: 4.3 mmol/L (ref 3.5–5.2)
Sodium: 142 mmol/L (ref 134–144)
Total Protein: 7.7 g/dL (ref 6.0–8.5)

## 2017-08-15 LAB — MICROALBUMIN / CREATININE URINE RATIO
Creatinine, Urine: 79 mg/dL
Microalb/Creat Ratio: 14.9 mg/g creat (ref 0.0–30.0)
Microalbumin, Urine: 11.8 ug/mL

## 2017-09-11 ENCOUNTER — Encounter: Payer: Self-pay | Admitting: Urgent Care

## 2017-09-11 ENCOUNTER — Ambulatory Visit (INDEPENDENT_AMBULATORY_CARE_PROVIDER_SITE_OTHER): Payer: BLUE CROSS/BLUE SHIELD | Admitting: Urgent Care

## 2017-09-11 VITALS — BP 130/76 | HR 79 | Temp 98.9°F | Resp 16 | Ht 61.0 in | Wt 161.6 lb

## 2017-09-11 DIAGNOSIS — G8929 Other chronic pain: Secondary | ICD-10-CM

## 2017-09-11 DIAGNOSIS — Z23 Encounter for immunization: Secondary | ICD-10-CM | POA: Diagnosis not present

## 2017-09-11 DIAGNOSIS — M1712 Unilateral primary osteoarthritis, left knee: Secondary | ICD-10-CM | POA: Diagnosis not present

## 2017-09-11 DIAGNOSIS — I10 Essential (primary) hypertension: Secondary | ICD-10-CM | POA: Diagnosis not present

## 2017-09-11 DIAGNOSIS — M25561 Pain in right knee: Secondary | ICD-10-CM

## 2017-09-11 DIAGNOSIS — M25562 Pain in left knee: Secondary | ICD-10-CM | POA: Diagnosis not present

## 2017-09-11 MED ORDER — MELOXICAM 7.5 MG PO TABS: 7.5000 mg | ORAL_TABLET | Freq: Every day | ORAL | 1 refills | Status: DC

## 2017-09-11 MED ORDER — AMLODIPINE BESYLATE 5 MG PO TABS: 5.0000 mg | ORAL_TABLET | Freq: Every day | ORAL | 1 refills | Status: DC

## 2017-09-11 NOTE — Patient Instructions (Addendum)
Artritis (Arthritis) El trmino artritis se usa comnmente para hacer referencia al dolor de las articulaciones o a la enfermedad articular. Hay ms de 100tipos de artritis. CAUSAS La causa ms frecuente de esta afeccin es el desgaste de una articulacin. Algunas otras causas son las siguientes:  Gota.  Inflamacin de una articulacin.  Una infeccin de una articulacin.  Esguinces y otras lesiones cerca de la articulacin.  Una reaccin farmacolgica o alrgica. En algunos casos, es posible que la causa no se conozca. SNTOMAS El sntoma principal de esta afeccin es el dolor de la articulacin con el movimiento. Otros sntomas pueden ser los siguientes:  Enrojecimiento, hinchazn o rigidez de una articulacin.  Calor que emana de la articulacin.  Fiebre.  Sensacin generalizada de estar enfermo. DIAGNSTICO Esta afeccin se puede diagnosticar mediante un examen fsico y estudios, entre ellos:  Anlisis de sangre.  Anlisis de orina.  Estudios de diagnstico por imgenes, como una resonancia magntica (RM), radiografas o una tomografa computarizada (TC). A veces, se extrae lquido de una articulacin para analizarlo. TRATAMIENTO El tratamiento de esta afeccin puede incluir lo siguiente:  El tratamiento de la causa, si se conoce.  Reposo.  Mantener elevada la articulacin.  Aplicar compresas fras o calientes en la articulacin.  Medicamentos para aliviar los sntomas y reducir la inflamacin.  Inyecciones de un corticoide, como cortisona, en la articulacin para ayudar a aliviar el dolor y reducir la inflamacin. Segn la causa de la artritis, tal vez haya que hacer cambios en el estilo de vida para reducir la carga sobre la articulacin. Algunos de los cambios incluyen realizar ms actividad fsica y bajar de peso, entre otros. INSTRUCCIONES PARA EL CUIDADO EN EL HOGAR Medicamentos  Tome los medicamentos de venta libre y los recetados solamente como se lo  haya indicado el mdico.  No tome aspirina para aliviar el dolor si se sospecha la presencia de gota. Actividades  Ponga en reposo la articulacin como se lo haya indicado el mdico. El reposo es importante cuando la enfermedad est activa y la articulacin le duele, est hinchada o rgida.  Evite las actividades que intensifiquen el dolor. Es importante encontrar el equilibrio entre la actividad y el reposo.  Con frecuencia, realice ejercicios de flexibilidad articular como se lo haya indicado el mdico. Intente realizar ejercicios de bajo impacto, por ejemplo: ? Natacin. ? Gimnasia acutica. ? Andar en bicicleta. ? Caminar. Cuidado de la articulacin  Si la articulacin se le hincha, mantngala elevada como se lo haya indicado el mdico.  Si al despertar por la maana, nota que la articulacin est rgida, intente tomar una ducha con agua tibia.  Si se lo indican, pngase calor en la articulacin. Si es diabtico, no se aplique calor sin la autorizacin del mdico. ? Coloque una toalla entre la articulacin y la compresa caliente o la almohadilla trmica. ? Coloque el calor en la zona durante 20 o 30minutos.  Si se lo indican, pngase hielo en la articulacin: ? Ponga el hielo en una bolsa plstica. ? Coloque una toalla entre la piel y la bolsa de hielo. ? Coloque el hielo durante 20minutos, 2 a 3veces por da.  Concurra a todas las visitas de control como se lo haya indicado el mdico. Esto es importante. SOLICITE ATENCIN MDICA SI:  El dolor empeora.  Tiene fiebre. SOLICITE ATENCIN MDICA DE INMEDIATO SI:  Siente dolor, u observa hinchazn o enrojecimiento en la articulacin.  Siente dolor en muchas articulaciones y se le hinchan.  Siente un dolor   intenso en la espalda.  Tiene mucha debilidad en la pierna.  No puede controlar los intestinos o la vejiga. Esta informacin no tiene Marine scientist el consejo del mdico. Asegrese de hacerle al mdico cualquier  pregunta que tenga. Document Released: 11/04/2005 Document Revised: 02/26/2016 Document Reviewed: 01/30/2015 Elsevier Interactive Patient Education  2018 [employer].     IF you received an x-ray today, you will receive an invoice from [facility]. Please contact [facility] at [phone] with questions or concerns regarding your invoice.   IF you received labwork today, you will receive an invoice from [facility]. Please contact [facility] at [phone] with questions or concerns regarding your invoice.   Our billing staff will not be able to assist you with questions regarding bills from these companies.  You will be contacted with the lab results as soon as they are available. The fastest way to get your results is to activate your My Chart account. Instructions are located on the last page of this paperwork. If you have not heard from [place] regarding the results in 2 weeks, please contact this office.

## 2017-09-11 NOTE — Progress Notes (Signed)
    MRN: [id number] DOB: [birth year]  Subjective:   Dawn Espinoza is a 56 y.o. female presenting for follow up on HTN. Denies dizziness, chronic headache, blurred vision, chest pain, shortness of breath, heart racing, palpitations, nausea, vomiting, abdominal pain, hematuria, lower leg swelling. Has ongoing bilateral knee pain. She has been tried on multiple oral medications, knee injections, has been recommended to wear a knee brace. Her orthopedist would like for patient to do surgery but patient has not yet set this up. Admits that she compensates with her right leg/knee a lot and is worried that this is going to continue to affect her and become as bad as her left knee.  Dawn Espinoza has a current medication list which includes the following prescription(s): amlodipine, atorvastatin, ibuprofen, and meloxicam. Also has No Known Allergies.  Dawn Espinoza  has a past medical history of HLD (hyperlipidemia) and Hypertension. Also  has a past surgical history that includes No past surgeries.  Objective:   Vitals: BP 130/76   Pulse 79   Temp 98.9 F (37.2 C) (Oral)   Resp 16   Ht 5\' 1"  (1.549 m)   Wt 161 lb 9.6 oz (73.3 kg)   SpO2 98%   BMI 30.53 kg/m   BP Readings from Last 3 Encounters:  09/11/17 130/76  08/14/17 (!) 146/90  12/25/16 135/62    Physical Exam  Constitutional: She is oriented to person, place, and time. She appears well-developed and well-nourished.  Cardiovascular: Normal rate, regular rhythm and intact distal pulses.  Exam reveals no gallop and no friction rub.   No murmur heard. Pulmonary/Chest: No respiratory distress. She has no wheezes. She has no rales.  Musculoskeletal:       Left knee: She exhibits normal range of motion, no swelling, no effusion, no ecchymosis, no deformity, no erythema, normal alignment, normal patellar mobility and no bony tenderness. No tenderness found.  Neurological: She is alert and oriented to person, place, and time.    Assessment and Plan :    1. Essential hypertension - Well controlled. Maintain her amlodipine.  2. Osteoarthritis of left knee, unspecified osteoarthritis type 3. Chronic pain of left knee 4. Acute pain of right knee - Recommended patient follow orthopedist recommendation to have surgery done. In the meantime, she is okay to take meloxicam.  5. Need for prophylactic vaccination and inoculation against influenza - Flu Vaccine QUAD 36+ mos IM   Dawn Eagles, PA-C [facility] [phone] 09/11/2017 3:17 PM

## 2017-10-17 ENCOUNTER — Ambulatory Visit (INDEPENDENT_AMBULATORY_CARE_PROVIDER_SITE_OTHER): Payer: BLUE CROSS/BLUE SHIELD | Admitting: Physician Assistant

## 2017-10-17 ENCOUNTER — Encounter: Payer: Self-pay | Admitting: Physician Assistant

## 2017-10-17 ENCOUNTER — Ambulatory Visit (INDEPENDENT_AMBULATORY_CARE_PROVIDER_SITE_OTHER): Payer: BLUE CROSS/BLUE SHIELD

## 2017-10-17 VITALS — BP 130/80 | HR 125 | Temp 98.0°F | Resp 17 | Ht 61.5 in | Wt 163.0 lb

## 2017-10-17 DIAGNOSIS — J22 Unspecified acute lower respiratory infection: Secondary | ICD-10-CM

## 2017-10-17 DIAGNOSIS — R6889 Other general symptoms and signs: Secondary | ICD-10-CM

## 2017-10-17 LAB — POC INFLUENZA A&B (BINAX/QUICKVUE)
Influenza A, POC: NEGATIVE
Influenza B, POC: NEGATIVE

## 2017-10-17 MED ORDER — AZITHROMYCIN 250 MG PO TABS: ORAL_TABLET | ORAL | 0 refills | Status: DC

## 2017-10-17 MED ORDER — BENZONATATE 100 MG PO CAPS: 100.0000 mg | ORAL_CAPSULE | Freq: Three times a day (TID) | ORAL | 0 refills | Status: DC | PRN

## 2017-10-17 MED ORDER — GUAIFENESIN ER 1200 MG PO TB12: 1.0000 | ORAL_TABLET | Freq: Two times a day (BID) | ORAL | 1 refills | Status: DC | PRN

## 2017-10-17 MED ORDER — HYDROCOD POLST-CPM POLST ER 10-8 MG/5ML PO SUER: 5.0000 mL | Freq: Two times a day (BID) | ORAL | 0 refills | Status: DC | PRN

## 2017-10-17 MED ORDER — IBUPROFEN 200 MG PO TABS
600.0000 mg | ORAL_TABLET | Freq: Once | ORAL | Status: AC
  Administered 2017-10-17: 600 mg via ORAL

## 2017-10-17 NOTE — Patient Instructions (Addendum)
Please make sure you hydrate well with water.   Take the azithromycin as prescribed.  Atelectasia en adultos (Atelectasis, Adult) La atelectasia es el colapso de los pequeos sacos de aire en los pulmones (alvolos pulmonares). Cuando esto ocurre, una parte o todo el pulmn colapsa y se [place] sin aire. Esta afeccin tiene muchas causas y es un problema frecuente despus de [place]. La gravedad de la atelectasia variar segn el tamao de la zona involucrada y la causa subyacente del problema. CAUSAS  Hay mltiples causas para la atelectasia:   La respiracin superficial, especialmente si hay una lesin en la pared torcica o en el abdomen que pueda causar dolor al hacer inspiraciones profundas. Esto es frecuente despus de [place].  Obstruccin de las vas respiratorias (bronquios o bronquiolos). Esto puede deberse a una acumulacin de mucosidad (tapn de moco), a tumores, a cogulos sanguneos (embolia pulmonar) o a la aspiracin de cuerpos extraos. El tapn de moco se produce cuando los pulmones no se expanden lo suficiente para eliminar la mucosidad.  Presin externa a los pulmones. Esto puede deberse a un tumor, a la presencia de lquido (derrame pleural) o a una fuga de aire entre los pulmones y la caja torcica (neumotrax).  Infecciones como la neumona.  Cicatriz en el tejido pulmonar como resultado de una infeccin o lesin previa.  Algunas enfermedades, como la fibrosis [place]. SIGNOS Y SNTOMAS  En general, la atelectasia no presenta sntomas. Si se presentan sntomas, stos pueden ser:  [place].  [place] [employer] uas, los labios o la boca (cianosis). DIAGNSTICO  El mdico sospechar atelectasia segn los sntomas y los hallazgos fsicos. Es necesario realizar una radiografa para confirmar el diagnstico. En algunos casos ser necesario realizar exmenes radiolgicos ms especializados.  TRATAMIENTO  El tratamiento depender de la causa de la  atelectasia. El tratamiento puede incluir:  Toser de [place] deliberada para aflojar el tapn de mucosidad en los pulmones.  Fisioterapia en el trax. Consiste en el golpeteo o percusin en el pecho, sobre la zona de los pulmones, para aflojar los tapones de moco.  Tcnicas de drenaje postural. Consiste en posicionar el cuerpo de modo que la cabeza quede ms baja que el trax. INSTRUCCIONES PARA EL CUIDADO EN EL HOGAR  Practique tcnicas de respiracin profunda y relajada siempre que se encuentre sentado. Una buena tcnica es realizar algunas inspiraciones profundas de manera relajada, cada vez que hay un aviso comercial en televisin.  Si le dieron un dispositivo de respiracin profunda (como un espirmetro de incentivo) o un dispositivo de eliminacin de mucosidad, selos regularmente segn las indicaciones de su mdico.  Trate de toser varias veces al da segn las indicaciones de su mdico.  Realice todos los ejercicios de fisioterapia respiratoria o tcnicas de drenaje postural como lo indique su mdico. Si es necesario, haga que alguien (como un miembro de la familia) lo ayude con estas tcnicas.  Cuando est acostado, hgalo sobre el lado no afectado para estimular el drenaje de la mucosidad.  Permanezca fsicamente activo todo lo posible. SOLICITE ATENCIN MDICA DE INMEDIATO SI:   Tiene cada vez ms problemas para respirar.  Siente un dolor intenso en el pecho.  Comienza a tener una tos intensa o elimina sangre al toser.  Tiene fiebre o sntomas persistentes durante ms de 2a 3das.  Tiene fiebre y los sntomas empeoran repentinamente. ASEGRESE DE QUE:  Comprende estas instrucciones.  Controlar su afeccin.  Recibir ayuda de inmediato si no mejora o si empeora. Esta  informacin no tiene Marine scientist el consejo del mdico. Asegrese de hacerle al mdico cualquier pregunta que tenga. Document Released: 02/20/2009 Document Revised: 11/25/2014 Elsevier  Interactive Patient Education  2017 [employer].     IF you received an x-ray today, you will receive an invoice from [facility]. Please contact [facility] at [phone] with questions or concerns regarding your invoice.   IF you received labwork today, you will receive an invoice from [place]. Please contact [facility] at [phone] with questions or concerns regarding your invoice.   Our billing staff will not be able to assist you with questions regarding bills from these companies.  You will be contacted with the lab results as soon as they are available. The fastest way to get your results is to activate your My Chart account. Instructions are located on the last page of this paperwork. If you have not heard from [place] regarding the results in 2 weeks, please contact this office.

## 2017-10-17 NOTE — Progress Notes (Signed)
PRIMARY CARE AT [facility] [address], [place] [address] [phone]  Date:  10/17/2017   Name:  Dawn Espinoza   DOB:  05-02-1961   MRN:  [id number]  PCP:  Patient, No Pcp Per    History of Present Illness:  Dawn Espinoza is a 56 y.o. female patient who presents to PCP with  Chief Complaint  Patient presents with  . Sore Throat    x 2 days  . Fever  . Headache    x 2 days  . Cough     She has sore throat, fever, and headache. She has not taken her temperature.  Her cough is non-productive.  Denies sob or dyspnea, but feels very fatigued.  Not sleeping during the night due to cough.  Congestion is minimal    There are no active problems to display for this patient.   Past Medical History:  Diagnosis Date  . HLD (hyperlipidemia)   . Hypertension     Past Surgical History:  Procedure Laterality Date  . NO PAST SURGERIES      Social History   Tobacco Use  . Smoking status: Never Smoker  . Smokeless tobacco: Never Used  Substance Use Topics  . Alcohol use: No    Alcohol/week: 0.0 oz  . Drug use: No    Family History  Problem Relation Age of Onset  . Hypertension Mother   . Alcohol abuse Father   . Heart disease Father     No Known Allergies  Medication list has been reviewed and updated.  Current Outpatient Medications on File Prior to Visit  Medication Sig Dispense Refill  . amLODipine (NORVASC) 5 MG tablet Take 1 tablet (5 mg total) by mouth daily. 90 tablet 1  . atorvastatin (LIPITOR) 20 MG tablet Take 1 tablet (20 mg total) by mouth daily. 90 tablet 3  . meloxicam (MOBIC) 7.5 MG tablet Take 1 tablet (7.5 mg total) by mouth daily. 30 tablet 1   No current facility-administered medications on file prior to visit.     ROS ROS otherwise unremarkable unless listed above.  Physical Examination: BP 130/80   Pulse (!) 125   Temp 98 F (36.7 C) (Oral)   Resp 17   Ht 5' 1.5" (1.562 m)   Wt 163 lb (73.9 kg)   SpO2 98%   BMI 30.30 kg/m  Ideal  Body Weight: Weight in (lb) to have BMI = 25: 134.2 Pulse rechecked at 114. Temperature rechecked at 100.8 Physical Exam  Constitutional: She is oriented to person, place, and time. She appears well-developed and well-nourished. No distress.  HENT:  Head: Normocephalic and atraumatic.  Right Ear: Tympanic membrane, external ear and ear canal normal.  Left Ear: Tympanic membrane, external ear and ear canal normal.  Nose: Mucosal edema and rhinorrhea present. Right sinus exhibits no maxillary sinus tenderness and no frontal sinus tenderness. Left sinus exhibits no maxillary sinus tenderness and no frontal sinus tenderness.  Mouth/Throat: No uvula swelling. No oropharyngeal exudate, posterior oropharyngeal edema or posterior oropharyngeal erythema.  Eyes: Conjunctivae and EOM are normal. Pupils are equal, round, and reactive to light.  Cardiovascular: Normal rate and regular rhythm. Exam reveals no gallop, no distant heart sounds and no friction rub.  No murmur heard. Pulmonary/Chest: Effort normal. No respiratory distress. She has no decreased breath sounds. She has no wheezes. She has no rhonchi.  Lymphadenopathy:       Head (right side): No submandibular, no tonsillar, no preauricular and no posterior auricular adenopathy  present.       Head (left side): No submandibular, no tonsillar, no preauricular and no posterior auricular adenopathy present.  Neurological: She is alert and oriented to person, place, and time.  Skin: She is not diaphoretic.  Psychiatric: She has a normal mood and affect. Her behavior is normal.     Results for orders placed or performed in visit on 10/17/17  POC Influenza A&B(BINAX/QUICKVUE)  Result Value Ref Range   Influenza A, POC Negative Negative   Influenza B, POC Negative Negative   Dg Chest 2 View  Result Date: 10/17/2017 CLINICAL DATA:  Cough.  Pharyngitis.  Tachycardia. EXAM: CHEST  2 VIEW COMPARISON:  11/13/2015. FINDINGS: Normal sized heart. Small  amount of linear density in the right mid and left lower lung zones. Otherwise, clear lungs with normal vascularity. Mild thoracic spine degenerative changes. IMPRESSION: Interval small amount of linear atelectasis or scarring bilaterally. Otherwise, unremarkable examination. Electronically Signed   By: Claudie Revering M.D.   On: 10/17/2017 16:53     Assessment and Plan: Dawn Espinoza is a 56 y.o. female who is here today for cc of  Chief Complaint  Patient presents with  . Sore Throat    x 2 days  . Fever  . Headache    x 2 days  . Cough   Likely start of pneumonia. We will treat today with zpak.  Advised of alarming symptoms to warrant immediate return. Advised to follow up in 4 days for recheck.  Flu-like symptoms - Plan: POC Influenza A&B(BINAX/QUICKVUE), DG Chest 2 View, ibuprofen (ADVIL,MOTRIN) tablet 600 mg  Lower respiratory infection (e.g., bronchitis, pneumonia, pneumonitis, pulmonitis) - Plan: azithromycin (ZITHROMAX) 250 MG tablet, Guaifenesin (MUCINEX MAXIMUM STRENGTH) 1200 MG TB12, benzonatate (TESSALON) 100 MG capsule, chlorpheniramine-HYDROcodone (TUSSIONEX PENNKINETIC ER) 10-8 MG/5ML SUER  Ivar Drape, PA-C [facility] 12/4/201810:25 AM

## 2017-10-20 ENCOUNTER — Ambulatory Visit: Payer: BLUE CROSS/BLUE SHIELD | Admitting: Physician Assistant

## 2017-10-20 VITALS — BP 132/68 | HR 76 | Temp 98.0°F | Resp 16 | Ht 61.0 in | Wt 161.0 lb

## 2017-10-20 DIAGNOSIS — J22 Unspecified acute lower respiratory infection: Secondary | ICD-10-CM

## 2017-10-20 NOTE — Patient Instructions (Signed)
     IF you received an x-ray today, you will receive an invoice from [facility]. Please contact [facility] at [phone] with questions or concerns regarding your invoice.   IF you received labwork today, you will receive an invoice from [facility]. Please contact [facility] at [phone] with questions or concerns regarding your invoice.   Our billing staff will not be able to assist you with questions regarding bills from these companies.  You will be contacted with the lab results as soon as they are available. The fastest way to get your results is to activate your My Chart account. Instructions are located on the last page of this paperwork. If you have not heard from us regarding the results in 2 weeks, please contact this office.     

## 2017-10-24 ENCOUNTER — Encounter: Payer: Self-pay | Admitting: Physician Assistant

## 2017-10-24 NOTE — Progress Notes (Signed)
PRIMARY CARE AT [facility] [address], [place] [address] [phone]  Date:  10/20/2017   Name:  Dawn Espinoza   DOB:  [birth year]   MRN:  [id number]  PCP:  Patient, No Pcp Per    History of Present Illness:  Dawn Espinoza is a 56 y.o. female patient who presents to PCP with  Chief Complaint  Patient presents with  . Follow-up    pnuemonia/ pt states she feels better, but a lil dizzy from meds     Patient is doing well following antibiotic.  She is on day 4 of her zpak.  She continues to have a minimal cough, though this is stated to resolve.  No fevers.  She has not taken any nsaid in the last 24 hours.   There are no active problems to display for this patient.   Past Medical History:  Diagnosis Date  . HLD (hyperlipidemia)   . Hypertension     Past Surgical History:  Procedure Laterality Date  . NO PAST SURGERIES      Social History   Tobacco Use  . Smoking status: Never Smoker  . Smokeless tobacco: Never Used  Substance Use Topics  . Alcohol use: No    Alcohol/week: 0.0 oz  . Drug use: No    Family History  Problem Relation Age of Onset  . Hypertension Mother   . Alcohol abuse Father   . Heart disease Father     No Known Allergies  Medication list has been reviewed and updated.  Current Outpatient Medications on File Prior to Visit  Medication Sig Dispense Refill  . amLODipine (NORVASC) 5 MG tablet Take 1 tablet (5 mg total) by mouth daily. 90 tablet 1  . atorvastatin (LIPITOR) 20 MG tablet Take 1 tablet (20 mg total) by mouth daily. 90 tablet 3  . azithromycin (ZITHROMAX) 250 MG tablet Take 2 tabs PO x 1 dose, then 1 tab PO QD x 4 days 6 tablet 0  . benzonatate (TESSALON) 100 MG capsule Take 1-2 capsules (100-200 mg total) by mouth 3 (three) times daily as needed for cough. 40 capsule 0  . chlorpheniramine-HYDROcodone (TUSSIONEX PENNKINETIC ER) 10-8 MG/5ML SUER Take 5 mLs by mouth every 12 (twelve) hours as needed. 60 mL 0  . Guaifenesin (MUCINEX  MAXIMUM STRENGTH) 1200 MG TB12 Take 1 tablet (1,200 mg total) by mouth every 12 (twelve) hours as needed. 14 tablet 1  . meloxicam (MOBIC) 7.5 MG tablet Take 1 tablet (7.5 mg total) by mouth daily. 30 tablet 1   No current facility-administered medications on file prior to visit.     ROS ROS otherwise unremarkable unless listed above.  Physical Examination: BP 132/68   Pulse 76   Temp 98 F (36.7 C) (Oral)   Resp 16   Ht 5\' 1"  (1.549 m)   Wt 161 lb (73 kg)   SpO2 98%   BMI 30.42 kg/m  Ideal Body Weight: Weight in (lb) to have BMI = 25: 132  Physical Exam  Constitutional: She is oriented to person, place, and time. She appears well-developed and well-nourished. No distress.  HENT:  Head: Normocephalic and atraumatic.  Right Ear: External ear normal.  Left Ear: External ear normal.  Nose: No mucosal edema or rhinorrhea.  Eyes: Conjunctivae and EOM are normal. Pupils are equal, round, and reactive to light.  Cardiovascular: Normal rate, regular rhythm and normal heart sounds. Exam reveals no friction rub.  No murmur heard. Pulmonary/Chest: Effort normal. No respiratory distress. She  has no wheezes.  Neurological: She is alert and oriented to person, place, and time.  Skin: She is not diaphoretic.  Psychiatric: She has a normal mood and affect. Her behavior is normal.     Assessment and Plan: Dawn Espinoza is a 56 y.o. female who is here today for cc of  Chief Complaint  Patient presents with  . Follow-up    pnuemonia/ pt states she feels better, but a lil dizzy from meds   Lower respiratory infection (e.g., bronchitis, pneumonia, pneumonitis, pulmonitis)  Ivar Drape, PA-C [facility] 12/7/20189:01 AM

## 2018-02-18 ENCOUNTER — Encounter: Payer: Self-pay | Admitting: Physician Assistant

## 2018-03-13 ENCOUNTER — Other Ambulatory Visit: Payer: Self-pay | Admitting: Urgent Care

## 2018-03-26 ENCOUNTER — Ambulatory Visit: Payer: BLUE CROSS/BLUE SHIELD | Admitting: Urgent Care

## 2018-03-26 ENCOUNTER — Encounter: Payer: Self-pay | Admitting: Urgent Care

## 2018-03-26 VITALS — BP 145/68 | HR 78 | Temp 98.1°F | Resp 18 | Ht 61.0 in | Wt 160.4 lb

## 2018-03-26 DIAGNOSIS — M25562 Pain in left knee: Secondary | ICD-10-CM

## 2018-03-26 DIAGNOSIS — R519 Headache, unspecified: Secondary | ICD-10-CM

## 2018-03-26 DIAGNOSIS — E785 Hyperlipidemia, unspecified: Secondary | ICD-10-CM | POA: Diagnosis not present

## 2018-03-26 DIAGNOSIS — M25561 Pain in right knee: Secondary | ICD-10-CM

## 2018-03-26 DIAGNOSIS — R42 Dizziness and giddiness: Secondary | ICD-10-CM | POA: Diagnosis not present

## 2018-03-26 DIAGNOSIS — R030 Elevated blood-pressure reading, without diagnosis of hypertension: Secondary | ICD-10-CM | POA: Diagnosis not present

## 2018-03-26 DIAGNOSIS — I10 Essential (primary) hypertension: Secondary | ICD-10-CM

## 2018-03-26 DIAGNOSIS — G8929 Other chronic pain: Secondary | ICD-10-CM

## 2018-03-26 DIAGNOSIS — R7309 Other abnormal glucose: Secondary | ICD-10-CM

## 2018-03-26 DIAGNOSIS — R51 Headache: Secondary | ICD-10-CM | POA: Diagnosis not present

## 2018-03-26 MED ORDER — ATORVASTATIN CALCIUM 20 MG PO TABS: 20.0000 mg | ORAL_TABLET | Freq: Every day | ORAL | 3 refills | Status: DC

## 2018-03-26 MED ORDER — MELOXICAM 7.5 MG PO TABS: 7.5000 mg | ORAL_TABLET | Freq: Every day | ORAL | 1 refills | Status: DC

## 2018-03-26 MED ORDER — AMLODIPINE BESYLATE 5 MG PO TABS: 5.0000 mg | ORAL_TABLET | Freq: Every day | ORAL | 3 refills | Status: DC

## 2018-03-26 NOTE — Progress Notes (Signed)
    MRN: [id number] DOB: 1961/03/29  Subjective:   Dawn Espinoza is a 57 y.o. female presenting for refills on her blood pressure and cholesterol medications. Denies chest pain, shortness of breath, heart racing, palpitations, nausea, vomiting, abdominal pain, hematuria, lower leg swelling.  She does admit that she ran out of her amlodipine and has been without it for almost a week.  Ports that she has had some headaches and dizziness since being off her amlodipine.  Denies smoking cigarettes or drinking alcohol.  Dawn Espinoza has a current medication list which includes the following prescription(s): amlodipine and atorvastatin. Also has No Known Allergies.  Dawn Espinoza  has a past medical history of HLD (hyperlipidemia) and Hypertension. Also  has a past surgical history that includes No past surgeries.  Objective:   Vitals: BP (!) 145/68   Pulse 78   Temp 98.1 F (36.7 C) (Oral)   Resp 18   Ht 5\' 1"  (1.549 m)   Wt 160 lb 6.4 oz (72.8 kg)   SpO2 94%   BMI 30.31 kg/m   BP Readings from Last 3 Encounters:  03/26/18 (!) 145/68  10/20/17 132/68  10/17/17 130/80    Physical Exam  Constitutional: She is oriented to person, place, and time. She appears well-developed and well-nourished.  HENT:  Mouth/Throat: Oropharynx is clear and moist.  Eyes: Pupils are equal, round, and reactive to light. EOM are normal. No scleral icterus.  Cardiovascular: Normal rate, regular rhythm and intact distal pulses. Exam reveals no gallop and no friction rub.  No murmur heard. Pulmonary/Chest: No respiratory distress. She has no wheezes. She has no rales.  Abdominal: Soft. Bowel sounds are normal. She exhibits no distension and no mass. There is no tenderness. There is no rebound and no guarding.  Neurological: She is alert and oriented to person, place, and time. She displays normal reflexes. No cranial nerve deficit. Coordination normal.  Skin: Skin is warm and dry.  Psychiatric: She has a normal mood and  affect.   Assessment and Plan :   Essential hypertension - Plan: Comprehensive metabolic panel  Elevated blood pressure reading  Hyperlipidemia, unspecified hyperlipidemia type  Generalized headaches  Dizziness  Chronic pain of both knees  Patient is to restart her amlodipine and atorvastatin.  Labs pending.  Follow-up in 6 months unless her blood pressure remains elevated even though she has restarted amlodipine.  Just this patient was about to leave the building, she asked me for pain medication for her knees.  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  She states that she tried medicine that her friend gave her that was supposed to be anti-inflammatory and it helped.  I recommended she start meloxicam once daily for this.  She is to come back for clinic for recheck as I was not able to examine her for this particular complaint given that we had already left the exam room.  Jaynee Eagles, PA-C Primary [facility] at [facility] [phone] 03/26/2018  5:27 PM

## 2018-03-26 NOTE — Patient Instructions (Signed)
Hipertensin Hypertension La hipertensin, conocida comnmente como presin arterial alta, se produce cuando la sangre bombea en las arterias con mucha fuerza. Las arterias son los vasos sanguneos que transportan la sangre desde el corazn al resto del cuerpo. La hipertensin hace que el corazn haga ms esfuerzo para Chiropodist y [employer] Corporation que las arterias se Teacher, music o Advertising account executive. La hipertensin no tratada o no controlada puede causar infarto de miocardio, accidentes cerebrovasculares, enfermedad renal y otros problemas. Una lectura de la presin arterial consiste de un nmero ms alto sobre un nmero ms bajo. En condiciones ideales, la presin arterial debe estar por debajo de 120/80. El primer nmero ("superior") es la presin sistlica. Es la medida de la presin de las arterias cuando el corazn late. El segundo nmero ("inferior") es la presin diastlica. Es la medida de la presin en las arterias cuando el corazn se relaja. Cules son las causas? Se desconoce la causa de esta afeccin. Qu incrementa el riesgo? Algunos factores de riesgo de hipertensin estn bajo su control. Otros no. Factores que puede [employer].  Tener diabetes mellitus tipo 2, colesterol alto, o ambos.  No hacer la cantidad suficiente de actividad fsica o ejercicio.  Tener sobrepeso.  Consumir mucha grasa, azcar, caloras o sal (sodio) en su dieta.  Beber alcohol en exceso. Factores que son difciles o imposibles de modificar  Tener enfermedad renal crnica.  Tener antecedentes familiares de presin arterial alta.  La edad. Los riesgos aumentan con la edad.  La raza. El riesgo es mayor para las Retail banker.  El sexo. Antes de los 45aos, los hombres corren ms [employer]. Despus de los 65aos, las mujeres corren ms [employer].  Tener apnea obstructiva del sueo.  El estrs. Cules son los signos o los sntomas? La presin arterial  extremadamente alta (crisis hipertensiva) puede provocar:  Dolor de [place].  Ansiedad.  Falta de aire.  Hemorragia nasal.  Nuseas y vmitos.  Dolor de pecho intenso.  Una crisis de movimientos que no puede controlar (convulsiones).  Cmo se diagnostica? Esta afeccin se diagnostica midiendo su presin arterial mientras se encuentra sentado, con el brazo apoyado sobre una superficie. El brazalete del tensimetro debe colocarse directamente sobre la piel de la parte superior del brazo y al nivel de su corazn. Debe medirla al [facility] veces en el mismo brazo. Determinadas condiciones pueden causar una diferencia de presin arterial entre el brazo izquierdo y Insurance underwriter. Ciertos factores pueden provocar que las lecturas de la presin arterial sean inferiores o superiores a lo normal (elevadas) por un perodo corto de tiempo:  Si su presin arterial es ms alta cuando se encuentra en el consultorio del mdico que cuando la mide en su hogar, se denomina "hipertensin de bata blanca". La State Farm de las personas que tienen esta afeccin no deben ser [employer].  Si su presin arterial es ms alta en el hogar que cuando se encuentra en el consultorio del mdico, se denomina "hipertensin enmascarada". La State Farm de las personas que tienen esta afeccin deben ser medicadas para Chief Technology Officer la presin arterial.  Si tiene una lecturas de presin arterial alta durante una visita o si tiene presin arterial normal con otros factores de riesgo:  Es posible que se le pida que regrese Administrator, arts para volver a Chief Technology Officer su presin arterial.  Se le puede pedir que se controle la presin arterial en su casa durante 1 semana o ms.  Si se le diagnostica hipertensin, es posible que  se le realicen otros anlisis de sangre o estudios de diagnstico por imgenes para ayudar a su mdico a comprender su riesgo general de tener otras afecciones. Cmo se trata? Esta afeccin se trata haciendo cambios saludables  en el estilo de vida, tales como ingerir alimentos saludables, realizar ms ejercicio y reducir el consumo de alcohol. El mdico puede recetarle medicamentos si los cambios en el estilo de vida no son suficientes para Child psychotherapist la presin arterial y si:  Su presin arterial sistlica est por encima de 130.  Su presin arterial diastlica est por encima de 80.  La presin arterial deseada puede variar en funcin de las enfermedades, la edad y otros factores personales. Siga estas instrucciones en su casa: Comida y bebida  Siga una dieta con alto contenido de fibras y [place], y con bajo contenido de sodio, Location manager agregada y Physicist, medical. Un ejemplo de plan alimenticio es la dieta DASH (Dietary Approaches to Stop Hypertension, Mtodos alimenticios para detener la hipertensin). Para alimentarse de esta manera: ? Coma mucha fruta y [place]. Trate de que la mitad del plato de cada comida sea de frutas y verduras. ? Coma cereales integrales, como pasta integral, arroz integral y pan integral. Llene aproximadamente un cuarto del plato con cereales integrales. ? Coma y beba productos lcteos con bajo contenido de grasa, como leche descremada o yogur bajo en grasas. ? Evite la ingesta de cortes de carne grasa, carne procesada o curada, y carne de ave con piel. Llene aproximadamente un cuarto del plato con protenas magras, como pescado, pollo sin piel, frijoles, huevos y tofu. ? Evite ingerir alimentos prehechos o procesados. En general, estos tienen mayor cantidad de sodio, azcar agregada y Wendee Copp.  Reduzca su ingesta diaria de sodio. La mayora de las personas que tienen hipertensin deben comer menos de 1500 mg de sodio por SunTrust.  Limite el consumo de alcohol a no ms de 1 medida por da si es mujer y no est Music therapist y a 2 medidas por da si es hombre. Una medida equivale a 12onzas de cerveza, 5onzas de vino o 1onzas de bebidas alcohlicas de alta graduacin. Estilo de vida  Trabaje  con su mdico para mantener un peso saludable o Administrator, Civil Service. Pregntele cual es su peso recomendado.  Realice al menos 30 minutos de ejercicio que haga que se acelere su corazn (ejercicio Arboriculturist) la [employer] Financial de la [place]. Estas actividades pueden incluir caminar, nadar o andar en bicicleta.  Incluya ejercicios para fortalecer sus msculos (ejercicios de resistencia), como pilates o levantamiento de pesas, como parte de su rutina semanal de ejercicios. Intente realizar 14mnutos de este tipo de ejercicios al mSolectron Corporationa la s[place]  No consuma ningn producto que contenga nicotina o tabaco, como cigarrillos y cPsychologist, sport and exercise Si necesita ayuda para dejar de fumar, consulte al mdico.  Contrlese la presin arterial en su casa segn las indicaciones del mdico.  Concurra a todas las visitas de control como se lo haya indicado el mdico. Esto es importante. Medicamentos  TDelphide venta libre y los recetados solamente como se lo haya indicado el mdico. Siga cuidadosamente las indicaciones. Los medicamentos para la presin arterial deben tomarse segn las indicaciones.  No omita las dosis de medicamentos para la presin arterial. Si lo hace, estar en riesgo de tener problemas y puede hacer que los medicamentos sean menos eficaces.  Pregntele a su mdico a qu efectos secundarios o reacciones a los mCareers information officer Comunquese con  un mdico si:  Piensa que tiene una reaccin a un medicamento que est tomando.  Tiene dolores de cabeza frecuentes (recurrentes).  Siente mareos.  Tiene hinchazn en los tobillos.  Tiene problemas de visin. Solicite ayuda de inmediato si:  Siente un dolor de cabeza intenso o confusin.  Siente debilidad inusual o adormecimiento.  Siente que va a desmayarse.  Siente un dolor intenso en el pecho o el abdomen.  Vomita repetidas veces.  Tiene dificultad para respirar. Resumen  La  hipertensin se produce cuando la sangre bombea en las arterias con mucha fuerza. Si esta afeccin no se controla, podra correr riesgo de tener complicaciones graves.  La presin arterial deseada puede variar en funcin de las enfermedades, la edad y otros factores personales. Para la mayora de las personas, una presin arterial normal es menor que 120/80.  La hipertensin se trata con cambios en el estilo de vida, medicamentos o una combinacin de ambos. Los cambios en el estilo de vida incluyen prdida de peso, ingerir alimentos sanos, seguir una dieta baja en sodio, hacer ms ejercicio y limitar el consumo de alcohol. Esta informacin no tiene como fin reemplazar el consejo del mdico. Asegrese de hacerle al mdico cualquier pregunta que tenga. Document Released: 11/04/2005 Document Revised: 10/16/2016 Document Reviewed: 10/16/2016 Elsevier Interactive Patient Education  2018 Elsevier Inc.     IF you received an x-ray today, you will receive an invoice from [facility]. Please contact [facility] at [phone] with questions or concerns regarding your invoice.   IF you received labwork today, you will receive an invoice from [facility]. Please contact [facility] at [phone] with questions or concerns regarding your invoice.   Our billing staff will not be able to assist you with questions regarding bills from these companies.  You will be contacted with the lab results as soon as they are available. The fastest way to get your results is to activate your My Chart account. Instructions are located on the last page of this paperwork. If you have not heard from us regarding the results in 2 weeks, please contact this office.     

## 2018-03-27 LAB — COMPREHENSIVE METABOLIC PANEL
ALT: 17 IU/L (ref 0–32)
AST: 16 IU/L (ref 0–40)
Albumin/Globulin Ratio: 1.8 (ref 1.2–2.2)
Albumin: 4.4 g/dL (ref 3.5–5.5)
Alkaline Phosphatase: 94 IU/L (ref 39–117)
BUN/Creatinine Ratio: 16 (ref 9–23)
BUN: 13 mg/dL (ref 6–24)
Bilirubin Total: <0.2 mg/dL (ref 0.0–1.2)
CO2: 23 mmol/L (ref 20–29)
Calcium: 9.1 mg/dL (ref 8.7–10.2)
Chloride: 106 mmol/L (ref 96–106)
Creatinine, Ser: 0.83 mg/dL (ref 0.57–1.00)
GFR calc Af Amer: 91 mL/min/{1.73_m2} (ref >59)
GFR calc non Af Amer: 79 mL/min/{1.73_m2} (ref >59)
Globulin, Total: 2.4 g/dL (ref 1.5–4.5)
Glucose: 161 mg/dL — ABNORMAL HIGH (ref 65–99)
Potassium: 3.8 mmol/L (ref 3.5–5.2)
Sodium: 144 mmol/L (ref 134–144)
Total Protein: 6.8 g/dL (ref 6.0–8.5)

## 2018-03-30 LAB — HEMOGLOBIN A1C
Est. average glucose Bld gHb Est-mCnc: 143 mg/dL
Hgb A1c MFr Bld: 6.6 % — ABNORMAL HIGH (ref 4.8–5.6)

## 2018-03-30 NOTE — Addendum Note (Signed)
Addended by: Ileana Roup on: 03/30/2018 08:56 AM   Modules accepted: Orders

## 2018-04-10 ENCOUNTER — Other Ambulatory Visit: Payer: Self-pay

## 2018-04-10 ENCOUNTER — Ambulatory Visit (INDEPENDENT_AMBULATORY_CARE_PROVIDER_SITE_OTHER): Payer: BLUE CROSS/BLUE SHIELD

## 2018-04-10 ENCOUNTER — Ambulatory Visit: Payer: BLUE CROSS/BLUE SHIELD | Admitting: Urgent Care

## 2018-04-10 ENCOUNTER — Encounter: Payer: Self-pay | Admitting: Urgent Care

## 2018-04-10 VITALS — BP 134/78 | HR 85 | Temp 98.6°F | Resp 18 | Ht 61.0 in | Wt 156.8 lb

## 2018-04-10 DIAGNOSIS — W19XXXA Unspecified fall, initial encounter: Secondary | ICD-10-CM | POA: Diagnosis not present

## 2018-04-10 DIAGNOSIS — I10 Essential (primary) hypertension: Secondary | ICD-10-CM | POA: Diagnosis not present

## 2018-04-10 DIAGNOSIS — S20222A Contusion of left back wall of thorax, initial encounter: Secondary | ICD-10-CM | POA: Diagnosis not present

## 2018-04-10 DIAGNOSIS — M545 Low back pain, unspecified: Secondary | ICD-10-CM

## 2018-04-10 MED ORDER — PREDNISONE 10 MG PO TABS: 30.0000 mg | ORAL_TABLET | Freq: Every day | ORAL | 0 refills | Status: DC

## 2018-04-10 MED ORDER — CYCLOBENZAPRINE HCL 5 MG PO TABS: 5.0000 mg | ORAL_TABLET | Freq: Every day | ORAL | 1 refills | Status: DC

## 2018-04-10 NOTE — Patient Instructions (Addendum)
Dolor de espalda en adultos Back Pain, Adult Muchos adultos sufren dolor de espalda de vez en cuando. Las causas comunes del dolor de espalda son las siguientes:  Ardelia Mems distensin del msculo o el ligamento.  Desgaste (degeneracin) de los discos vertebrales.  Artritis.  Un golpe en la espalda.  El dolor de espalda puede ser breve (agudo) o prolongado (crnico). Es posible que se realicen un examen fsico, anlisis de laboratorio u otros estudios de diagnstico por imgenes para Animator causa del Social research officer, government. Siga estas indicaciones en su casa: Control del dolor y de la rigidez  [employer] Corporation medicamentos de venta libre y los recetados solamente como se lo haya indicado el mdico.  Si se lo indican, aplique calor en la zona afectada con la frecuencia que le haya indicado el mdico. Use la fuente de calor que el mdico le recomiende, como una compresa de calor hmedo o una almohadilla trmica. ? Colquese una [employer] piel y la fuente de Freight forwarder. ? Aplique el calor durante 20 a 15minutos. ? Retire la fuente de calor si la piel se le pone de color rojo brillante. Esto es muy importante si no puede sentir el dolor, el calor ni el fro. Corre un mayor riesgo de sufrir quemaduras.  Si se lo indican, aplique hielo sobre la zona lesionada: ? Ponga el hielo en una bolsa plstica. ? Coloque una [employer] piel y la bolsa de hielo. ? Deje el hielo durante 35minutos, de 2 a 3veces por da, durante los primeros 2 o 3das. Actividad  No permanezca en la cama. Si hace reposo ms de 1 a 2 das, puede demorar su recuperacin.  Realice caminatas cortas en superficies planas tan pronto como le sea posible. Trate de caminar un poco ms de Publishing copy.  No se siente, conduzca o permanezca de pie en un mismo lugar durante ms de 30 minutos seguidos. Pararse o sentarse durante largos perodos de Radiographer, therapeutic la espalda.  Use tcnicas apropiadas para levantar objetos. Cuando se  inclina y Chief Executive Officer un [place], utilice posiciones que no sobrecarguen tanto la espalda: ? [place]. ? Mantenga la carga cerca del cuerpo. ? No se tuerza.  Haga actividad fsica habitualmente como se lo haya indicado el mdico. Hacer ejercicio ayudar a que su espalda se cure ms rpido. Adems, esto ayuda a prevenir lesiones de espalda al PepsiCo fuertes y flexibles.  El mdico puede recomendarle que consulte a un fisioterapeuta. Esta persona puede ayudarlo a elaborar un programa de ejercicios seguro. Haga ejercicios como se lo haya indicado el fisioterapeuta. Estilo de vida  Mantenga un peso saludable. El sobrepeso sobrecarga la espalda y hace que resulte difcil tener una buena [place].  Evite actividades o situaciones que lo hagan sentirse ansioso o estresado. Aprenda maneras de manejar la ansiedad y el estrs. Una forma de controlar el estrs es a travs del ejercicio. El estrs y la ansiedad aumentan la tensin muscular y pueden empeorar el dolor de espalda. Instrucciones generales  Duerma sobre un colchn firme en una posicin cmoda. Intente acostarse de costado, con las rodillas ligeramente flexionadas. Si se recuesta [employer], coloque una almohada debajo de las rodillas.  Siga el plan de tratamiento como se lo haya indicado el mdico. Esto puede incluir lo siguiente: ? Terapia cognitiva o conductual. ? Acupuntura o terapia de masajes. ? Yoga o meditacin. Comunquese con un mdico si:  Siente un dolor que no se alivia con reposo o medicamentos.  Siente mucho dolor que se extiende a las piernas o las nalgas.  El dolor no mejora en 2 semanas.  Siente dolor por la noche.  Pierde peso.  Tiene fiebre o siente escalofros. Solicite ayuda de inmediato si:  Tiene nuevos problemas para controlar la vejiga o los intestinos.  Siente debilidad o adormecimiento inusuales en los brazos o en las piernas.  Siente nuseas o vmitos.  Siente dolor  abdominal.  Siente que va a desmayarse. Resumen  Muchos adultos sufren dolor de espalda de vez en cuando. Es posible que se realicen un examen fsico, anlisis de laboratorio u otros estudios de diagnstico por imgenes para Animator causa del Social research officer, government.  Use tcnicas apropiadas para levantar objetos. Cuando se inclina y levanta un [place], utilice posiciones que no sobrecarguen tanto la espalda.  Tome los medicamentos de venta libre o recetados y aplique calor o hielo solamente como se lo haya indicado el mdico. Esta informacin no tiene Marine scientist el consejo del mdico. Asegrese de hacerle al mdico cualquier pregunta que tenga. Document Released: 11/04/2005 Document Revised: 02/24/2017 Document Reviewed: 02/24/2017 Elsevier Interactive Patient Education  2018 [employer].     IF you received an x-ray today, you will receive an invoice from [facility]. Please contact [facility] at [phone] with questions or concerns regarding your invoice.   IF you received labwork today, you will receive an invoice from [facility]. Please contact [facility] at [phone] with questions or concerns regarding your invoice.   Our billing staff will not be able to assist you with questions regarding bills from these companies.  You will be contacted with the lab results as soon as they are available. The fastest way to get your results is to activate your My Chart account. Instructions are located on the last page of this paperwork. If you have not heard from [place] regarding the results in 2 weeks, please contact this office.

## 2018-04-10 NOTE — Progress Notes (Signed)
    MRN: [id number] DOB: 03/24/1961  Subjective:   Dawn Espinoza is a 57 y.o. female presenting for suffering a fall 5 days ago.  Patient tried to sit in the chair and subsequently fell to carpet floor rolled onto a couch.  She has since had pretty persistent mid to low back pain.  Denies deformity, swelling, bruising, weakness, dysuria, urinary frequency.  She has been using a medication that she had from a previous prescription but does not remember the name.  She did pick up meloxicam that I prescribed to her last office visit on 03/26/2018 for bilateral knee pain for her arthritis.  But she did not start this.  Patient is requesting a really strong anti-inflammatory medication.  Dawn Espinoza has a current medication list which includes the following prescription(s): amlodipine, atorvastatin, and meloxicam. Also has No Known Allergies.  Dawn Espinoza  has a past medical history of HLD (hyperlipidemia) and Hypertension. Also  has a past surgical history that includes No past surgeries.  Objective:   Vitals: BP 134/78 (BP Location: Right Arm, Patient Position: Sitting, Cuff Size: Normal)   Pulse 85   Temp 98.6 F (37 C) (Oral)   Resp 18   Ht 5\' 1"  (1.549 m)   Wt 156 lb 12.8 oz (71.1 kg)   SpO2 97%   BMI 29.63 kg/m   Physical Exam  Constitutional: She is oriented to person, place, and time. She appears well-developed and well-nourished.  Cardiovascular: Normal rate.  Pulmonary/Chest: Effort normal.  Musculoskeletal:       Lumbar back: She exhibits decreased range of motion and tenderness. She exhibits no bony tenderness, no swelling, no edema, no deformity, no laceration and no spasm.       Back:  Neurological: She is alert and oriented to person, place, and time. She displays normal reflexes.   Dg Lumbar Spine Complete  Result Date: 04/10/2018 CLINICAL DATA:  57 y/o F; acute midline lower back pain without sciatica. EXAM: LUMBAR SPINE - COMPLETE 4+ VIEW COMPARISON:  09/25/2016 CT abdomen and  pelvis. FINDINGS: Mild lumbar levocurvature with apex at L3. normal lumbar lordosis without listhesis. Vertebral body and disc space heights are maintained. Mild lower lumbar facet arthrosis. Calcific atherosclerosis of the aorta. IMPRESSION: Stable mild lumbar levocurvature and lower lumbar facet arthrosis. No loss of vertebral of disc space height. Aortic atherosclerosis. Electronically Signed   By: Kristine Garbe M.D.   On: 04/10/2018 17:07   Dg Sacrum/coccyx  Result Date: 04/10/2018 CLINICAL DATA:  57 y/o F; acute midline lower back pain without sciatica. EXAM: SACRUM AND COCCYX - 2+ VIEW COMPARISON:  09/25/2016 CT abdomen and pelvis. FINDINGS: There is no evidence of fracture or other focal bone lesions. IMPRESSION: Negative. Electronically Signed   By: Kristine Garbe M.D.   On: 04/10/2018 17:09     Assessment and Plan :   Acute midline low back pain without sciatica - Plan: DG Lumbar Spine Complete, DG Sacrum/Coccyx  Contusion of left side of back, initial encounter  Fall, initial encounter  Essential hypertension  Radiology report reassuring.  I recommended patient start meloxicam and use Flexeril as well.  I did provide her with prescription for prednisone in case her pain is not improving with conservative management.  She is to hydrate very well.  Wrote for a work note for her.  Return to clinic precautions discussed.  Jaynee Eagles, PA-C [facility] [phone] 04/10/2018  4:40 PM

## 2019-01-08 ENCOUNTER — Other Ambulatory Visit: Payer: Self-pay

## 2019-01-08 ENCOUNTER — Emergency Department (HOSPITAL_COMMUNITY): Payer: BLUE CROSS/BLUE SHIELD

## 2019-01-08 ENCOUNTER — Encounter (HOSPITAL_COMMUNITY): Payer: Self-pay

## 2019-01-08 ENCOUNTER — Emergency Department (HOSPITAL_COMMUNITY)
Admission: EM | Admit: 2019-01-08 | Discharge: 2019-01-08 | Disposition: A | Discharge status: Home / Self Care | Payer: BLUE CROSS/BLUE SHIELD | Attending: Emergency Medicine | Admitting: Emergency Medicine

## 2019-01-08 DIAGNOSIS — E876 Hypokalemia: Secondary | ICD-10-CM

## 2019-01-08 DIAGNOSIS — Z79899 Other long term (current) drug therapy: Secondary | ICD-10-CM | POA: Diagnosis not present

## 2019-01-08 DIAGNOSIS — R1011 Right upper quadrant pain: Secondary | ICD-10-CM | POA: Diagnosis not present

## 2019-01-08 DIAGNOSIS — I10 Essential (primary) hypertension: Secondary | ICD-10-CM | POA: Insufficient documentation

## 2019-01-08 LAB — URINALYSIS, ROUTINE W REFLEX MICROSCOPIC
Bilirubin Urine: NEGATIVE
Glucose, UA: NEGATIVE mg/dL
Ketones, ur: NEGATIVE mg/dL
Nitrite: NEGATIVE
Protein, ur: NEGATIVE mg/dL
Specific Gravity, Urine: 1.005 (ref 1.005–1.030)
pH: 6 (ref 5.0–8.0)

## 2019-01-08 LAB — COMPREHENSIVE METABOLIC PANEL
ALT: 39 U/L (ref 0–44)
AST: 37 U/L (ref 15–41)
Albumin: 4.3 g/dL (ref 3.5–5.0)
Alkaline Phosphatase: 80 U/L (ref 38–126)
Anion gap: 12 (ref 5–15)
BUN: 10 mg/dL (ref 6–20)
CO2: 24 mmol/L (ref 22–32)
Calcium: 9.6 mg/dL (ref 8.9–10.3)
Chloride: 103 mmol/L (ref 98–111)
Creatinine, Ser: 0.77 mg/dL (ref 0.44–1.00)
GFR calc Af Amer: >60 mL/min (ref >60)
GFR calc non Af Amer: >60 mL/min (ref >60)
Glucose, Bld: 170 mg/dL — ABNORMAL HIGH (ref 70–99)
Potassium: 3.2 mmol/L — ABNORMAL LOW (ref 3.5–5.1)
Sodium: 139 mmol/L (ref 135–145)
Total Bilirubin: 0.7 mg/dL (ref 0.3–1.2)
Total Protein: 7.7 g/dL (ref 6.5–8.1)

## 2019-01-08 LAB — CBC
HCT: 42.6 % (ref 36.0–46.0)
Hemoglobin: 13.3 g/dL (ref 12.0–15.0)
MCH: 29.4 pg (ref 26.0–34.0)
MCHC: 31.2 g/dL (ref 30.0–36.0)
MCV: 94 fL (ref 80.0–100.0)
Platelets: 261 10*3/uL (ref 150–400)
RBC: 4.53 MIL/uL (ref 3.87–5.11)
RDW: 13.1 % (ref 11.5–15.5)
WBC: 7.4 10*3/uL (ref 4.0–10.5)
nRBC: 0 % (ref 0.0–0.2)

## 2019-01-08 LAB — LIPASE, BLOOD: Lipase: 43 U/L (ref 11–51)

## 2019-01-08 MED ORDER — ALUM & MAG HYDROXIDE-SIMETH 200-200-20 MG/5ML PO SUSP
30.0000 mL | Freq: Once | ORAL | Status: AC
  Administered 2019-01-08: 30 mL via ORAL
  Filled 2019-01-08: qty 30

## 2019-01-08 MED ORDER — SODIUM CHLORIDE 0.9% FLUSH: 3.0000 mL | Freq: Once | INTRAVENOUS | Status: DC

## 2019-01-08 MED ORDER — SUCRALFATE 1 G PO TABS: 1.0000 g | ORAL_TABLET | Freq: Three times a day (TID) | ORAL | 0 refills | Status: DC

## 2019-01-08 MED ORDER — OMEPRAZOLE 20 MG PO CPDR: 20.0000 mg | DELAYED_RELEASE_CAPSULE | Freq: Every day | ORAL | 0 refills | Status: DC

## 2019-01-08 MED ORDER — LIDOCAINE VISCOUS HCL 2 % MT SOLN
15.0000 mL | Freq: Once | OROMUCOSAL | Status: AC
  Administered 2019-01-08: 15 mL via ORAL
  Filled 2019-01-08: qty 15

## 2019-01-08 MED ORDER — POTASSIUM CHLORIDE CRYS ER 20 MEQ PO TBCR: 20.0000 meq | EXTENDED_RELEASE_TABLET | Freq: Two times a day (BID) | ORAL | 0 refills | Status: DC

## 2019-01-08 NOTE — ED Notes (Signed)
Patient verbalizes understanding of discharge instructions. Opportunity for questioning and answers were provided. Patient ambulatory for discharged from ED via POV

## 2019-01-08 NOTE — ED Provider Notes (Signed)
[facility] EMERGENCY DEPARTMENT Provider Note   CSN: [id number] Arrival date & time: 01/08/19  1735    History   Chief Complaint Chief Complaint  Patient presents with  . Abdominal Pain    HPI Dawn Espinoza is a 58 y.o. female history of hypertension and hyperlipidemia presenting to emergency department today with chief complaint of abdominal pain x1 month.  Patient reports her pain is located in right upper quadrant and has progressively gotten worse over the last week.  She describes the pain as sharp and that it radiates to her right shoulder.  She has taken Tylenol for the pain that provides transient relief.  Patient states the pain is worse after eating fatty foods.  Patient was seen by her PCP who recommended further evaluation of her gallbladder. Denies nausea, vomiting, diarrhea, fever, chills, back pain, chest pain, shortness of breath, urinary symptoms.  Patient denies history of abdominal surgeries.   History obtained via [place] interpreter.      Past Medical History:  Diagnosis Date  . HLD (hyperlipidemia)   . Hypertension     There are no active problems to display for this patient.   Past Surgical History:  Procedure Laterality Date  . NO PAST SURGERIES       OB History    Gravida  0   Para  0   Term  0   Preterm  0   AB  0   Living        SAB  0   TAB  0   Ectopic  0   Multiple      Live Births               Home Medications    Prior to Admission medications   Medication Sig Start Date End Date Taking? Authorizing Provider  amLODipine (NORVASC) 5 MG tablet Take 1 tablet (5 mg total) by mouth daily. 03/26/18   Jaynee Eagles, PA-C  atorvastatin (LIPITOR) 20 MG tablet Take 1 tablet (20 mg total) by mouth daily. 03/26/18   Jaynee Eagles, PA-C  cyclobenzaprine (FLEXERIL) 5 MG tablet Take 1-2 tablets (5-10 mg total) by mouth at bedtime. 04/10/18   Jaynee Eagles, PA-C  meloxicam (MOBIC) 7.5 MG tablet Take 1 tablet (7.5 mg  total) by mouth daily. 03/26/18   Jaynee Eagles, PA-C  predniSONE (DELTASONE) 10 MG tablet Take 3 tablets (30 mg total) by mouth daily with breakfast. 04/10/18   Jaynee Eagles, PA-C    Family History Family History  Problem Relation Age of Onset  . Hypertension Mother   . Alcohol abuse Father   . Heart disease Father     Social History Social History   Tobacco Use  . Smoking status: Never Smoker  . Smokeless tobacco: Never Used  Substance Use Topics  . Alcohol use: No    Alcohol/week: 0.0 standard drinks  . Drug use: No     Allergies   Patient has no known allergies.   Review of Systems Review of Systems  Gastrointestinal: Positive for abdominal pain.  All other systems reviewed and are negative.    Physical Exam Updated Vital Signs BP (!) 147/57 (BP Location: Left Arm)   Pulse 73   Temp 97.9 F (36.6 C) (Oral)   Resp 16   SpO2 98%   Physical Exam Vitals signs and nursing note reviewed.  Constitutional:      Appearance: She is not ill-appearing or toxic-appearing.  HENT:     Head: Normocephalic and  atraumatic.     Nose: Nose normal.     Mouth/Throat:     Mouth: Mucous membranes are moist.     Pharynx: Oropharynx is clear.  Eyes:     Conjunctiva/sclera: Conjunctivae normal.  Neck:     Musculoskeletal: Normal range of motion.  Cardiovascular:     Rate and Rhythm: Normal rate and regular rhythm.     Pulses: Normal pulses.     Heart sounds: Normal heart sounds.  Pulmonary:     Effort: Pulmonary effort is normal.     Breath sounds: Normal breath sounds.  Abdominal:     General: Bowel sounds are normal. There is no distension.     Palpations: Abdomen is soft.     Tenderness: There is abdominal tenderness in the right upper quadrant. There is no right CVA tenderness, left CVA tenderness, guarding or rebound. Negative signs include Murphy's sign, Rovsing's sign and McBurney's sign.  Musculoskeletal: Normal range of motion.  Skin:    General: Skin is warm and  dry.     Capillary Refill: Capillary refill takes less than 2 seconds.  Neurological:     Mental Status: She is alert and oriented to person, place, and time. Mental status is at baseline.  Psychiatric:        Behavior: Behavior normal.      ED Treatments / Results  Labs (all labs ordered are listed, but only abnormal results are displayed) Labs Reviewed  COMPREHENSIVE METABOLIC PANEL - Abnormal; Notable for the following components:      Result Value   Potassium 3.2 (*)    Glucose, Bld 170 (*)    All other components within normal limits  URINALYSIS, ROUTINE W REFLEX MICROSCOPIC - Abnormal; Notable for the following components:   Color, Urine STRAW (*)    Hgb urine dipstick SMALL (*)    Leukocytes,Ua MODERATE (*)    Bacteria, UA RARE (*)    All other components within normal limits  LIPASE, BLOOD  CBC    EKG None  Radiology No results found.  Procedures Procedures (including critical care time)  Medications Ordered in ED Medications  sodium chloride flush (NS) 0.9 % injection 3 mL (has no administration in time range)     Initial Impression / Assessment and Plan / ED Course  I have reviewed the triage vital signs and the nursing notes.  Pertinent labs & imaging results that were available during my care of the patient were reviewed by me and considered in my medical decision making (see chart for details).    Patient is overall well-appearing and is presenting with 1 month of right upper quadrant pain.  DDX includes biliary colic, cholelithiasis, cholecystitis less likely cholangitis given her presentation.  On exam she is tender in right upper quadrant and has positive Murphy sign.   Lab work today shows CBC unremarkable, Lipase of 43, and CMP shows hypokalemia of 3.2. UA with small hemoglobin, moderate leukocytes, 6-10 white blood cells with rare bacteria.  Patient denies urinary symptoms and UA is not overly suggestive of UTI. Abdominal US is pending.  At  shift change care was transferred to Dr. Ellender Hose who will follow pending studies, re-evaulate and determine disposition.  Will likely be discharged home if ultrasound is negative       Final Clinical Impressions(s) / ED Diagnoses   Final diagnoses:  RUQ pain    ED Discharge Orders    None       Cherre Robins, PA-C 01/08/19 2150  Duffy Bruce, MD 01/09/19 [phone]

## 2019-01-08 NOTE — ED Notes (Signed)
ED Provider at bedside. 

## 2019-01-08 NOTE — ED Notes (Signed)
Patient transported to [facility] 

## 2019-01-08 NOTE — ED Triage Notes (Signed)
Pt reports right sided abd pain for the past month but has gotten worse now. Pt sent here by PCP for further eval of her gallbladder. Pt denies n.v. Pain also radiates to her right shoulder. Spanish interpreter used for triage, .

## 2019-01-18 ENCOUNTER — Encounter: Payer: Self-pay | Admitting: *Deleted

## 2019-01-21 ENCOUNTER — Ambulatory Visit: Payer: BLUE CROSS/BLUE SHIELD | Admitting: Internal Medicine

## 2019-05-25 ENCOUNTER — Ambulatory Visit (HOSPITAL_COMMUNITY)
Admission: EM | Admit: 2019-05-25 | Discharge: 2019-05-25 | Disposition: A | Discharge status: Home / Self Care | Payer: BLUE CROSS/BLUE SHIELD | Attending: Emergency Medicine | Admitting: Emergency Medicine

## 2019-05-25 ENCOUNTER — Other Ambulatory Visit: Payer: Self-pay

## 2019-05-25 ENCOUNTER — Ambulatory Visit (INDEPENDENT_AMBULATORY_CARE_PROVIDER_SITE_OTHER): Payer: BLUE CROSS/BLUE SHIELD

## 2019-05-25 ENCOUNTER — Encounter (HOSPITAL_COMMUNITY): Payer: Self-pay

## 2019-05-25 DIAGNOSIS — R0789 Other chest pain: Secondary | ICD-10-CM

## 2019-05-25 DIAGNOSIS — W2211XA Striking against or struck by driver side automobile airbag, initial encounter: Secondary | ICD-10-CM

## 2019-05-25 DIAGNOSIS — I10 Essential (primary) hypertension: Secondary | ICD-10-CM

## 2019-05-25 DIAGNOSIS — M546 Pain in thoracic spine: Secondary | ICD-10-CM

## 2019-05-25 DIAGNOSIS — M25511 Pain in right shoulder: Secondary | ICD-10-CM

## 2019-05-25 MED ORDER — CYCLOBENZAPRINE HCL 5 MG PO TABS: 5.0000 mg | ORAL_TABLET | Freq: Two times a day (BID) | ORAL | 0 refills | Status: DC | PRN

## 2019-05-25 MED ORDER — NAPROXEN 500 MG PO TABS: 500.0000 mg | ORAL_TABLET | Freq: Two times a day (BID) | ORAL | 0 refills | Status: DC
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
## 2019-05-25 NOTE — Discharge Instructions (Signed)
Naprosyn dos veces cada dia para dolor Tambien puede [place] 'flexeril/cyclobenzaprine' para dolor en su cuello/espalda. Este es un relajante de musculos. Puede causa suenos, no manejar o trabajar despues de tomando  Regrese si sus sintomas no mejoran en los proximos semanas o mas peor, tiene falta de [place], debilidad

## 2019-07-14 DIAGNOSIS — K760 Fatty (change of) liver, not elsewhere classified: Secondary | ICD-10-CM | POA: Insufficient documentation

## 2019-07-14 DIAGNOSIS — Z0000 Encounter for general adult medical examination without abnormal findings: Secondary | ICD-10-CM | POA: Insufficient documentation

## 2019-07-14 DIAGNOSIS — Z96652 Presence of left artificial knee joint: Secondary | ICD-10-CM | POA: Insufficient documentation

## 2019-07-14 DIAGNOSIS — M1710 Unilateral primary osteoarthritis, unspecified knee: Secondary | ICD-10-CM | POA: Insufficient documentation

## 2019-07-14 DIAGNOSIS — E782 Mixed hyperlipidemia: Secondary | ICD-10-CM | POA: Insufficient documentation

## 2019-07-14 DIAGNOSIS — E669 Obesity, unspecified: Secondary | ICD-10-CM | POA: Insufficient documentation

## 2019-07-21 ENCOUNTER — Ambulatory Visit (HOSPITAL_COMMUNITY)
Admission: EM | Admit: 2019-07-21 | Discharge: 2019-07-21 | Discharge status: Absent without leave | Payer: BLUE CROSS/BLUE SHIELD

## 2019-09-01 DIAGNOSIS — R12 Heartburn: Secondary | ICD-10-CM | POA: Insufficient documentation

## 2019-09-01 DIAGNOSIS — Z86010 Personal history of colonic polyps: Secondary | ICD-10-CM | POA: Insufficient documentation

## 2019-09-01 DIAGNOSIS — R1011 Right upper quadrant pain: Secondary | ICD-10-CM | POA: Insufficient documentation

## 2019-09-01 DIAGNOSIS — Z550 Illiteracy and low-level literacy: Secondary | ICD-10-CM | POA: Insufficient documentation

## 2019-09-01 DIAGNOSIS — Z2821 Immunization not carried out because of patient refusal: Secondary | ICD-10-CM | POA: Insufficient documentation

## 2019-09-01 DIAGNOSIS — R748 Abnormal levels of other serum enzymes: Secondary | ICD-10-CM | POA: Insufficient documentation

## 2020-03-15 ENCOUNTER — Encounter: Payer: Self-pay | Admitting: Emergency Medicine

## 2020-03-15 ENCOUNTER — Ambulatory Visit (INDEPENDENT_AMBULATORY_CARE_PROVIDER_SITE_OTHER): Payer: 59 | Admitting: Emergency Medicine

## 2020-03-15 ENCOUNTER — Other Ambulatory Visit: Payer: Self-pay

## 2020-03-15 VITALS — BP 148/69 | HR 77 | Temp 97.8°F | Ht 63.0 in | Wt 159.0 lb

## 2020-03-15 DIAGNOSIS — Z1159 Encounter for screening for other viral diseases: Secondary | ICD-10-CM

## 2020-03-15 DIAGNOSIS — I10 Essential (primary) hypertension: Secondary | ICD-10-CM

## 2020-03-15 DIAGNOSIS — Z7689 Persons encountering health services in other specified circumstances: Secondary | ICD-10-CM | POA: Diagnosis not present

## 2020-03-15 DIAGNOSIS — R350 Frequency of micturition: Secondary | ICD-10-CM

## 2020-03-15 DIAGNOSIS — N390 Urinary tract infection, site not specified: Secondary | ICD-10-CM

## 2020-03-15 DIAGNOSIS — I152 Hypertension secondary to endocrine disorders: Secondary | ICD-10-CM

## 2020-03-15 DIAGNOSIS — E1169 Type 2 diabetes mellitus with other specified complication: Secondary | ICD-10-CM | POA: Diagnosis not present

## 2020-03-15 DIAGNOSIS — E1165 Type 2 diabetes mellitus with hyperglycemia: Secondary | ICD-10-CM | POA: Diagnosis not present

## 2020-03-15 DIAGNOSIS — E785 Hyperlipidemia, unspecified: Secondary | ICD-10-CM

## 2020-03-15 DIAGNOSIS — E1159 Type 2 diabetes mellitus with other circulatory complications: Secondary | ICD-10-CM

## 2020-03-15 DIAGNOSIS — Z1231 Encounter for screening mammogram for malignant neoplasm of breast: Secondary | ICD-10-CM

## 2020-03-15 DIAGNOSIS — Z114 Encounter for screening for human immunodeficiency virus [HIV]: Secondary | ICD-10-CM

## 2020-03-15 LAB — POCT URINALYSIS DIP (MANUAL ENTRY)
Bilirubin, UA: NEGATIVE
Glucose, UA: NEGATIVE mg/dL
Ketones, POC UA: NEGATIVE mg/dL
Nitrite, UA: NEGATIVE
Protein Ur, POC: NEGATIVE mg/dL
Spec Grav, UA: 1.01 (ref 1.010–1.025)
Urobilinogen, UA: 0.2 E.U./dL
pH, UA: 6 (ref 5.0–8.0)

## 2020-03-15 LAB — POCT GLYCOSYLATED HEMOGLOBIN (HGB A1C): Hemoglobin A1C: 6.4 % — AB (ref 4.0–5.6)

## 2020-03-15 LAB — GLUCOSE, POCT (MANUAL RESULT ENTRY): POC Glucose: 95 mg/dl (ref 70–99)

## 2020-03-15 MED ORDER — SITAGLIPTIN PHOSPHATE 50 MG PO TABS: 50.0000 mg | ORAL_TABLET | Freq: Every day | ORAL | 3 refills | Status: DC

## 2020-03-15 MED ORDER — SULFAMETHOXAZOLE-TRIMETHOPRIM 400-80 MG PO TABS: 1.0000 | ORAL_TABLET | Freq: Two times a day (BID) | ORAL | 0 refills | Status: DC

## 2020-03-15 MED ORDER — AMLODIPINE BESYLATE 5 MG PO TABS: 5.0000 mg | ORAL_TABLET | Freq: Every day | ORAL | 3 refills | Status: DC

## 2020-03-15 MED ORDER — ATORVASTATIN CALCIUM 20 MG PO TABS: 20.0000 mg | ORAL_TABLET | Freq: Every day | ORAL | 3 refills | Status: DC

## 2020-03-15 NOTE — Progress Notes (Signed)
Dawn Espinoza 59 y.o.   Chief Complaint  Patient presents with  . Establish Care  . Diabetes    metformin upsets stomach  . Urinary Frequency    HISTORY OF PRESENT ILLNESS: This is a 59 y.o. female here to establish care with me.  Used to see PA [employer]. Has history of prediabetes on Metformin twice a day but developing too many GI side effects.  Does not tolerate it well.  Requesting different medication. Has history of hypertension on amlodipine 5 mg daily. Has history of dyslipidemia on atorvastatin 20 mg daily. Has history of chronic bilateral knee pain.  Had left knee replaced.  Right knee hurting now for several months.  Needs to follow-up with orthopedist. Complaining of urinary frequency and possible infection. "[place] de [place]".  HPI   Prior to Admission medications   Medication Sig Start Date End Date Taking? Authorizing Provider  amLODipine (NORVASC) 5 MG tablet Take 1 tablet (5 mg total) by mouth daily. 03/26/18  Yes Jaynee Eagles, PA-C  atorvastatin (LIPITOR) 20 MG tablet Take 1 tablet (20 mg total) by mouth daily. 03/26/18  Yes Jaynee Eagles, PA-C  metFORMIN (GLUCOPHAGE) 500 MG tablet Take 500 mg by mouth every morning. 11/21/19  Yes [provider]  cyclobenzaprine (FLEXERIL) 5 MG tablet Take 1-2 tablets (5-10 mg total) by mouth 2 (two) times daily as needed for muscle spasms. Patient not taking: Reported on 03/15/2020 05/25/19   Wieters, Madelynn Done C, PA-C  meloxicam (MOBIC) 7.5 MG tablet Take 1 tablet (7.5 mg total) by mouth daily. Patient not taking: Reported on 03/15/2020 03/26/18   Jaynee Eagles, PA-C  naproxen (NAPROSYN) 500 MG tablet Take 1 tablet (500 mg total) by mouth 2 (two) times daily. Patient not taking: Reported on 03/15/2020 05/25/19   Wieters, Madelynn Done C, PA-C  predniSONE (DELTASONE) 10 MG tablet Take 3 tablets (30 mg total) by mouth daily with breakfast. Patient not taking: Reported on 03/15/2020 04/10/18   Jaynee Eagles, PA-C    No Known Allergies  There are no problems  to display for this patient.   Past Medical History:  Diagnosis Date  . Hepatic steatosis   . HLD (hyperlipidemia)   . Hypertension   . Internal hemorrhoids     Past Surgical History:  Procedure Laterality Date  . NO PAST SURGERIES      Social History   Socioeconomic History  . Marital status: Single    Spouse name: Not on file  . Number of children: 6  . Years of education: Not on file  . Highest education level: Not on file  Occupational History  . Not on file  Tobacco Use  . Smoking status: Never Smoker  . Smokeless tobacco: Never Used  Substance and Sexual Activity  . Alcohol use: No    Alcohol/week: 0.0 standard drinks  . Drug use: No  . Sexual activity: Never  Other Topics Concern  . Not on file  Social History Narrative   ** Merged History Encounter **       Social Determinants of Health   Financial Resource Strain:   . Difficulty of Paying Living Expenses:   Food Insecurity:   . Worried About Charity fundraiser in the Last Year:   . Arboriculturist in the Last Year:   Transportation Needs:   . Film/video editor (Medical):   Marland Kitchen Lack of Transportation (Non-Medical):   Physical Activity:   . Days of Exercise per Week:   . Minutes of Exercise per  Session:   Stress:   . Feeling of Stress :   Social Connections:   . Frequency of Communication with Friends and Family:   . Frequency of Social Gatherings with Friends and Family:   . Attends Religious Services:   . Active Member of Clubs or Organizations:   . Attends Archivist Meetings:   Marland Kitchen Marital Status:   Intimate Partner Violence:   . Fear of Current or Ex-Partner:   . Emotionally Abused:   Marland Kitchen Physically Abused:   . Sexually Abused:     Family History  Problem Relation Age of Onset  . Hypertension Mother   . Alcohol abuse Father   . Heart disease Father      Review of Systems  Constitutional: Negative.  Negative for chills and fever.  HENT: Negative.  Negative for  congestion and sore throat.   Respiratory: Negative.  Negative for cough and shortness of breath.   Cardiovascular: Negative.  Negative for chest pain and palpitations.  Gastrointestinal: Negative.  Negative for abdominal pain, blood in stool, diarrhea, melena, nausea and vomiting.  Genitourinary: Positive for dysuria and frequency. Negative for flank pain and hematuria.  Musculoskeletal: Positive for joint pain (Both knees).  Skin: Negative.  Negative for rash.  Neurological: Negative for dizziness and headaches.  All other systems reviewed and are negative.   Today's Vitals   03/15/20 1511  BP: (!) 148/69  Pulse: 77  Temp: 97.8 F (36.6 C)  TempSrc: Temporal  SpO2: 98%  Weight: 159 lb (72.1 kg)  Height: '5\' 3"'  (1.6 m)   Body mass index is 28.17 kg/m.  Physical Exam Vitals reviewed.  Constitutional:      Appearance: Normal appearance.  HENT:     Head: Normocephalic.  Eyes:     Extraocular Movements: Extraocular movements intact.     Conjunctiva/sclera: Conjunctivae normal.     Pupils: Pupils are equal, round, and reactive to light.  Cardiovascular:     Rate and Rhythm: Normal rate and regular rhythm.     Pulses: Normal pulses.     Heart sounds: Normal heart sounds.  Pulmonary:     Effort: Pulmonary effort is normal.     Breath sounds: Normal breath sounds.  Musculoskeletal:        General: Normal range of motion.     Cervical back: Normal range of motion and neck supple.  Skin:    General: Skin is warm and dry.     Capillary Refill: Capillary refill takes less than 2 seconds.  Neurological:     General: No focal deficit present.     Mental Status: She is alert and oriented to person, place, and time.  Psychiatric:        Mood and Affect: Mood normal.        Behavior: Behavior normal.    Results for orders placed or performed in visit on 03/15/20 (from the past 24 hour(s))  POCT urinalysis dipstick     Status: Abnormal   Collection Time: 03/15/20  3:30 PM    Result Value Ref Range   Color, UA yellow yellow   Clarity, UA clear clear   Glucose, UA negative negative mg/dL   Bilirubin, UA negative negative   Ketones, POC UA negative negative mg/dL   Spec Grav, UA 1.010 1.010 - 1.025   Blood, UA small (A) negative   pH, UA 6.0 5.0 - 8.0   Protein Ur, POC negative negative mg/dL   Urobilinogen, UA 0.2 0.2 or 1.0 E.U./dL  Nitrite, UA Negative Negative   Leukocytes, UA Small (1+) (A) Negative  POCT glucose (manual entry)     Status: None   Collection Time: 03/15/20  3:54 PM  Result Value Ref Range   POC Glucose 95 70 - 99 mg/dl  POCT glycosylated hemoglobin (Hb A1C)     Status: Abnormal   Collection Time: 03/15/20  4:02 PM  Result Value Ref Range   Hemoglobin A1C 6.4 (A) 4.0 - 5.6 %   HbA1c POC (<> result, manual entry)     HbA1c, POC (prediabetic range)     HbA1c, POC (controlled diabetic range)     A total of 45 minutes was spent with the patient, greater than 50% of which was in counseling/coordination of care regarding chronic medical problems including diabetes, hypertension, and dyslipidemia, cardiovascular risks associated with this condition, review of office visit notes, review of past medical history, review of all medications, changes to medications, need to stop Metformin and start Januvia, UTI and need for antibiotic, diet and nutrition, health maintenance issues including mammogram and Pap smear, prognosis and need for follow-up in 3 months.   ASSESSMENT & PLAN: Hypertension associated with diabetes ([place]) Blood pressure slightly elevated.  Continue present medication.  No changes. Diabetes well controlled with hemoglobin A1c of 6.4.  Metformin creating too many GI side effects.  We will stop it and start Januvia 50 mg daily. Today patient has a UTI.  Will treat with antibiotics. Diet and nutrition discussed. Follow-up in 3 months.  Kingsley was seen today for establish care, diabetes and urinary frequency.  Diagnoses and all  orders for this visit:  Acute UTI -     sulfamethoxazole-trimethoprim (BACTRIM) 400-80 MG tablet; Take 1 tablet by mouth 2 (two) times daily for 7 days.  Encounter to establish care  Hypertension associated with diabetes ([place]) -     amLODipine (NORVASC) 5 MG tablet; Take 1 tablet (5 mg total) by mouth daily.  Dyslipidemia associated with type 2 diabetes mellitus (HCC) -     atorvastatin (LIPITOR) 20 MG tablet; Take 1 tablet (20 mg total) by mouth daily.  Essential hypertension -     CMP14+EGFR  Dyslipidemia -     Lipid panel  Type 2 diabetes mellitus with hyperglycemia, without long-term current use of insulin (HCC) -     POCT glucose (manual entry) -     POCT glycosylated hemoglobin (Hb A1C) -     sitaGLIPtin (JANUVIA) 50 MG tablet; Take 1 tablet (50 mg total) by mouth daily.  Urinary frequency -     Microalbumin / creatinine urine ratio -     POCT urinalysis dipstick -     Urine Culture  Encounter for screening mammogram for malignant neoplasm of breast -     MM Digital Screening; Future  Need for hepatitis C screening test -     Hepatitis C antibody  Screening for HIV (human immunodeficiency virus) -     HIV Antibody (routine testing w rflx)    Patient Instructions       If you have lab work done today you will be contacted with your lab results within the next 2 weeks.  If you have not heard from [place] then please contact us. The fastest way to get your results is to register for My Chart.   IF you received an x-ray today, you will receive an invoice from [facility] Radiology. Please contact [facility] Rad[facility] at [phone] with questions or concerns regarding your invoice.   IF  you received labwork today, you will receive an invoice from [facility]. Please contact [facility] at [phone] with questions or concerns regarding your invoice.   Our billing staff will not be able to assist you with questions regarding bills from these companies.  You will be  contacted with the lab results as soon as they are available. The fastest way to get your results is to activate your My Chart account. Instructions are located on the last page of this paperwork. If you have not heard from [place] regarding the results in 2 weeks, please contact this office.      Infeccin urinaria en los adultos Urinary Tract Infection, Adult Una infeccin urinaria (IU) puede ocurrir en Clinical cytogeneticist de las vas [place]. Las vas urinarias incluyen lo siguiente:  Los riones.  Los urteres.  La vejiga.  La uretra. Estos rganos fabrican, almacenan y eliminan el pis (orina) del cuerpo. Cules son las causas? La causa es la presencia de grmenes (bacterias) en la zona genital. Estos grmenes proliferan y causan hinchazn (inflamacin) de las vas urinarias. Qu incrementa el riesgo? Es ms probable que contraiga esta afeccin si:  Tiene colocado un tubo delgado y pequeo (catter) para drenar el pis.  Nopuede controlar la evacuacin de pis ni de materia fecal (incontinencia).  Es Art therapist y, adems: ? [place] estos mtodos para Therapist, occupational:  Un medicamento que [employer] espermatozoides (espermicida).  Un dispositivo que impide el paso de los espermatozoides (diafragma). ? Tieneniveles bajos de una hormona femenina (estrgeno). ? Est embarazada.  Tiene genes que [employer].  Es sexualmente activa.  Toma antibiticos.  Tiene dificultad para orinar debido a: ? Su prstata es ms grande de lo normal, si usted es hombre. ? Obstruccin en la parte del cuerpo que drena el pis de la vejiga (uretra). ? Clculo renal. ? Untrastorno nervioso que afecta la vejiga (vejiga neurgena). ? No bebe una cantidad suficiente de lquido. ? No hace pis con la frecuencia suficiente.  Tiene otras afecciones, como: ? Diabetes. ? Un sistema que combate las enfermedades (sistema inmunitario) debilitado. ? Anemia drepanoctica. ? Gota. ? Lesin en la columna  vertebral. Cules son los signos o los sntomas? Los sntomas de esta afeccin incluyen:  Necesidad inmediata (urgente) de hacer pis.  Hacer pis con frecuencia.  Hacer poca cantidad de pis con mucha frecuencia.  Dolor o ardor al [employer].  Sangre en el pis.  Pis que huele mal o anormal.  Dificultad para hacer pis.  Pis turbio.  Lquido que sale de la vagina, si es [place].  Dolor en la barriga o en la parte baja de la espalda. Otros sntomas pueden incluir los siguientes:  Vmitos.  No sentir deseos de comer.  Sentirse confundido (confuso).  Sentirse cansado y malhumorado (irritable).  Cristy Hilts.  Materia fecal lquida (diarrea). Cmo se trata? El tratamiento de esta afeccin puede incluir:  Antibiticos.  Otros medicamentos.  Beber una cantidad suficiente de agua. Sigue estas instrucciones en tu casa:  Medicamentos  [employer] de venta libre y los recetados solamente como se lo haya indicado el mdico.  Si le recetaron un antibitico, tmelo como se lo haya indicado el mdico. No deje de tomarlo aunque comience a sentirse mejor. Indicaciones generales  Asegrese de hacer lo siguiente: ? Haga pis hasta que la vejiga quede vaca. ? Nocontenga el pis durante mucho tiempo. ? Vace la vejiga despus de Clinical biochemist. ? Lmpiese de adelante hacia atrs despus de defecar, si es mujer. Use  cada trozo de papel higinico solo una vez cuando se limpie.  Beba suficiente lquido como para Theatre manager la orina de color amarillo plido.  Concurra a todas las visitas de seguimiento como se lo haya indicado el mdico. Esto es importante. Comunquese con un mdico si:  No mejora despus de 1 o 2das de tratamiento.  Los sntomas desaparecen y Teacher, adult education. Solicite ayuda inmediatamente si:  Tiene un dolor muy intenso en la espalda.  Tiene dolor muy intenso en la parte baja de la barriga.  Tener fiebre.  Tiene Higher education careers adviser  (nuseas).  Tiene vmitos. Resumen  Una infeccin urinaria (IU) puede ocurrir en Clinical cytogeneticist de las vas [place].  Esta afeccin es causada por la presencia de grmenes en la zona genital.  Existen muchos factores de riesgo de sufrir una IU. Estos incluyen tener colocado un tubo delgado y pequeo para drenar el pis y no poder controlar cundo hace pis y materia fecal.  El tratamiento incluye antibiticos contra los grmenes.  Beba suficiente lquido como para Theatre manager la orina de color amarillo plido. Esta informacin no tiene Marine scientist el consejo del mdico. Asegrese de hacerle al mdico cualquier pregunta que tenga. Document Revised: 10/28/2018 Document Reviewed: 10/28/2018 Elsevier Patient Education  2020 [employer].  Diabetes mellitus y nutricin, en adultos Diabetes Mellitus and Nutrition, Adult Si sufre de diabetes (diabetes mellitus), es muy importante tener hbitos alimenticios saludables debido a que sus niveles de Designer, television/film set sangre (glucosa) se ven afectados en gran medida por lo que come y bebe. [place] alimentos saludables en las cantidades [place], aproximadamente a la [employer], [place] ayudar a:  Aeronautical engineer glucemia.  Disminuir el riesgo de sufrir una enfermedad cardaca.  Mejorar la presin arterial.  Science writer o mantener un peso saludable. Todas las personas que sufren de diabetes son diferentes y cada una tiene necesidades diferentes en cuanto a un plan de alimentacin. El mdico puede recomendarle que trabaje con un especialista en dietas y nutricin (nutricionista) para Financial trader plan para usted. Su plan de alimentacin puede variar segn factores como:  Las caloras que necesita.  Los medicamentos que toma.  Su peso.  Sus niveles de glucemia, presin arterial y colesterol.  Su nivel de [place].  Otras afecciones que tenga, como enfermedades cardacas o renales. Cmo me afectan los carbohidratos? Los  carbohidratos, o hidratos de carbono, afectan su nivel de glucemia ms que cualquier otro tipo de alimento. La ingesta de carbohidratos naturalmente aumenta la cantidad de [employer]. El recuento de carbohidratos es un mtodo destinado a Catering manager un registro de la cantidad de carbohidratos que se consumen. El recuento de carbohidratos es importante para Theatre manager la glucemia a un nivel saludable, especialmente si utiliza insulina o toma determinados medicamentos por va oral para la diabetes. Es importante conocer la cantidad de carbohidratos que se pueden ingerir en cada comida sin correr Engineer, manufacturing. Esto es Psychologist, forensic. Su nutricionista puede ayudarlo a calcular la cantidad de carbohidratos que debe ingerir en cada comida y en cada refrigerio. Entre los alimentos que contienen carbohidratos, se incluyen:  Pan, cereal, arroz, pastas y galletas.  Papas y maz.  Guisantes, frijoles y lentejas.  Leche y Estate agent.  Lambert Mody y [place].  Postres, como pasteles, galletas, helado y caramelos. Cmo me afecta el alcohol? El alcohol puede provocar disminuciones sbitas de la glucemia (hipoglucemia), especialmente si utiliza insulina o toma determinados medicamentos por va oral para la diabetes. La hipoglucemia es Engineering geologist  potencialmente mortal. Los sntomas de la hipoglucemia (somnolencia, mareos y confusin) son similares a los sntomas de haber consumido demasiado alcohol. Si el mdico afirma que el alcohol es seguro para usted, [place] estas pautas:  Limite el consumo de alcohol a no ms de 76mdida por da si es mujer y no est eVass y a 270midas si es hombre. Una medida equivale a 12oz (35559mde cerveza, 5oz (148m23me vino o 1oz (44ml55m bebidas alcohlicas de alta graduacin.  No beba con el estmago vaco.  Mantngase hidratado bebiendo agua, refrescos dietticos o t helado sin azcar.  Tenga en cuenta que los refrescos comunes, los jugos y otras bebida para  mezclOptician, dispensingen contener mucha azcar y se deben contar como carbohidratos. Cules son algunos consejos para seguir este plan?  Leer las etiquetas de los alimentos  Comience por leer el tamao de la porcin en la "Informacin nutricional" en las etiquetas de los alimentos envasados y las bebidas. La cantidad de caloras, carbohidratos, grasas y otros nutrientes mencionados en la etiqueta se basan en una porcin del alimento. Muchos alimentos contienen ms de una porcin por envase.  Verifique la cantidad total de gramos (g) de carbohidratos totales en una porcin. Puede calcular la cantidad de porciones de carbohidratos al dividir el total de carbohidratos por 15. Por ejemplo, si un alimento tiene un total de 30g de carbohidratos, equivale a 2 porciones de carbohidratos.  Verifique la cantidad de gramos (g) de grasas saturadas y grasas trans en una porcin. Escoja alimentos que no contengan grasa o que tengan un bajo contenido.  Verifique la cantidad de miligramos (mg) de sal (sodio) en una porcin. La mayorState Farmas personas deben limitar la ingesta de sodio total a menos de 2300mg 48mda.  STraining and development officermpre consulte la informacin nutricional de los alimentos etiquetados como "con bajo contenido de grasa" o "sin grasa". Estos alimentos pueden tener un mayor contenido de azcar Location managerada o carbohidratos refinados, y deben evitarse.  Hable con su nutricionista para identificar sus objetivos diarios en cuanto a los nutrientes mencionados en la etiqueta. Al ir de compras  Evite comprar alimentos procesados, enlatados o precocinados. Estos alimentos tienden a tener Special educational needs teacher cantidad de grasa,[place] y azcar agregada.  Compre en la zona exterior de la tienda de comestibles. Esta zona incluye frutas y verduras frescas, granos a granel, carnes frescas y productos lcteos frescos. Al cocinar  Utilice mtodos de coccin a baja temperatura, como hornear, en lugar de mtodos de coccin a alta temperatura, como  frer en abundante aceite.  Cocine con aceites saludables, como el aceite de oliva,Bernla o girasoHalfwayte cocinar con manteca, crema o carnes con alto contenido de grasa. Planificacin de las comidas  Coma las comidas y los refrigerios regularmente, preferentemente a la misma hora todos los daBlancharde pasar largos perodos de tiempo sin comer.  Consuma alimentos ricos en fibra, como frutas frescas, verduras, frijoles y cereales integrales. Consulte a su nutricionista sobre cuntas porciones de carbohidratos puede consumir en cada comida.  Consuma entre 4 y 6 onzas (oz) de protenas magras por da, como carnes magrasNeoshoo, pescado, huevos o tofu. Una onza de protena magra equivale a: ? 1 onza de carne, pollo o pescado. ? 1huevo. ?  taza de tofu.  Coma algunos alimentos por da que contengan grasas saludables, como aguacates, frutos secos, semillas y pescado. Estilo de vida  Controle su nivel de glucemia con regularidad.  Haga actividad fsica habitualmente como se lo haya indicado el mdico.  Esto puede incluir lo siguiente: ? 147mnutos semanales de ejercicio de intensidad moderada o alta. Esto podra incluir caminatas dinmicas, ciclismo o gimnasia acutica. ? Realizar ejercicios de elongacin y de fortalecimiento, como yoga o levantamiento de pesas, por lo menos 2veces por semana.  Tome los mTenneco Incse lo haya indicado el mdico.  No consuma ningn producto que contenga nicotina o tabaco, como cigarrillos y cPsychologist, sport and exercise Si necesita ayuda para dejar de fumar, consulte al mHess Corporationcon un asesor o instructor en diabetes para identificar estrategias para controlar el estrs y cualquier desafo emocional y social. Preguntas para hacerle al mdico  Es necesario que consulte a uRadio broadcast assistanten el cuidado de la diabetes?  Es necesario que me rena con un nutricionista?  A qu nmero puedo llamar si tengo preguntas?  Cules son los mejores  momentos para controlar la glucemia? Dnde encontrar ms informacin:  Asociacin Estadounidense de la Diabetes (American Diabetes Association): diabetes.org  Academia de Nutricin y DInformation systems manager(Academy of Nutrition and Dietetics): www.eatright.oLanaganDiabetes y las Enfermedades Digestivas y Renales ([facility] Medical Centerof Diabetes and Digestive and Kidney Diseases, NIH): wDesMoinesFuneral.dkResumen  Un plan de alimentacin saludable lo ayudar a cAeronautical engineerglucemia y mTheatre managerun estilo de vida saludable.  Trabajar con un especialista en dietas y nutricin (nutricionista) puede ayudarlo a eInsurance claims handlerde alimentacin para usted.  Tenga en cuenta que los carbohidratos (hidratos de carbono) y el alcohol tienen efectos inmediatos en sus niveles de glucemia. Es importante contar los carbohidratos que ingiere y consumir alcohol con prudencia. Esta informacin no tiene cMarine scientistel consejo del mdico. Asegrese de hacerle al mdico cualquier pregunta que tenga. Document Revised: 07/15/2017 Document Reviewed: 02/24/2017 Elsevier Patient Education  2020 Elsevier Inc.      MAgustina Caroli MD [facility]gent [facility]earnedGroup

## 2020-03-15 NOTE — Patient Instructions (Addendum)
   If you have lab work done today you will be contacted with your lab results within the next 2 weeks.  If you have not heard from us then please contact us. The fastest way to get your results is to register for My Chart.   IF you received an x-ray today, you will receive an invoice from [facility]. Please contact [facility] at [phone] with questions or concerns regarding your invoice.   IF you received labwork today, you will receive an invoice from [facility]. Please contact [facility] at [phone] with questions or concerns regarding your invoice.   Our billing staff will not be able to assist you with questions regarding bills from these companies.  You will be contacted with the lab results as soon as they are available. The fastest way to get your results is to activate your My Chart account. Instructions are located on the last page of this paperwork. If you have not heard from us regarding the results in 2 weeks, please contact this office.      Infeccin urinaria en los adultos Urinary Tract Infection, Adult Una infeccin urinaria (IU) puede ocurrir en cualquier lugar de las vas urinarias. Las vas urinarias incluyen lo siguiente:  Los riones.  Los urteres.  La vejiga.  La uretra. Estos rganos fabrican, almacenan y eliminan el pis (orina) del cuerpo. Cules son las causas? La causa es la presencia de grmenes (bacterias) en la zona genital. Estos grmenes proliferan y causan hinchazn (inflamacin) de las vas urinarias. Qu incrementa el riesgo? Es ms probable que contraiga esta afeccin si:  Tiene colocado un tubo delgado y pequeo (catter) para drenar el pis.  Nopuede controlar la evacuacin de pis ni de materia fecal (incontinencia).  Es mujer y, adems: ? Usa estos mtodos para evitar el embarazo:  Un medicamento que mata los espermatozoides (espermicida).  Un dispositivo que impide el paso de los espermatozoides  (diafragma). ? Tieneniveles bajos de una hormona femenina (estrgeno). ? Est embarazada.  Tiene genes que aumentan su riesgo.  Es sexualmente activa.  Toma antibiticos.  Tiene dificultad para orinar debido a: ? Su prstata es ms grande de lo normal, si usted es hombre. ? Obstruccin en la parte del cuerpo que drena el pis de la vejiga (uretra). ? Clculo renal. ? Untrastorno nervioso que afecta la vejiga (vejiga neurgena). ? No bebe una cantidad suficiente de lquido. ? No hace pis con la frecuencia suficiente.  Tiene otras afecciones, como: ? Diabetes. ? Un sistema que combate las enfermedades (sistema inmunitario) debilitado. ? Anemia drepanoctica. ? Gota. ? Lesin en la columna vertebral. Cules son los signos o los sntomas? Los sntomas de esta afeccin incluyen:  Necesidad inmediata (urgente) de hacer pis.  Hacer pis con frecuencia.  Hacer poca cantidad de pis con mucha frecuencia.  Dolor o ardor al hacer pis.  Sangre en el pis.  Pis que huele mal o anormal.  Dificultad para hacer pis.  Pis turbio.  Lquido que sale de la vagina, si es mujer.  Dolor en la barriga o en la parte baja de la espalda. Otros sntomas pueden incluir los siguientes:  Vmitos.  No sentir deseos de comer.  Sentirse confundido (confuso).  Sentirse cansado y malhumorado (irritable).  Fiebre.  Materia fecal lquida (diarrea). Cmo se trata? El tratamiento de esta afeccin puede incluir:  Antibiticos.  Otros medicamentos.  Beber una cantidad suficiente de agua. Sigue estas instrucciones en tu casa:  Medicamentos  Tome los medicamentos de venta libre y   los recetados solamente como se lo haya indicado el mdico.  Si le recetaron un antibitico, tmelo como se lo haya indicado el mdico. No deje de tomarlo aunque comience a sentirse mejor. Indicaciones generales  Asegrese de hacer lo siguiente: ? Haga pis hasta que la vejiga quede vaca. ? Nocontenga el  pis durante mucho tiempo. ? Vace la vejiga despus de Clinical biochemist. ? Lmpiese de adelante hacia atrs despus de defecar, si es mujer. Use cada trozo de papel higinico solo una vez cuando se limpie.  Beba suficiente lquido como para Theatre manager la orina de color amarillo plido.  Concurra a todas las visitas de seguimiento como se lo haya indicado el mdico. Esto es importante. Comunquese con un mdico si:  No mejora despus de 1 o 2das de tratamiento.  Los sntomas desaparecen y Teacher, adult education. Solicite ayuda inmediatamente si:  Tiene un dolor muy intenso en la espalda.  Tiene dolor muy intenso en la parte baja de la barriga.  Tener fiebre.  Tiene Higher education careers adviser (nuseas).  Tiene vmitos. Resumen  Una infeccin urinaria (IU) puede ocurrir en Clinical cytogeneticist de las vas [place].  Esta afeccin es causada por la presencia de grmenes en la zona genital.  Existen muchos factores de riesgo de sufrir una IU. Estos incluyen tener colocado un tubo delgado y pequeo para drenar el pis y no poder controlar cundo hace pis y materia fecal.  El tratamiento incluye antibiticos contra los grmenes.  Beba suficiente lquido como para Theatre manager la orina de color amarillo plido. Esta informacin no tiene Marine scientist el consejo del mdico. Asegrese de hacerle al mdico cualquier pregunta que tenga. Document Revised: 10/28/2018 Document Reviewed: 10/28/2018 Elsevier Patient Education  2020 [employer].  Diabetes mellitus y nutricin, en adultos Diabetes Mellitus and Nutrition, Adult Si sufre de diabetes (diabetes mellitus), es muy importante tener hbitos alimenticios saludables debido a que sus niveles de Designer, television/film set sangre (glucosa) se ven afectados en gran medida por lo que come y bebe. [place] alimentos saludables en las cantidades [place], aproximadamente a la [employer], [place] ayudar a:  Aeronautical engineer glucemia.  Disminuir el riesgo de  sufrir una enfermedad cardaca.  Mejorar la presin arterial.  Science writer o mantener un peso saludable. Todas las personas que sufren de diabetes son diferentes y cada una tiene necesidades diferentes en cuanto a un plan de alimentacin. El mdico puede recomendarle que trabaje con un especialista en dietas y nutricin (nutricionista) para Financial trader plan para usted. Su plan de alimentacin puede variar segn factores como:  Las caloras que necesita.  Los medicamentos que toma.  Su peso.  Sus niveles de glucemia, presin arterial y colesterol.  Su nivel de [place].  Otras afecciones que tenga, como enfermedades cardacas o renales. Cmo me afectan los carbohidratos? Los carbohidratos, o hidratos de carbono, afectan su nivel de glucemia ms que cualquier otro tipo de alimento. La ingesta de carbohidratos naturalmente aumenta la cantidad de [employer]. El recuento de carbohidratos es un mtodo destinado a Catering manager un registro de la cantidad de carbohidratos que se consumen. El recuento de carbohidratos es importante para Theatre manager la glucemia a un nivel saludable, especialmente si utiliza insulina o toma determinados medicamentos por va oral para la diabetes. Es importante conocer la cantidad de carbohidratos que se pueden ingerir en cada comida sin correr Engineer, manufacturing. Esto es Psychologist, forensic. Su nutricionista puede ayudarlo a calcular la cantidad de carbohidratos que debe ingerir en cada  comida y en cada refrigerio. Entre los alimentos que contienen carbohidratos, se incluyen:  Pan, cereal, arroz, pastas y galletas.  Papas y maz.  Guisantes, frijoles y lentejas.  Leche y Estate agent.  Lambert Mody y [place].  Postres, como pasteles, galletas, helado y caramelos. Cmo me afecta el alcohol? El alcohol puede provocar disminuciones sbitas de la glucemia (hipoglucemia), especialmente si utiliza insulina o toma determinados medicamentos por va oral para la diabetes. La  hipoglucemia es una afeccin potencialmente mortal. Los sntomas de la hipoglucemia (somnolencia, mareos y confusin) son similares a los sntomas de haber consumido demasiado alcohol. Si el mdico afirma que el alcohol es seguro para usted, [place] estas pautas:  Limite el consumo de alcohol a no ms de 24medida por da si es mujer y no est [place], y a 33medidas si es hombre. Una medida equivale a 12oz (368ml) de cerveza, 5oz (151ml) de vino o 1oz (47ml) de bebidas alcohlicas de alta graduacin.  No beba con el estmago vaco.  Mantngase hidratado bebiendo agua, refrescos dietticos o t helado sin azcar.  Tenga en cuenta que los refrescos comunes, los jugos y otras bebida para Optician, dispensing pueden contener mucha azcar y se deben contar como carbohidratos. Cules son algunos consejos para seguir este plan?  Leer las etiquetas de los alimentos  Comience por leer el tamao de la porcin en la "Informacin nutricional" en las etiquetas de los alimentos envasados y las bebidas. La cantidad de caloras, carbohidratos, grasas y otros nutrientes mencionados en la etiqueta se basan en una porcin del alimento. Muchos alimentos contienen ms de una porcin por envase.  Verifique la cantidad total de gramos (g) de carbohidratos totales en una porcin. Puede calcular la cantidad de porciones de carbohidratos al dividir el total de carbohidratos por 15. Por ejemplo, si un alimento tiene un total de 30g de carbohidratos, equivale a 2 porciones de carbohidratos.  Verifique la cantidad de gramos (g) de grasas saturadas y grasas trans en una porcin. Escoja alimentos que no contengan grasa o que tengan un bajo contenido.  Verifique la cantidad de miligramos (mg) de sal (sodio) en una porcin. La [employer] de las personas deben limitar la ingesta de sodio total a menos de 2300mg  por Training and development officer.  Siempre consulte la informacin nutricional de los alimentos etiquetados como "con bajo contenido de grasa" o "sin  grasa". Estos alimentos pueden tener un mayor contenido de Location manager agregada o carbohidratos refinados, y deben evitarse.  Hable con su nutricionista para identificar sus objetivos diarios en cuanto a los nutrientes mencionados en la etiqueta. Al ir de compras  Evite comprar alimentos procesados, enlatados o precocinados. Estos alimentos tienden a Special educational needs teacher mayor cantidad de [place], sodio y azcar agregada.  Compre en la zona exterior de la tienda de comestibles. Esta zona incluye frutas y verduras frescas, granos a granel, carnes frescas y productos lcteos frescos. Al cocinar  Utilice mtodos de coccin a baja temperatura, como hornear, en lugar de mtodos de coccin a alta temperatura, como frer en abundante aceite.  Cocine con aceites saludables, como el aceite de [place], canola o [place].  Evite cocinar con manteca, crema o carnes con alto contenido de grasa. Planificacin de las comidas  Coma las comidas y los refrigerios regularmente, preferentemente a la misma hora todos [place]. Evite pasar largos perodos de tiempo sin comer.  Consuma alimentos ricos en fibra, como frutas frescas, verduras, frijoles y cereales integrales. Consulte a su nutricionista sobre cuntas porciones de carbohidratos puede consumir en cada comida.  Consuma entre 4  y 6 onzas (oz) de protenas magras por da, como carnes [place], pollo, pescado, huevos o tofu. Una onza de protena magra equivale a: ? 1 onza de carne, pollo o pescado. ? 1huevo. ?  taza de tofu.  Coma algunos alimentos por da que contengan grasas saludables, como aguacates, frutos secos, semillas y pescado. Estilo de vida  Controle su nivel de glucemia con regularidad.  Haga actividad fsica habitualmente como se lo haya indicado el mdico. Esto puede incluir lo siguiente: ? 15minutos semanales de ejercicio de intensidad moderada o alta. Esto podra incluir caminatas dinmicas, ciclismo o gimnasia acutica. ? Realizar ejercicios de  elongacin y de fortalecimiento, como yoga o levantamiento de pesas, por lo menos 2veces por semana.  Tome los [employer] se lo haya indicado el mdico.  No consuma ningn producto que contenga nicotina o tabaco, como cigarrillos y Psychologist, sport and exercise. Si necesita ayuda para dejar de fumar, consulte al [employer] con un asesor o instructor en diabetes para identificar estrategias para controlar el estrs y cualquier desafo emocional y social. Preguntas para hacerle al mdico  Es necesario que consulte a Radio broadcast assistant en el cuidado de la diabetes?  Es necesario que me rena con un nutricionista?  A qu nmero puedo llamar si tengo preguntas?  Cules son los mejores momentos para controlar la glucemia? Dnde encontrar ms informacin:  Asociacin Estadounidense de la Diabetes (American Diabetes Association): diabetes.org  Academia de Nutricin y Information systems manager (Academy of Nutrition and Dietetics): www.eatright.[place] Diabetes y las Enfermedades Digestivas y [facility] of Diabetes and Digestive and Kidney Diseases, NIH): [employer].dk Resumen  Un plan de alimentacin saludable lo ayudar a Aeronautical engineer glucemia y Theatre manager un estilo de vida saludable.  Trabajar con un especialista en dietas y nutricin (nutricionista) puede ayudarlo a Insurance claims handler de alimentacin para usted.  Tenga en cuenta que los carbohidratos (hidratos de carbono) y el alcohol tienen efectos inmediatos en sus niveles de glucemia. Es importante contar los carbohidratos que ingiere y consumir alcohol con prudencia. Esta informacin no tiene Marine scientist el consejo del mdico. Asegrese de hacerle al mdico cualquier pregunta que tenga. Document Revised: 07/15/2017 Document Reviewed: 02/24/2017 Elsevier Patient Education  [place].

## 2020-03-15 NOTE — Assessment & Plan Note (Signed)
Blood pressure slightly elevated.  Continue present medication.  No changes. Diabetes well controlled with hemoglobin A1c of 6.4.  Metformin creating too many GI side effects.  We will stop it and start Januvia 50 mg daily. Today patient has a UTI.  Will treat with antibiotics. Diet and nutrition discussed. Follow-up in 3 months.

## 2020-03-16 ENCOUNTER — Telehealth: Payer: Self-pay | Admitting: Emergency Medicine

## 2020-03-16 LAB — LIPID PANEL
Chol/HDL Ratio: 2.5 ratio (ref 0.0–4.4)
Cholesterol, Total: 131 mg/dL (ref 100–199)
HDL: 52 mg/dL (ref >39)
LDL Chol Calc (NIH): 59 mg/dL (ref 0–99)
Triglycerides: 111 mg/dL (ref 0–149)
VLDL Cholesterol Cal: 20 mg/dL (ref 5–40)

## 2020-03-16 LAB — CMP14+EGFR
ALT: 27 IU/L (ref 0–32)
AST: 19 IU/L (ref 0–40)
Albumin/Globulin Ratio: 1.8 (ref 1.2–2.2)
Albumin: 4.4 g/dL (ref 3.8–4.9)
Alkaline Phosphatase: 89 IU/L (ref 39–117)
BUN/Creatinine Ratio: 17 (ref 9–23)
BUN: 13 mg/dL (ref 6–24)
Bilirubin Total: 0.4 mg/dL (ref 0.0–1.2)
CO2: 20 mmol/L (ref 20–29)
Calcium: 9.6 mg/dL (ref 8.7–10.2)
Chloride: 105 mmol/L (ref 96–106)
Creatinine, Ser: 0.78 mg/dL (ref 0.57–1.00)
GFR calc Af Amer: 96 mL/min/{1.73_m2} (ref >59)
GFR calc non Af Amer: 83 mL/min/{1.73_m2} (ref >59)
Globulin, Total: 2.5 g/dL (ref 1.5–4.5)
Glucose: 90 mg/dL (ref 65–99)
Potassium: 4.2 mmol/L (ref 3.5–5.2)
Sodium: 140 mmol/L (ref 134–144)
Total Protein: 6.9 g/dL (ref 6.0–8.5)

## 2020-03-16 LAB — HEPATITIS C ANTIBODY: Hep C Virus Ab: <0.1 s/co ratio (ref 0.0–0.9)

## 2020-03-16 LAB — HIV ANTIBODY (ROUTINE TESTING W REFLEX): HIV Screen 4th Generation wRfx: NONREACTIVE

## 2020-03-16 NOTE — Telephone Encounter (Signed)
Called about blood results.  Left a message.

## 2020-03-18 ENCOUNTER — Other Ambulatory Visit: Payer: Self-pay | Admitting: Emergency Medicine

## 2020-03-18 ENCOUNTER — Telehealth: Payer: Self-pay | Admitting: Emergency Medicine

## 2020-03-18 DIAGNOSIS — N390 Urinary tract infection, site not specified: Secondary | ICD-10-CM

## 2020-03-18 LAB — MICROALBUMIN / CREATININE URINE RATIO
Creatinine, Urine: 19.2 mg/dL
Microalb/Creat Ratio: 57 mg/g creat — ABNORMAL HIGH (ref 0–29)
Microalbumin, Urine: 10.9 ug/mL

## 2020-03-18 LAB — URINE CULTURE

## 2020-03-18 MED ORDER — AMOXICILLIN-POT CLAVULANATE 875-125 MG PO TABS: 1.0000 | ORAL_TABLET | Freq: Two times a day (BID) | ORAL | 0 refills | Status: AC

## 2020-03-18 NOTE — Telephone Encounter (Signed)
Call about urine culture results.  No answer.  Left message.  Changed antibiotic to Augmentin twice a day based on sensitivity results

## 2020-03-23 ENCOUNTER — Other Ambulatory Visit: Payer: Self-pay | Admitting: Emergency Medicine

## 2020-03-23 MED ORDER — GLIPIZIDE 5 MG PO TABS: 5.0000 mg | ORAL_TABLET | Freq: Every day | ORAL | 3 refills | Status: DC

## 2020-03-29 ENCOUNTER — Ambulatory Visit
Admission: RE | Admit: 2020-03-29 | Discharge: 2020-03-29 | Disposition: A | Discharge status: Home / Self Care | Payer: BLUE CROSS/BLUE SHIELD | Source: Ambulatory Visit | Attending: Emergency Medicine | Admitting: Emergency Medicine

## 2020-03-29 ENCOUNTER — Other Ambulatory Visit: Payer: Self-pay

## 2020-03-29 DIAGNOSIS — Z1231 Encounter for screening mammogram for malignant neoplasm of breast: Secondary | ICD-10-CM

## 2020-03-31 ENCOUNTER — Other Ambulatory Visit: Payer: Self-pay | Admitting: Emergency Medicine

## 2020-03-31 DIAGNOSIS — R928 Other abnormal and inconclusive findings on diagnostic imaging of breast: Secondary | ICD-10-CM

## 2020-04-10 ENCOUNTER — Other Ambulatory Visit: Payer: Self-pay

## 2020-04-10 ENCOUNTER — Other Ambulatory Visit: Payer: Self-pay | Admitting: Emergency Medicine

## 2020-04-10 ENCOUNTER — Ambulatory Visit
Admission: RE | Admit: 2020-04-10 | Discharge: 2020-04-10 | Disposition: A | Discharge status: Home / Self Care | Payer: 59 | Source: Ambulatory Visit | Attending: Emergency Medicine | Admitting: Emergency Medicine

## 2020-04-10 DIAGNOSIS — R928 Other abnormal and inconclusive findings on diagnostic imaging of breast: Secondary | ICD-10-CM

## 2020-04-10 DIAGNOSIS — N6320 Unspecified lump in the left breast, unspecified quadrant: Secondary | ICD-10-CM

## 2020-06-08 ENCOUNTER — Ambulatory Visit: Payer: 59 | Admitting: Emergency Medicine

## 2020-06-08 ENCOUNTER — Other Ambulatory Visit: Payer: Self-pay

## 2020-06-08 ENCOUNTER — Encounter: Payer: Self-pay | Admitting: Emergency Medicine

## 2020-06-08 VITALS — BP 138/60 | HR 68 | Temp 98.2°F | Ht 61.5 in | Wt 165.6 lb

## 2020-06-08 DIAGNOSIS — E1159 Type 2 diabetes mellitus with other circulatory complications: Secondary | ICD-10-CM | POA: Diagnosis not present

## 2020-06-08 DIAGNOSIS — I10 Essential (primary) hypertension: Secondary | ICD-10-CM

## 2020-06-08 DIAGNOSIS — I152 Hypertension secondary to endocrine disorders: Secondary | ICD-10-CM

## 2020-06-08 DIAGNOSIS — G8929 Other chronic pain: Secondary | ICD-10-CM

## 2020-06-08 DIAGNOSIS — E1169 Type 2 diabetes mellitus with other specified complication: Secondary | ICD-10-CM

## 2020-06-08 DIAGNOSIS — M25561 Pain in right knee: Secondary | ICD-10-CM

## 2020-06-08 DIAGNOSIS — E785 Hyperlipidemia, unspecified: Secondary | ICD-10-CM

## 2020-06-08 DIAGNOSIS — Z6830 Body mass index (BMI) 30.0-30.9, adult: Secondary | ICD-10-CM

## 2020-06-08 LAB — GLUCOSE, POCT (MANUAL RESULT ENTRY): POC Glucose: 99 mg/dl (ref 70–99)

## 2020-06-08 LAB — POCT GLYCOSYLATED HEMOGLOBIN (HGB A1C): Hemoglobin A1C: 6.4 % — AB (ref 4.0–5.6)

## 2020-06-08 MED ORDER — GLIPIZIDE 5 MG PO TABS: 5.0000 mg | ORAL_TABLET | Freq: Every day | ORAL | 3 refills | Status: DC

## 2020-06-08 MED ORDER — AMLODIPINE BESYLATE 5 MG PO TABS: 5.0000 mg | ORAL_TABLET | Freq: Every day | ORAL | 3 refills | Status: DC

## 2020-06-08 MED ORDER — ATORVASTATIN CALCIUM 20 MG PO TABS: 20.0000 mg | ORAL_TABLET | Freq: Every day | ORAL | 3 refills | Status: DC

## 2020-06-08 NOTE — Progress Notes (Signed)
[name] 59 y.o.   Chief Complaint  Patient presents with  . Follow-up    x3 mos Hypertension/diabetes   ASSESSMENT & PLAN: Hypertension associated with diabetes ([place]) Blood pressure slightly elevated.  Continue present medication.  No changes. Diabetes well controlled with hemoglobin A1c of 6.4.  Metformin creating too many GI side effects.  We will stop it and start Januvia 50 mg daily. Today patient has a UTI.  Will treat with antibiotics. Diet and nutrition discussed. Follow-up in 3 months. HISTORY OF PRESENT ILLNESS: This is a 59 y.o. female here for 38-month follow-up on diabetes and hypertension. Increased overall stress due to son's recent incarceration. Hypertension on Norvasc 5 mg daily. Diabetes but presently not on any medication.  Intolerant to Metformin.  Did not start Januvia due to high cost.  Glipizide was prescribed instead but never dispensed for some unknown reason.  She has been off diabetes medication for the past 3 months. No other complaints or medical concerns today.  HPI   Prior to Admission medications   Medication Sig Start Date End Date Taking? Authorizing Provider  amLODipine (NORVASC) 5 MG tablet Take 1 tablet (5 mg total) by mouth daily. 03/15/20  Yes Darrly Loberg, Ines Bloomer, MD  atorvastatin (LIPITOR) 20 MG tablet Take 1 tablet (20 mg total) by mouth daily. 03/15/20  Yes Elida Harbin, Ines Bloomer, MD  naproxen (NAPROSYN) 500 MG tablet Take 1 tablet (500 mg total) by mouth 2 (two) times daily. Patient not taking: Reported on 03/15/2020 05/25/19   Wieters, Madelynn Done C, PA-C    No Known Allergies  Patient Active Problem List   Diagnosis Date Noted  . Hypertension associated with diabetes ([place]) 03/15/2020  . Dyslipidemia associated with type 2 diabetes mellitus ([place]) 03/15/2020    Past Medical History:  Diagnosis Date  . Hepatic steatosis   . HLD (hyperlipidemia)   . Hypertension   . Internal hemorrhoids     Past Surgical History:  Procedure  Laterality Date  . NO PAST SURGERIES      Social History   Socioeconomic History  . Marital status: Single    Spouse name: Not on file  . Number of children: 6  . Years of education: Not on file  . Highest education level: Not on file  Occupational History  . Not on file  Tobacco Use  . Smoking status: Never Smoker  . Smokeless tobacco: Never Used  Substance and Sexual Activity  . Alcohol use: No    Alcohol/week: 0.0 standard drinks  . Drug use: No  . Sexual activity: Never  Other Topics Concern  . Not on file  Social History Narrative   ** Merged History Encounter **       Social Determinants of Health   Financial Resource Strain:   . Difficulty of Paying Living Expenses:   Food Insecurity:   . Worried About Charity fundraiser in the Last Year:   . Arboriculturist in the Last Year:   Transportation Needs:   . Film/video editor (Medical):   Marland Kitchen Lack of Transportation (Non-Medical):   Physical Activity:   . Days of Exercise per Week:   . Minutes of Exercise per Session:   Stress:   . Feeling of Stress :   Social Connections:   . Frequency of Communication with Friends and Family:   . Frequency of Social Gatherings with Friends and Family:   . Attends Religious Services:   . Active Member of Clubs or Organizations:   .  Attends Archivist Meetings:   Marland Kitchen Marital Status:   Intimate Partner Violence:   . Fear of Current or Ex-Partner:   . Emotionally Abused:   Marland Kitchen Physically Abused:   . Sexually Abused:     Family History  Problem Relation Age of Onset  . Hypertension Mother   . Alcohol abuse Father   . Heart disease Father      Review of Systems  Constitutional: Negative.  Negative for chills and fever.  HENT: Negative.  Negative for congestion and sore throat.   Respiratory: Negative.  Negative for cough and shortness of breath.   Cardiovascular: Negative.  Negative for chest pain and palpitations.  Gastrointestinal: Negative.  Negative for  abdominal pain, blood in stool, diarrhea, melena, nausea and vomiting.  Genitourinary: Negative.  Negative for dysuria and hematuria.  Musculoskeletal: Negative.  Negative for back pain, myalgias and neck pain.  Skin: Negative.  Negative for rash.  Neurological: Negative.  Negative for dizziness and headaches.  All other systems reviewed and are negative.  Today's Vitals   06/08/20 1522  BP: (!) 138/60  Pulse: 68  Temp: 98.2 F (36.8 C)  TempSrc: Temporal  SpO2: 97%  Weight: 165 lb 9.6 oz (75.1 kg)  Height: 5' 1.5" (1.562 m)   Body mass index is 30.78 kg/m.   Physical Exam Vitals reviewed.  Constitutional:      Appearance: Normal appearance.  HENT:     Head: Normocephalic.  Eyes:     Extraocular Movements: Extraocular movements intact.     Pupils: Pupils are equal, round, and reactive to light.  Cardiovascular:     Rate and Rhythm: Normal rate and regular rhythm.     Pulses: Normal pulses.     Heart sounds: Normal heart sounds.  Pulmonary:     Effort: Pulmonary effort is normal.     Breath sounds: Normal breath sounds.  Musculoskeletal:        General: Normal range of motion.     Cervical back: Normal range of motion and neck supple.     Comments: Right knee: Positive crepitation, full range of motion, no significant tenderness or swelling  Skin:    General: Skin is warm and dry.     Capillary Refill: Capillary refill takes less than 2 seconds.  Neurological:     General: No focal deficit present.     Mental Status: She is alert and oriented to person, place, and time.  Psychiatric:        Mood and Affect: Mood normal.        Behavior: Behavior normal.     Results for orders placed or performed in visit on 06/08/20 (from the past 24 hour(s))  POCT glucose (manual entry)     Status: None   Collection Time: 06/08/20  3:35 PM  Result Value Ref Range   POC Glucose 99 70 - 99 mg/dl  POCT glycosylated hemoglobin (Hb A1C)     Status: Abnormal   Collection Time:  06/08/20  3:42 PM  Result Value Ref Range   Hemoglobin A1C 6.4 (A) 4.0 - 5.6 %   HbA1c POC (<> result, manual entry)     HbA1c, POC (prediabetic range)     HbA1c, POC (controlled diabetic range)      A total of 30 minutes was spent with the patient, greater than 50% of which was in counseling/coordination of care regarding diabetes and hypertension and cardiovascular risks associated with these conditions, review of all medications, hypoglycemia precautions, review  of most recent office visit notes, review of most recent blood work results, diet and nutrition, prognosis, need for follow-up.   ASSESSMENT & PLAN: Hypertension associated with diabetes ([place]) Well-controlled hypertension.  Continue present medications.  No changes. Hemoglobin A1c is 6.4.  Intolerant to Metformin.  Will start glipizide 5 mg daily.  Diet and nutrition discussed.  Continue statin therapy.   Follow-up in 3 months.   Cianni was seen today for follow-up.  Diagnoses and all orders for this visit:  Hypertension associated with diabetes ([place]) -     Ambulatory referral to Ophthalmology -     amLODipine (NORVASC) 5 MG tablet; Take 1 tablet (5 mg total) by mouth daily.  Dyslipidemia associated with type 2 diabetes mellitus ([facility]) -     atorvastatin (LIPITOR) 20 MG tablet; Take 1 tablet (20 mg total) by mouth daily.  Type 2 diabetes mellitus with other specified complication, without long-term current use of insulin ([facility]) -     POCT glycosylated hemoglobin (Hb A1C) -     POCT glucose (manual entry) -     Discontinue: glipiZIDE (GLUCOTROL) 5 MG tablet; Take 1 tablet (5 mg total) by mouth daily with breakfast. -     glipiZIDE (GLUCOTROL) 5 MG tablet; Take 1 tablet (5 mg total) by mouth daily with breakfast.  Body mass index (BMI) of 30.0-30.9 in adult  Chronic pain of right knee -     Ambulatory referral to Orthopedic Surgery    Patient Instructions       If you have lab work done today you will be  contacted with your lab results within the next 2 weeks.  If you have not heard from [place] then please contact us. The fastest way to get your results is to register for My Chart.   IF you received an x-ray today, you will receive an invoice from [facility]. Please contact [facility] at [phone] with questions or concerns regarding your invoice.   IF you received labwork today, you will receive an invoice from [facility]. Please contact [facility] at [phone] with questions or concerns regarding your invoice.   Our billing staff will not be able to assist you with questions regarding bills from these companies.  You will be contacted with the lab results as soon as they are available. The fastest way to get your results is to activate your My Chart account. Instructions are located on the last page of this paperwork. If you have not heard from [place] regarding the results in 2 weeks, please contact this office.     Diabetes mellitus y nutricin, en adultos Diabetes Mellitus and Nutrition, Adult Si sufre de diabetes (diabetes mellitus), es muy importante tener hbitos alimenticios saludables debido a que sus niveles de Designer, television/film set sangre (glucosa) se ven afectados en gran medida por lo que come y bebe. [place] alimentos saludables en las cantidades [place], aproximadamente a la [employer], [place] ayudar a:  Aeronautical engineer glucemia.  Disminuir el riesgo de sufrir una enfermedad cardaca.  Mejorar la presin arterial.  Science writer o mantener un peso saludable. Todas las personas que sufren de diabetes son diferentes y cada una tiene necesidades diferentes en cuanto a un plan de alimentacin. El mdico puede recomendarle que trabaje con un especialista en dietas y nutricin (nutricionista) para Financial trader plan para usted. Su plan de alimentacin puede variar segn factores como:  Las caloras que necesita.  Los medicamentos que toma.  Su peso.  Sus niveles  de glucemia, presin arterial y colesterol.  Su nivel de [place].  Otras afecciones que tenga, como enfermedades cardacas o renales. Cmo me afectan los carbohidratos? Los carbohidratos, o hidratos de carbono, afectan su nivel de glucemia ms que cualquier otro tipo de alimento. La ingesta de carbohidratos naturalmente aumenta la cantidad de [employer]. El recuento de carbohidratos es un mtodo destinado a Catering manager un registro de la cantidad de carbohidratos que se consumen. El recuento de carbohidratos es importante para Theatre manager la glucemia a un nivel saludable, especialmente si utiliza insulina o toma determinados medicamentos por va oral para la diabetes. Es importante conocer la cantidad de carbohidratos que se pueden ingerir en cada comida sin correr Engineer, manufacturing. Esto es Psychologist, forensic. Su nutricionista puede ayudarlo a calcular la cantidad de carbohidratos que debe ingerir en cada comida y en cada refrigerio. Entre los alimentos que contienen carbohidratos, se incluyen:  Pan, cereal, arroz, pastas y galletas.  Papas y maz.  Guisantes, frijoles y lentejas.  Leche y Estate agent.  Lambert Mody y [place].  Postres, como pasteles, galletas, helado y caramelos. Cmo me afecta el alcohol? El alcohol puede provocar disminuciones sbitas de la glucemia (hipoglucemia), especialmente si utiliza insulina o toma determinados medicamentos por va oral para la diabetes. La hipoglucemia es una afeccin potencialmente mortal. Los sntomas de la hipoglucemia (somnolencia, mareos y confusin) son similares a los sntomas de haber consumido demasiado alcohol. Si el mdico afirma que el alcohol es seguro para usted, [place] estas pautas:  Limite el consumo de alcohol a no ms de 58medida por da si es mujer y no est [place], y a 81medidas si es hombre. Una medida equivale a 12oz (326ml) de cerveza, 5oz (158ml) de vino o 1oz (46ml) de bebidas alcohlicas de alta graduacin.  No beba  con el estmago vaco.  Mantngase hidratado bebiendo agua, refrescos dietticos o t helado sin azcar.  Tenga en cuenta que los refrescos comunes, los jugos y otras bebida para Optician, dispensing pueden contener mucha azcar y se deben contar como carbohidratos. Cules son algunos consejos para seguir este plan?  Leer las etiquetas de los alimentos  Comience por leer el tamao de la porcin en la "Informacin nutricional" en las etiquetas de los alimentos envasados y las bebidas. La cantidad de caloras, carbohidratos, grasas y otros nutrientes mencionados en la etiqueta se basan en una porcin del alimento. Muchos alimentos contienen ms de una porcin por envase.  Verifique la cantidad total de gramos (g) de carbohidratos totales en una porcin. Puede calcular la cantidad de porciones de carbohidratos al dividir el total de carbohidratos por 15. Por ejemplo, si un alimento tiene un total de 30g de carbohidratos, equivale a 2 porciones de carbohidratos.  Verifique la cantidad de gramos (g) de grasas saturadas y grasas trans en una porcin. Escoja alimentos que no contengan grasa o que tengan un bajo contenido.  Verifique la cantidad de miligramos (mg) de sal (sodio) en una porcin. [place] [employer] de las personas deben limitar la ingesta de sodio total a menos de 2300mg  por Training and development officer.  Siempre consulte la informacin nutricional de los alimentos etiquetados como "con bajo contenido de grasa" o "sin grasa". Estos alimentos pueden tener un mayor contenido de Location manager agregada o carbohidratos refinados, y deben evitarse.  Hable con su nutricionista para identificar sus objetivos diarios en cuanto a los nutrientes mencionados en la etiqueta. Al ir de compras  Evite comprar alimentos procesados, enlatados o precocinados. Estos alimentos tienden a Special educational needs teacher mayor cantidad de [place],  sodio y Holiday representative.  Compre en la zona exterior de la tienda de comestibles. Esta zona incluye frutas y verduras frescas, granos a  granel, carnes frescas y productos lcteos frescos. Al cocinar  Utilice mtodos de coccin a baja temperatura, como hornear, en lugar de mtodos de coccin a alta temperatura, como frer en abundante aceite.  Cocine con aceites saludables, como el aceite de [place], canola o [place].  Evite cocinar con manteca, crema o carnes con alto contenido de grasa. Planificacin de las comidas  Coma las comidas y los refrigerios regularmente, preferentemente a la misma hora todos [place]. Evite pasar largos perodos de tiempo sin comer.  Consuma alimentos ricos en fibra, como frutas frescas, verduras, frijoles y cereales integrales. Consulte a su nutricionista sobre cuntas porciones de carbohidratos puede consumir en cada comida.  Consuma entre 4 y 6 onzas (oz) de protenas magras por da, como carnes [place], pollo, pescado, huevos o tofu. Una onza de protena magra equivale a: ? 1 onza de carne, pollo o pescado. ? 1huevo. ?  taza de tofu.  Coma algunos alimentos por da que contengan grasas saludables, como aguacates, frutos secos, semillas y pescado. Estilo de vida  Controle su nivel de glucemia con regularidad.  Haga actividad fsica habitualmente como se lo haya indicado el mdico. Esto puede incluir lo siguiente: ? 112minutos semanales de ejercicio de intensidad moderada o alta. Esto podra incluir caminatas dinmicas, ciclismo o gimnasia acutica. ? Realizar ejercicios de elongacin y de fortalecimiento, como yoga o levantamiento de pesas, por lo menos 2veces por semana.  Tome los [employer] se lo haya indicado el mdico.  No consuma ningn producto que contenga nicotina o tabaco, como cigarrillos y Psychologist, sport and exercise. Si necesita ayuda para dejar de fumar, consulte al [employer] con un asesor o instructor en diabetes para identificar estrategias para controlar el estrs y cualquier desafo emocional y social. Preguntas para hacerle al mdico  Es necesario que  consulte a Radio broadcast assistant en el cuidado de la diabetes?  Es necesario que me rena con un nutricionista?  A qu nmero puedo llamar si tengo preguntas?  Cules son los mejores momentos para controlar la glucemia? Dnde encontrar ms informacin:  Asociacin Estadounidense de la Diabetes (American Diabetes Association): diabetes.org  Academia de Nutricin y Information systems manager (Academy of Nutrition and Dietetics): www.eatright.[place] Diabetes y las Enfermedades Digestivas y [facility] of Diabetes and Digestive and Kidney Diseases, NIH): [employer]eral.dk Resumen  Un plan de alimentacin saludable lo ayudar a Aeronautical engineer glucemia y Theatre manager un estilo de vida saludable.  Trabajar con un especialista en dietas y nutricin (nutricionista) puede ayudarlo a Insurance claims handler de alimentacin para usted.  Tenga en cuenta que los carbohidratos (hidratos de carbono) y el alcohol tienen efectos inmediatos en sus niveles de glucemia. Es importante contar los carbohidratos que ingiere y consumir alcohol con prudencia. Esta informacin no tiene Marine scientist el consejo del mdico. Asegrese de hacerle al mdico cualquier pregunta que tenga. Document Revised: 07/15/2017 Document Reviewed: 02/24/2017 Elsevier Patient Education  2020 Elsevier Inc.      Agustina Caroli, MD [facility]

## 2020-06-08 NOTE — Patient Instructions (Addendum)
   If you have lab work done today you will be contacted with your lab results within the next 2 weeks.  If you have not heard from us then please contact us. The fastest way to get your results is to register for My Chart.   IF you received an x-ray today, you will receive an invoice from [facility]. Please contact [facility] at [phone] with questions or concerns regarding your invoice.   IF you received labwork today, you will receive an invoice from [facility]. Please contact [facility] at [phone] with questions or concerns regarding your invoice.   Our billing staff will not be able to assist you with questions regarding bills from these companies.  You will be contacted with the lab results as soon as they are available. The fastest way to get your results is to activate your My Chart account. Instructions are located on the last page of this paperwork. If you have not heard from us regarding the results in 2 weeks, please contact this office.     Diabetes mellitus y nutricin, en adultos Diabetes Mellitus and Nutrition, Adult Si sufre de diabetes (diabetes mellitus), es muy importante tener hbitos alimenticios saludables debido a que sus niveles de azcar en la sangre (glucosa) se ven afectados en gran medida por lo que come y bebe. Comer alimentos saludables en las cantidades adecuadas, aproximadamente a la misma hora todos los das, lo ayudar a:  Controlar la glucemia.  Disminuir el riesgo de sufrir una enfermedad cardaca.  Mejorar la presin arterial.  Alcanzar o mantener un peso saludable. Todas las personas que sufren de diabetes son diferentes y cada una tiene necesidades diferentes en cuanto a un plan de alimentacin. El mdico puede recomendarle que trabaje con un especialista en dietas y nutricin (nutricionista) para elaborar el mejor plan para usted. Su plan de alimentacin puede variar segn factores como:  Las caloras que  necesita.  Los medicamentos que toma.  Su peso.  Sus niveles de glucemia, presin arterial y colesterol.  Su nivel de actividad.  Otras afecciones que tenga, como enfermedades cardacas o renales. Cmo me afectan los carbohidratos? Los carbohidratos, o hidratos de carbono, afectan su nivel de glucemia ms que cualquier otro tipo de alimento. La ingesta de carbohidratos naturalmente aumenta la cantidad de glucosa en la sangre. El recuento de carbohidratos es un mtodo destinado a llevar un registro de la cantidad de carbohidratos que se consumen. El recuento de carbohidratos es importante para mantener la glucemia a un nivel saludable, especialmente si utiliza insulina o toma determinados medicamentos por va oral para la diabetes. Es importante conocer la cantidad de carbohidratos que se pueden ingerir en cada comida sin correr ningn riesgo. Esto es diferente en cada persona. Su nutricionista puede ayudarlo a calcular la cantidad de carbohidratos que debe ingerir en cada comida y en cada refrigerio. Entre los alimentos que contienen carbohidratos, se incluyen:  Pan, cereal, arroz, pastas y galletas.  Papas y maz.  Guisantes, frijoles y lentejas.  Leche y yogur.  Frutas y jugo.  Postres, como pasteles, galletas, helado y caramelos. Cmo me afecta el alcohol? El alcohol puede provocar disminuciones sbitas de la glucemia (hipoglucemia), especialmente si utiliza insulina o toma determinados medicamentos por va oral para la diabetes. La hipoglucemia es una afeccin potencialmente mortal. Los sntomas de la hipoglucemia (somnolencia, mareos y confusin) son similares a los sntomas de haber consumido demasiado alcohol. Si el mdico afirma que el alcohol es seguro para usted, siga estas pautas:    Limite el consumo de alcohol a no ms de 1medida por da si es mujer y no est embarazada, y a 2medidas si es hombre. Una medida equivale a 12oz (355ml) de cerveza, 5oz (148ml) de vino o  1oz (44ml) de bebidas alcohlicas de alta graduacin.  No beba con el estmago vaco.  Mantngase hidratado bebiendo agua, refrescos dietticos o t helado sin azcar.  Tenga en cuenta que los refrescos comunes, los jugos y otras bebida para mezclar pueden contener mucha azcar y se deben contar como carbohidratos. Cules son algunos consejos para seguir este plan?  Leer las etiquetas de los alimentos  Comience por leer el tamao de la porcin en la "Informacin nutricional" en las etiquetas de los alimentos envasados y las bebidas. La cantidad de caloras, carbohidratos, grasas y otros nutrientes mencionados en la etiqueta se basan en una porcin del alimento. Muchos alimentos contienen ms de una porcin por envase.  Verifique la cantidad total de gramos (g) de carbohidratos totales en una porcin. Puede calcular la cantidad de porciones de carbohidratos al dividir el total de carbohidratos por 15. Por ejemplo, si un alimento tiene un total de 30g de carbohidratos, equivale a 2 porciones de carbohidratos.  Verifique la cantidad de gramos (g) de grasas saturadas y grasas trans en una porcin. Escoja alimentos que no contengan grasa o que tengan un bajo contenido.  Verifique la cantidad de miligramos (mg) de sal (sodio) en una porcin. La mayora de las personas deben limitar la ingesta de sodio total a menos de 2300mg por da.  Siempre consulte la informacin nutricional de los alimentos etiquetados como "con bajo contenido de grasa" o "sin grasa". Estos alimentos pueden tener un mayor contenido de azcar agregada o carbohidratos refinados, y deben evitarse.  Hable con su nutricionista para identificar sus objetivos diarios en cuanto a los nutrientes mencionados en la etiqueta. Al ir de compras  Evite comprar alimentos procesados, enlatados o precocinados. Estos alimentos tienden a tener una mayor cantidad de grasa, sodio y azcar agregada.  Compre en la zona exterior de la tienda de  comestibles. Esta zona incluye frutas y verduras frescas, granos a granel, carnes frescas y productos lcteos frescos. Al cocinar  Utilice mtodos de coccin a baja temperatura, como hornear, en lugar de mtodos de coccin a alta temperatura, como frer en abundante aceite.  Cocine con aceites saludables, como el aceite de oliva, canola o girasol.  Evite cocinar con manteca, crema o carnes con alto contenido de grasa. Planificacin de las comidas  Coma las comidas y los refrigerios regularmente, preferentemente a la misma hora todos los das. Evite pasar largos perodos de tiempo sin comer.  Consuma alimentos ricos en fibra, como frutas frescas, verduras, frijoles y cereales integrales. Consulte a su nutricionista sobre cuntas porciones de carbohidratos puede consumir en cada comida.  Consuma entre 4 y 6 onzas (oz) de protenas magras por da, como carnes magras, pollo, pescado, huevos o tofu. Una onza de protena magra equivale a: ? 1 onza de carne, pollo o pescado. ? 1huevo. ?  taza de tofu.  Coma algunos alimentos por da que contengan grasas saludables, como aguacates, frutos secos, semillas y pescado. Estilo de vida  Controle su nivel de glucemia con regularidad.  Haga actividad fsica habitualmente como se lo haya indicado el mdico. Esto puede incluir lo siguiente: ? 150minutos semanales de ejercicio de intensidad moderada o alta. Esto podra incluir caminatas dinmicas, ciclismo o gimnasia acutica. ? Realizar ejercicios de elongacin y de fortalecimiento, como yoga o levantamiento   de pesas, por lo menos 2veces por semana.  Tome los medicamentos como se lo haya indicado el mdico.  No consuma ningn producto que contenga nicotina o tabaco, como cigarrillos y cigarrillos electrnicos. Si necesita ayuda para dejar de fumar, consulte al mdico.  Trabaje con un asesor o instructor en diabetes para identificar estrategias para controlar el estrs y cualquier desafo emocional  y social. Preguntas para hacerle al mdico  Es necesario que consulte a un instructor en el cuidado de la diabetes?  Es necesario que me rena con un nutricionista?  A qu nmero puedo llamar si tengo preguntas?  Cules son los mejores momentos para controlar la glucemia? Dnde encontrar ms informacin:  Asociacin Estadounidense de la Diabetes (American Diabetes Association): diabetes.org  Academia de Nutricin y Diettica (Academy of Nutrition and Dietetics): www.eatright.org  Instituto Nacional de la Diabetes y las Enfermedades Digestivas y Renales (National Institute of Diabetes and Digestive and Kidney Diseases, NIH): www.niddk.nih.gov Resumen  Un plan de alimentacin saludable lo ayudar a controlar la glucemia y mantener un estilo de vida saludable.  Trabajar con un especialista en dietas y nutricin (nutricionista) puede ayudarlo a elaborar el mejor plan de alimentacin para usted.  Tenga en cuenta que los carbohidratos (hidratos de carbono) y el alcohol tienen efectos inmediatos en sus niveles de glucemia. Es importante contar los carbohidratos que ingiere y consumir alcohol con prudencia. Esta informacin no tiene como fin reemplazar el consejo del mdico. Asegrese de hacerle al mdico cualquier pregunta que tenga. Document Revised: 07/15/2017 Document Reviewed: 02/24/2017 Elsevier Patient Education  2020 Elsevier Inc.  

## 2020-06-08 NOTE — Assessment & Plan Note (Addendum)
Well-controlled hypertension.  Continue present medications.  No changes. Hemoglobin A1c is 6.4.  Intolerant to Metformin.  Will start glipizide 5 mg daily.  Diet and nutrition discussed.  Continue statin therapy.   Follow-up in 3 months.

## 2020-06-21 ENCOUNTER — Ambulatory Visit (INDEPENDENT_AMBULATORY_CARE_PROVIDER_SITE_OTHER): Payer: 59

## 2020-06-21 ENCOUNTER — Other Ambulatory Visit: Payer: Self-pay

## 2020-06-21 ENCOUNTER — Ambulatory Visit: Payer: 59 | Admitting: Family Medicine

## 2020-06-21 ENCOUNTER — Encounter: Payer: Self-pay | Admitting: Family Medicine

## 2020-06-21 DIAGNOSIS — M25561 Pain in right knee: Secondary | ICD-10-CM | POA: Diagnosis not present

## 2020-06-21 DIAGNOSIS — G8929 Other chronic pain: Secondary | ICD-10-CM

## 2020-06-21 DIAGNOSIS — M1711 Unilateral primary osteoarthritis, right knee: Secondary | ICD-10-CM | POA: Diagnosis not present

## 2020-06-21 MED ORDER — GLUCOSAMINE SULFATE 1000 MG PO CAPS
1.0000 | ORAL_CAPSULE | Freq: Two times a day (BID) | ORAL | 3 refills | Status: DC
Start: 2020-06-21 — End: 2022-04-22

## 2020-06-21 MED ORDER — VITAMIN K2 100 MCG PO TABS
100.0000 ug | ORAL_TABLET | Freq: Every day | ORAL | 3 refills | Status: DC
Start: 2020-06-21 — End: 2022-04-22

## 2020-06-21 MED ORDER — VITAMIN D-3 125 MCG (5000 UT) PO TABS
1.0000 | ORAL_TABLET | Freq: Every day | ORAL | 3 refills | Status: DC
Start: 2020-06-21 — End: 2022-04-22

## 2020-06-21 MED ORDER — NABUMETONE 500 MG PO TABS: 500.0000 mg | ORAL_TABLET | Freq: Two times a day (BID) | ORAL | 3 refills | Status: DC | PRN

## 2020-06-21 NOTE — Progress Notes (Signed)
Office Visit Note   Patient: Dawn Espinoza           Date of Birth: [birth year]           MRN: [id number] Visit Date: 06/21/2020 Requested by: Horald Pollen, MD [place],  [place] [address] PCP: Horald Pollen, MD  Subjective: Chief Complaint  Patient presents with  . Right Knee - Pain    Pain x 1 year. NKI. H/o surgery on left knee 2 years ago. No swelling. Popping in the knee. Does not give way. No locking. Hurts a lot while working - stands for 9 hr/day.    HPI: She is here with right knee pain.  Interpreter was present today.  Symptoms started 1 year ago, no injury.  Pain deep inside her knee when walking.  It pops intermittently, does not give way or lock.  It hurts when she stands for long time at work.  She has tried some over-the-counter creams with minimal improvement.  She has not taken any prescriptions for this.  She is status post left knee replacement and is doing well from that standpoint.               ROS:   All other systems were reviewed and are negative.  Objective: Vital Signs: LMP 05/25/2019   Physical Exam:  General:  Alert and oriented, in no acute distress. Pulm:  Breathing unlabored. Psy:  Normal mood, congruent affect. Skin: No erythema Right knee: No effusion today.  2+ patellofemoral crepitus with negative patella compression test.  Lachman's is solid.  She has 1+ laxity with valgus stress but still a solid endpoint.  She is moderately tender on the medial joint line, no palpable click with McMurray's.  Imaging: XR Knee 1-2 Views Right  Result Date: 06/21/2020 X-rays of the right knee reveal moderate medial compartment DJD with joint space narrowing and periarticular spurring.  Mild lateral compartment degenerative change.  Mild to moderate patellofemoral spurring.  No signs of loose body, no stress fracture.  No sign of neoplasm.   Assessment & Plan: 1.  Right knee pain, suspect due to DJD.  Cannot rule out degenerative  meniscus tear. -Elected to try Relafen, glucosamine, physical therapy.  If symptoms persist, could contemplate injections or possibly medial compartment unloading brace.  If she fails conservative management, then knee replacement.     Procedures: No procedures performed  No notes on file     PMFS History: Patient Active Problem List   Diagnosis Date Noted  . Hypertension associated with diabetes ([place]) 03/15/2020  . Type 2 diabetes mellitus with hyperglycemia, without long-term current use of insulin ([place]) 03/15/2020  . Elevated liver enzymes 09/01/2019  . Heartburn 09/01/2019  . Hx of colonic polyps 09/01/2019  . Illiterate 09/01/2019  . Influenza vaccination declined 09/01/2019  . RUQ abdominal pain 09/01/2019  . Arthritis of knee 07/14/2019  . Encounter for medical examination to establish care 07/14/2019  . Fatty liver 07/14/2019  . History of total left knee replacement 07/14/2019  . Mixed hyperlipidemia 07/14/2019  . Obesity (BMI 30-39.9) 07/14/2019   Past Medical History:  Diagnosis Date  . Hepatic steatosis   . HLD (hyperlipidemia)   . Hypertension   . Internal hemorrhoids     Family History  Problem Relation Age of Onset  . Hypertension Mother   . Alcohol abuse Father   . Heart disease Father     Past Surgical History:  Procedure Laterality Date  . NO PAST SURGERIES  Social History   Occupational History  . Not on file  Tobacco Use  . Smoking status: Never Smoker  . Smokeless tobacco: Never Used  Substance and Sexual Activity  . Alcohol use: No    Alcohol/week: 0.0 standard drinks  . Drug use: No  . Sexual activity: Never

## 2020-07-17 ENCOUNTER — Other Ambulatory Visit: Payer: Self-pay

## 2020-07-17 ENCOUNTER — Encounter: Payer: Self-pay | Admitting: Physical Therapy

## 2020-07-17 ENCOUNTER — Ambulatory Visit: Payer: 59 | Admitting: Physical Therapy

## 2020-07-17 DIAGNOSIS — M25561 Pain in right knee: Secondary | ICD-10-CM

## 2020-07-17 DIAGNOSIS — R2689 Other abnormalities of gait and mobility: Secondary | ICD-10-CM | POA: Diagnosis not present

## 2020-07-17 DIAGNOSIS — G8929 Other chronic pain: Secondary | ICD-10-CM

## 2020-07-17 DIAGNOSIS — R600 Localized edema: Secondary | ICD-10-CM | POA: Diagnosis not present

## 2020-07-17 DIAGNOSIS — M6281 Muscle weakness (generalized): Secondary | ICD-10-CM

## 2020-07-17 NOTE — Therapy (Signed)
[facility] [address] [place], [place], [address] Phone: [phone]   Fax:  [phone]  Physical Therapy Evaluation  Patient Details  Name: Dawn Espinoza MRN: [id number] Date of Birth: [birth year] Referring Provider (PT): Eunice Blase, MD   Encounter Date: 07/17/2020   PT End of Session - 07/17/20 1643    Visit Number 1    Number of Visits 12    Date for PT Re-Evaluation 08/28/20    Authorization Type [employer]    PT Start Time 1600    PT Stop Time 1640    PT Time Calculation (min) 40 min    Activity Tolerance Patient tolerated treatment well    Behavior During Therapy [facility] for tasks assessed/performed           Past Medical History:  Diagnosis Date  . Hepatic steatosis   . HLD (hyperlipidemia)   . Hypertension   . Internal hemorrhoids     Past Surgical History:  Procedure Laterality Date  . NO PAST SURGERIES      There were no vitals filed for this visit.    Subjective Assessment - 07/17/20 1601    Subjective Pain x 1 year. NKI. H/o surgery of Lt TKA 2 years ago. No swelling. Popping in the knee. Does not give way. No locking. Hurts a lot while working - stands for 9 hr/day and hurts with walking a lot    Pertinent History PMH: LT TKA,HTN, DM, Rt knee OA,obesity    Limitations Walking;Standing    Diagnostic tests Rt knee XR 06/21/20 "X-rays of the right knee reveal moderate medial compartment DJD with joint space narrowing and periarticular spurring.  Mild lateral compartment degenerative change.  Mild to moderate patellofemoral spurring.  No signs of loose body, no stress fracture.  No sign of neoplasm"    Patient Stated Goals reduce pain    Currently in Pain? Yes    Pain Score 6     Pain Location Knee    Pain Orientation Right;Medial    Pain Descriptors / Indicators Aching    Pain Type Chronic pain    Pain Radiating Towards denies    Pain Onset More than a month ago    Pain Frequency Constant    Aggravating  Factors  standing and walking    Pain Relieving Factors rest, pain meds, does not get relief from heat or ice              [facility] PT Assessment - 07/17/20 0001      Assessment   Medical Diagnosis Chronic Pain of Rt knee, Rt knee OA    Referring Provider (PT) Hilts, Michael, MD    Onset Date/Surgical Date --   chronic pain for >1 year   Next MD Visit nothing scheduled    Prior Therapy not for her Rt knee      Precautions   Precautions None      Restrictions   Weight Bearing Restrictions No      Balance Screen   Has the patient fallen in the past 6 months No    Has the patient had a decrease in activity level because of a fear of falling?  No    Is the patient reluctant to leave their home because of a fear of falling?  No      Home Ecologist residence      Prior Function   Level of Independence Independent    Vocation Full time employment  Vocation Requirements standing work at [employer] does not state any      Cognition   Overall Cognitive Status Within Functional Limits for tasks assessed      Observation/Other Assessments-Edema    Edema --   mod edema in Rt knee     Sensation   Light Touch Appears Intact      ROM / Strength   AROM / PROM / Strength AROM;Strength      AROM   AROM Assessment Site Knee    Right/Left Knee Right    Right Knee Extension -2    Right Knee Flexion 125      Strength   Strength Assessment Site Knee;Hip    Right/Left Hip Right;Left    Right Hip Flexion 4/5    Right Hip ABduction 4/5    Left Hip Flexion 4+/5    Right/Left Knee Right;Left    Right Knee Flexion 4/5    Right Knee Extension 4/5    Left Knee Flexion 4+/5    Left Knee Extension 4+/5      Flexibility   Soft Tissue Assessment /Muscle Length --   mod tightness for H.S, and quads     Palpation   Patella mobility decreased mobility but good tracking      Transfers   Transfers Independent with all Transfers       Ambulation/Gait   Ambulation/Gait Yes    Ambulation/Gait Assistance 7: Independent    Ambulation Distance (Feet) 100 Feet    Assistive device None    Gait Pattern Decreased arm swing - right;Decreased arm swing - left;Decreased step length - right;Decreased step length - left;Decreased stance time - right;Decreased stride length;Decreased hip/knee flexion - right;Decreased weight shift to right;Wide base of support    Gait velocity slower                      Objective measurements completed on examination: See above findings.       [facility] Adult PT Treatment/Exercise - 07/17/20 0001      Modalities   Modalities Electrical Stimulation      Electrical Stimulation   Electrical Stimulation Location Rt knee    Electrical Stimulation Action IFC    Electrical Stimulation Parameters to tolerance with legs elevated    Electrical Stimulation Goals Pain                  PT Education - 07/17/20 1643    Education Details HEP, POC,TENS    Person(s) Educated Patient    Methods Explanation;Demonstration;Handout    Comprehension Verbalized understanding;Need further instruction               PT Long Term Goals - 07/17/20 1649      PT LONG TERM GOAL #1   Title Pt will be I and compliant with HEP.    Time 6    Period Weeks    Status New    Target Date 08/28/20      PT LONG TERM GOAL #2   Title Pt will improve Rt knee AROM 0-130 deg to improve funciton.    Time 6    Period Weeks    Status New      PT LONG TERM GOAL #3   Title Pt will improve Rt hip/knee strength to overall 5-/5 to improve function.    Time 6    Period Weeks    Status New      PT LONG TERM GOAL #4  Title Pt will report overall 50% less pain in her knee.    Time 6    Period Weeks    Status New                  Plan - 07/17/20 1645    Clinical Impression Statement Pt presents with chronic Rt knee pain and OA. She has overall decreased ROM, decreased strength, decreased  flexability, and decreased activity tolerance for standing and walking. She will benefit from skilled PT to address her deficits. Pain likely influenced by standing 9 hours for work and may need activity modification to allow for pain and inflammation to subside.    Personal Factors and Comorbidities Comorbidity 3+    Comorbidities PMH: LT TKA,HTN, DM, Rt knee OA,obesity    Examination-Activity Limitations Carry;Squat;Stairs;Stand;Lift;Locomotion Level    Examination-Participation Restrictions Community Activity;Shop;Occupation    Stability/Clinical Decision Making Evolving/Moderate complexity    Clinical Decision Making Moderate    Rehab Potential Good    PT Frequency 2x / week   1-2   PT Duration 6 weeks   4-6   PT Treatment/Interventions ADLs/Self Care Home Management;Cryotherapy;Electrical Stimulation;Iontophoresis 4mg /ml Dexamethasone;Moist Heat;Ultrasound;Therapeutic activities;Therapeutic exercise;Neuromuscular re-education;Manual techniques;Passive range of motion;Dry needling;Joint Manipulations;Taping;Vasopneumatic Device;Gait training    PT Next Visit Plan review and update HEP PRN, consider modalaties, needs hip/knee stretching and strengthening    PT Home Exercise Plan Access Code: [email or web address]    Consulted and Agree with Plan of Care Patient           Patient will benefit from skilled therapeutic intervention in order to improve the following deficits and impairments:  Abnormal gait, Decreased activity tolerance, Decreased endurance, Decreased range of motion, Decreased strength, Increased edema, Hypomobility, Difficulty walking, Increased muscle spasms, Increased fascial restricitons, Impaired flexibility, Pain, Obesity  Visit Diagnosis: Chronic pain of right knee  Muscle weakness (generalized)  Other abnormalities of gait and mobility  Localized edema     Problem List Patient Active Problem List   Diagnosis Date Noted  . Hypertension associated with diabetes ([place])  03/15/2020  . Type 2 diabetes mellitus with hyperglycemia, without long-term current use of insulin ([place]) 03/15/2020  . Elevated liver enzymes 09/01/2019  . Heartburn 09/01/2019  . Hx of colonic polyps 09/01/2019  . Illiterate 09/01/2019  . Influenza vaccination declined 09/01/2019  . RUQ abdominal pain 09/01/2019  . Arthritis of knee 07/14/2019  . Encounter for medical examination to establish care 07/14/2019  . Fatty liver 07/14/2019  . History of total left knee replacement 07/14/2019  . Mixed hyperlipidemia 07/14/2019  . Obesity (BMI 30-39.9) 07/14/2019    Silvestre Mesi 07/17/2020, 4:51 PM  [facility] [address] [place], [place], [address] Phone: [phone]   Fax:  [phone]  Name: Dawn Espinoza MRN: [id number] Date of Birth: 1960/12/15

## 2020-07-17 NOTE — Patient Instructions (Signed)
Access Code: [email or web address] URL: [email or web address] Date: 07/17/2020 Prepared by: Elsie Ra  Exercises Supine Hamstring Stretch with Strap - 2 x daily - 6 x weekly - 1 sets - 2-3 reps - 30 hold Supine Quadriceps Stretch with Strap on Table - 2 x daily - 6 x weekly - 2-3 reps - 30 hold Supine Bridge - 2 x daily - 6 x weekly - 10 reps - 1-2 sets - 5 hold Supine Active Straight Leg Raise - 2 x daily - 6 x weekly - 10 reps - 1-2 sets Sidelying Hip Abduction - 2 x daily - 6 x weekly - 10 reps - 1-3 sets Mini Squat with Counter Support - 2 x daily - 6 x weekly - 1-2 sets - 10 reps

## 2020-08-03 ENCOUNTER — Encounter: Payer: 59 | Admitting: Physical Therapy

## 2020-08-10 ENCOUNTER — Encounter: Payer: 59 | Admitting: Physical Therapy

## 2020-08-10 ENCOUNTER — Telehealth: Payer: Self-pay | Admitting: Physical Therapy

## 2020-08-10 NOTE — Telephone Encounter (Signed)
LVM via interpreter as pt NS for PT appt today.  Provided next appt date and time and advised to call office if she needed to cancel.  Laureen Abrahams, PT, DPT 08/10/20 2:51 PM

## 2020-08-15 ENCOUNTER — Encounter: Payer: 59 | Admitting: Physical Therapy

## 2020-08-16 ENCOUNTER — Encounter: Payer: 59 | Admitting: Physical Therapy

## 2020-08-17 ENCOUNTER — Encounter (HOSPITAL_COMMUNITY): Payer: Self-pay

## 2020-08-17 ENCOUNTER — Other Ambulatory Visit: Payer: Self-pay

## 2020-08-17 ENCOUNTER — Ambulatory Visit (HOSPITAL_COMMUNITY)
Admission: EM | Admit: 2020-08-17 | Discharge: 2020-08-17 | Disposition: A | Discharge status: Home / Self Care | Payer: 59 | Attending: Family Medicine | Admitting: Family Medicine

## 2020-08-17 DIAGNOSIS — R300 Dysuria: Secondary | ICD-10-CM

## 2020-08-17 DIAGNOSIS — N10 Acute tubulo-interstitial nephritis: Secondary | ICD-10-CM | POA: Diagnosis not present

## 2020-08-17 DIAGNOSIS — M549 Dorsalgia, unspecified: Secondary | ICD-10-CM

## 2020-08-17 DIAGNOSIS — R110 Nausea: Secondary | ICD-10-CM | POA: Diagnosis not present

## 2020-08-17 LAB — POCT URINALYSIS DIPSTICK, ED / UC
Bilirubin Urine: NEGATIVE
Glucose, UA: NEGATIVE mg/dL
Ketones, ur: NEGATIVE mg/dL
Nitrite: NEGATIVE
Protein, ur: 100 mg/dL — AB
Specific Gravity, Urine: 1.015 (ref 1.005–1.030)
Urobilinogen, UA: 0.2 mg/dL (ref 0.0–1.0)
pH: 7 (ref 5.0–8.0)

## 2020-08-17 MED ORDER — ONDANSETRON 4 MG PO TBDP
4.0000 mg | ORAL_TABLET | Freq: Once | ORAL | Status: AC
  Administered 2020-08-17: 4 mg via ORAL

## 2020-08-17 MED ORDER — ONDANSETRON 4 MG PO TBDP
4.0000 mg | ORAL_TABLET | Freq: Three times a day (TID) | ORAL | 0 refills | Status: DC | PRN
Start: 2020-08-17 — End: 2022-04-22

## 2020-08-17 MED ORDER — HYDROCODONE-ACETAMINOPHEN 5-325 MG PO TABS: 1.0000 | ORAL_TABLET | Freq: Four times a day (QID) | ORAL | 0 refills | Status: DC | PRN

## 2020-08-17 MED ORDER — KETOROLAC TROMETHAMINE 30 MG/ML IJ SOLN
INTRAMUSCULAR | Status: AC
  Filled 2020-08-17: qty 1

## 2020-08-17 MED ORDER — ONDANSETRON 4 MG PO TBDP
ORAL_TABLET | ORAL | Status: AC
  Filled 2020-08-17: qty 1

## 2020-08-17 MED ORDER — KETOROLAC TROMETHAMINE 30 MG/ML IJ SOLN
15.0000 mg | Freq: Once | INTRAMUSCULAR | Status: AC
  Administered 2020-08-17: 15 mg via INTRAMUSCULAR

## 2020-08-17 MED ORDER — SULFAMETHOXAZOLE-TRIMETHOPRIM 800-160 MG PO TABS: 1.0000 | ORAL_TABLET | Freq: Two times a day (BID) | ORAL | 0 refills | Status: AC

## 2020-08-17 MED ORDER — HYDROCODONE-ACETAMINOPHEN 5-325 MG PO TABS: 1.0000 | ORAL_TABLET | Freq: Once | ORAL | Status: DC

## 2020-08-17 MED ORDER — ACETAMINOPHEN 325 MG PO TABS
650.0000 mg | ORAL_TABLET | Freq: Once | ORAL | Status: AC
  Administered 2020-08-17: 650 mg via ORAL

## 2020-08-17 MED ORDER — ACETAMINOPHEN 325 MG PO TABS
ORAL_TABLET | ORAL | Status: AC
  Filled 2020-08-17: qty 2

## 2020-08-17 NOTE — ED Provider Notes (Signed)
[place]    CSN: [id number] Arrival date & time: 08/17/20  0813      History   Chief Complaint Chief Complaint  Patient presents with  . Dysuria    + COVID  . Back Pain  . Cough  . Nausea    HPI Dawn Espinoza is a 59 y.o. female.   Patient is a Spanish-speaking 59 year old female that presents today with dysuria, nausea, flank pain times approximately 5 days.  Recently diagnosed with Covid approximate 10 days ago.  She still has mild cough.  Denies any fever, chills, body aches.  Has not take any medicine for her symptoms.     Past Medical History:  Diagnosis Date  . Hepatic steatosis   . HLD (hyperlipidemia)   . Hypertension   . Internal hemorrhoids     Patient Active Problem List   Diagnosis Date Noted  . Hypertension associated with diabetes ([place]) 03/15/2020  . Type 2 diabetes mellitus with hyperglycemia, without long-term current use of insulin ([place]) 03/15/2020  . Elevated liver enzymes 09/01/2019  . Heartburn 09/01/2019  . Hx of colonic polyps 09/01/2019  . Illiterate 09/01/2019  . Influenza vaccination declined 09/01/2019  . RUQ abdominal pain 09/01/2019  . Arthritis of knee 07/14/2019  . Encounter for medical examination to establish care 07/14/2019  . Fatty liver 07/14/2019  . History of total left knee replacement 07/14/2019  . Mixed hyperlipidemia 07/14/2019  . Obesity (BMI 30-39.9) 07/14/2019    Past Surgical History:  Procedure Laterality Date  . NO PAST SURGERIES      OB History    Gravida  0   Para  0   Term  0   Preterm  0   AB  0   Living        SAB  0   TAB  0   Ectopic  0   Multiple      Live Births               Home Medications    Prior to Admission medications   Medication Sig Start Date End Date Taking? Authorizing Provider  amLODipine (NORVASC) 5 MG tablet Take 1 tablet (5 mg total) by mouth daily. 06/08/20   Horald Pollen, MD  atorvastatin (LIPITOR) 20 MG tablet Take 1 tablet (20  mg total) by mouth daily. 06/08/20   Horald Pollen, MD  Cholecalciferol (VITAMIN D-3) 125 MCG (5000 UT) TABS Take 1 tablet by mouth daily. 06/21/20   Hilts, Legrand Como, MD  glipiZIDE (GLUCOTROL) 5 MG tablet Take 1 tablet (5 mg total) by mouth daily with breakfast. 06/08/20 09/06/20  Horald Pollen, MD  Glucosamine Sulfate 1000 MG CAPS Take 1 capsule (1,000 mg total) by mouth 2 (two) times daily. 06/21/20   Hilts, Legrand Como, MD  HYDROcodone-acetaminophen (NORCO/VICODIN) 5-325 MG tablet Take 1-2 tablets by mouth every 6 (six) hours as needed. 08/17/20   Caroll Cunnington, Tressia Miners A, NP  Menatetrenone (VITAMIN K2) 100 MCG TABS Take 100 mcg by mouth daily. 06/21/20   Hilts, Legrand Como, MD  nabumetone (RELAFEN) 500 MG tablet Take 1 tablet (500 mg total) by mouth 2 (two) times daily as needed. 06/21/20   Hilts, Legrand Como, MD  ondansetron (ZOFRAN ODT) 4 MG disintegrating tablet Take 1 tablet (4 mg total) by mouth every 8 (eight) hours as needed for nausea or vomiting. 08/17/20   Loura Halt A, NP  sulfamethoxazole-trimethoprim (BACTRIM DS) 800-160 MG tablet Take 1 tablet by mouth 2 (two) times daily for 7 days.  08/17/20 08/24/20  Orvan July, NP    Family History Family History  Problem Relation Age of Onset  . Hypertension Mother   . Alcohol abuse Father   . Heart disease Father     Social History Social History   Tobacco Use  . Smoking status: Never Smoker  . Smokeless tobacco: Never Used  Substance Use Topics  . Alcohol use: No    Alcohol/week: 0.0 standard drinks  . Drug use: No     Allergies   Patient has no known allergies.   Review of Systems Review of Systems   Physical Exam Triage Vital Signs ED Triage Vitals  Enc Vitals Group     BP 08/17/20 0919 100/69     Pulse Rate 08/17/20 0919 87     Resp 08/17/20 0919 20     Temp 08/17/20 0919 99 F (37.2 C)     Temp Source 08/17/20 0919 Oral     SpO2 08/17/20 0919 98 %     Weight --      Height --      Head Circumference --      Peak Flow --       Pain Score 08/17/20 0918 6     Pain Loc --      Pain Edu? --      Excl. in [place]? --    No data found.  Updated Vital Signs BP 100/69 (BP Location: Right Arm)   Pulse 87   Temp 99 F (37.2 C) (Oral)   Resp 20   LMP 05/25/2019   SpO2 98%   Visual Acuity Right Eye Distance:   Left Eye Distance:   Bilateral Distance:    Right Eye Near:   Left Eye Near:    Bilateral Near:     Physical Exam Vitals and nursing note reviewed.  Constitutional:      General: She is not in acute distress.    Appearance: Normal appearance. She is not ill-appearing, toxic-appearing or diaphoretic.  HENT:     Head: Normocephalic.     Nose: Nose normal.  Eyes:     Conjunctiva/sclera: Conjunctivae normal.  Cardiovascular:     Rate and Rhythm: Normal rate and regular rhythm.  Pulmonary:     Effort: Pulmonary effort is normal.     Breath sounds: Normal breath sounds.  Abdominal:     Tenderness: There is right CVA tenderness and left CVA tenderness.  Musculoskeletal:        General: Normal range of motion.     Cervical back: Normal range of motion.  Skin:    General: Skin is warm and dry.     Findings: No rash.  Neurological:     Mental Status: She is alert.  Psychiatric:        Mood and Affect: Mood normal.      UC Treatments / Results  Labs (all labs ordered are listed, but only abnormal results are displayed) Labs Reviewed  POCT URINALYSIS DIPSTICK, ED / UC - Abnormal; Notable for the following components:      Result Value   Hgb urine dipstick TRACE (*)    Protein, ur 100 (*)    Leukocytes,Ua LARGE (*)    All other components within normal limits  URINE CULTURE    EKG   Radiology No results found.  Procedures Procedures (including critical care time)  Medications Ordered in UC Medications  ondansetron (ZOFRAN-ODT) disintegrating tablet 4 mg (4 mg Oral Given 08/17/20 1006)  acetaminophen (TYLENOL)  tablet 650 mg (650 mg Oral Given 08/17/20 1006)  ketorolac (TORADOL)  30 MG/ML injection 15 mg (15 mg Intramuscular Given 08/17/20 1007)    Initial Impression / Assessment and Plan / UC Course  I have reviewed the triage vital signs and the nursing notes.  Pertinent labs & imaging results that were available during my care of the patient were reviewed by me and considered in my medical decision making (see chart for details).     Acute pyelonephritis.  Patient with bilateral flank pain, large leukocytes and trace hemoglobin.  Sending for culture. Lungs clear on exam. Treating symptoms here with Zofran for nausea, low-dose Toradol for pain and Tylenol. Sending Bactrim to the pharmacy to treat for infection and Zofran to use as needed for nausea.  Hydrocodone for pain as needed. Recommended drink plenty of fluids to rehydrate and if symptoms continue or worsen she will need to go to the ER for further management. Patient understanding and agreed to plan.    All information translated using the Spanish interpreter. Final Clinical Impressions(s) / UC Diagnoses   Final diagnoses:  Acute pyelonephritis     Discharge Instructions     Treating you for kidney infection Zofran, toradol and tylenol given here for pain and nausea Sending antibiotics to the pharmacy. Take twice a day for 7 days.  Drink plenty of water.  Hydrocodone for pain as needed.  Follow up as needed for continued or worsening symptoms     ED Prescriptions    Medication Sig Dispense Auth. Provider   sulfamethoxazole-trimethoprim (BACTRIM DS) 800-160 MG tablet Take 1 tablet by mouth 2 (two) times daily for 7 days. 14 tablet Paizleigh Wilds A, NP   ondansetron (ZOFRAN ODT) 4 MG disintegrating tablet Take 1 tablet (4 mg total) by mouth every 8 (eight) hours as needed for nausea or vomiting. 20 tablet Tiari Andringa A, NP   HYDROcodone-acetaminophen (NORCO/VICODIN) 5-325 MG tablet Take 1-2 tablets by mouth every 6 (six) hours as needed. 10 tablet Shanece Cochrane A, NP     I have reviewed the PDMP  during this encounter.   Loura Halt A, NP 08/17/20 1340

## 2020-08-17 NOTE — ED Triage Notes (Signed)
Pt presents with burning when urinating x 5 days; cough, nausea and back pain x 10 days. Pt tested positive for COVID on 08/07/20.

## 2020-08-17 NOTE — Discharge Instructions (Signed)
Treating you for kidney infection Zofran, toradol and tylenol given here for pain and nausea Sending antibiotics to the pharmacy. Take twice a day for 7 days.  Drink plenty of water.  Hydrocodone for pain as needed.  Follow up as needed for continued or worsening symptoms

## 2020-08-19 LAB — URINE CULTURE: Culture: 80000 — AB

## 2020-08-21 ENCOUNTER — Telehealth (HOSPITAL_COMMUNITY): Payer: Self-pay | Admitting: Emergency Medicine

## 2020-08-21 MED ORDER — CEPHALEXIN 500 MG PO CAPS
500.0000 mg | ORAL_CAPSULE | Freq: Four times a day (QID) | ORAL | 0 refills | Status: AC
Start: 2020-08-21 — End: 2020-08-31

## 2020-08-22 ENCOUNTER — Encounter: Payer: 59 | Admitting: Physical Therapy

## 2020-08-23 ENCOUNTER — Encounter: Payer: 59 | Admitting: Physical Therapy

## 2020-08-31 ENCOUNTER — Encounter: Payer: 59 | Admitting: Physical Therapy

## 2020-09-04 ENCOUNTER — Other Ambulatory Visit: Payer: Self-pay

## 2020-09-04 ENCOUNTER — Ambulatory Visit (INDEPENDENT_AMBULATORY_CARE_PROVIDER_SITE_OTHER): Payer: 59 | Admitting: Physical Therapy

## 2020-09-04 DIAGNOSIS — M25561 Pain in right knee: Secondary | ICD-10-CM | POA: Diagnosis not present

## 2020-09-04 DIAGNOSIS — R2689 Other abnormalities of gait and mobility: Secondary | ICD-10-CM

## 2020-09-04 DIAGNOSIS — G8929 Other chronic pain: Secondary | ICD-10-CM

## 2020-09-04 DIAGNOSIS — R600 Localized edema: Secondary | ICD-10-CM | POA: Diagnosis not present

## 2020-09-04 DIAGNOSIS — M6281 Muscle weakness (generalized): Secondary | ICD-10-CM

## 2020-09-04 NOTE — Addendum Note (Signed)
Addended by: Debbe Odea on: 09/04/2020 04:36 PM   Modules accepted: Orders

## 2020-09-04 NOTE — Therapy (Signed)
[facility] [address] [place], [place], [address] Phone: [phone]   Fax:  [phone]  Physical Therapy Treatment/Recertification  Patient Details  Name: Dawn Espinoza MRN: [id number] Date of Birth: 1961/05/06 Referring Provider (PT): Eunice Blase, MD   Encounter Date: 09/04/2020   PT End of Session - 09/04/20 1630    Visit Number 2    Number of Visits 12    Date for PT Re-Evaluation 10/16/20    Authorization Type [employer]    PT Start Time 1600    PT Stop Time 1640    PT Time Calculation (min) 40 min    Activity Tolerance Patient tolerated treatment well    Behavior During Therapy [facility] for tasks assessed/performed           Past Medical History:  Diagnosis Date  . Hepatic steatosis   . HLD (hyperlipidemia)   . Hypertension   . Internal hemorrhoids     Past Surgical History:  Procedure Laterality Date  . NO PAST SURGERIES      There were no vitals filed for this visit.   Subjective Assessment - 09/04/20 1614    Subjective relays she had a severe case of COVID so she has not been back to PT since eval. She says her Rt knee has hurt more since getting covid. Relays 7/10 pain.    Pertinent History PMH: LT TKA,HTN, DM, Rt knee OA,obesity    Limitations Walking;Standing    Diagnostic tests Rt knee XR 06/21/20 "X-rays of the right knee reveal moderate medial compartment DJD with joint space narrowing and periarticular spurring.  Mild lateral compartment degenerative change.  Mild to moderate patellofemoral spurring.  No signs of loose body, no stress fracture.  No sign of neoplasm"    Patient Stated Goals reduce pain    Pain Onset More than a month ago              [facility] PT Assessment - 09/04/20 0001      Assessment   Medical Diagnosis Chronic Pain of Rt knee, Rt knee OA    Referring Provider (PT) Hilts, Michael, MD      AROM   Right Knee Extension -2    Right Knee Flexion 125      Strength   Right Hip Flexion  4/5    Right Hip ABduction 4/5    Left Hip Flexion 4+/5    Right Knee Flexion 4/5    Right Knee Extension 4/5    Left Knee Flexion 4+/5    Left Knee Extension 4+/5                         [facility] Adult PT Treatment/Exercise - 09/04/20 0001      Exercises   Exercises Knee/Hip      Knee/Hip Exercises: Stretches   Knee: Self-Stretch Limitations seated tailgate stretch 10 sec X 10 reps bilat      Knee/Hip Exercises: Aerobic   Recumbent Bike L1 X 6 min seat 2      Knee/Hip Exercises: Machines for Strengthening   Cybex Knee Extension 5 lbs 3X10 reps bilat push    Cybex Knee Flexion 25 lbs 2X15 bilat pull    Total Gym Leg Press bilat push 62 lbs 3X10 then dropped down to 37 lbs unilat 3X10 on both sides      Electrical Stimulation   Electrical Stimulation Location Rt knee    Electrical Stimulation Action IFC    Electrical Stimulation  Parameters tolerance with legs elevated    Electrical Stimulation Goals Pain                       PT Long Term Goals - 09/04/20 1632      PT LONG TERM GOAL #1   Title Pt will be I and compliant with HEP.    Baseline has not been able to perform due to being sick with covid    Time 6    Period Weeks    Status On-going      PT LONG TERM GOAL #2   Title Pt will improve Rt knee AROM 0-130 deg to improve funciton.    Time 6    Period Weeks    Status On-going      PT LONG TERM GOAL #3   Title Pt will improve Rt hip/knee strength to overall 5-/5 to improve function.    Baseline 4/5    Time 6    Period Weeks    Status On-going      PT LONG TERM GOAL #4   Title Pt will report overall 50% less pain in her knee.    Baseline currently 7/10    Time 6    Period Weeks    Status On-going                 Plan - 09/04/20 1631    Clinical Impression Statement She has not been back to PT since Eval due to covid so still with deficits in ROM, strength, and decreased activity tolerance for standing and walking. She  will benefit from skilled PT to address these deficits for another 6 weeks.    Personal Factors and Comorbidities Comorbidity 3+    Comorbidities PMH: LT TKA,HTN, DM, Rt knee OA,obesity    Examination-Activity Limitations Carry;Squat;Stairs;Stand;Lift;Locomotion Level    Examination-Participation Restrictions Community Activity;Shop;Occupation    Stability/Clinical Decision Making Evolving/Moderate complexity    Rehab Potential Good    PT Frequency 2x / week   1-2   PT Duration 6 weeks   4-6   PT Treatment/Interventions ADLs/Self Care Home Management;Cryotherapy;Electrical Stimulation;Iontophoresis 4mg /ml Dexamethasone;Moist Heat;Ultrasound;Therapeutic activities;Therapeutic exercise;Neuromuscular re-education;Manual techniques;Passive range of motion;Dry needling;Joint Manipulations;Taping;Vasopneumatic Device;Gait training    PT Next Visit Plan consider modalaties, needs hip/knee stretching and strengthening    PT Home Exercise Plan Access Code: [email or web address]    Consulted and Agree with Plan of Care Patient           Patient will benefit from skilled therapeutic intervention in order to improve the following deficits and impairments:  Abnormal gait, Decreased activity tolerance, Decreased endurance, Decreased range of motion, Decreased strength, Increased edema, Hypomobility, Difficulty walking, Increased muscle spasms, Increased fascial restricitons, Impaired flexibility, Pain, Obesity  Visit Diagnosis: Chronic pain of right knee  Muscle weakness (generalized)  Other abnormalities of gait and mobility  Localized edema     Problem List Patient Active Problem List   Diagnosis Date Noted  . Hypertension associated with diabetes ([place]) 03/15/2020  . Type 2 diabetes mellitus with hyperglycemia, without long-term current use of insulin ([place]) 03/15/2020  . Elevated liver enzymes 09/01/2019  . Heartburn 09/01/2019  . Hx of colonic polyps 09/01/2019  . Illiterate 09/01/2019  . Influenza  vaccination declined 09/01/2019  . RUQ abdominal pain 09/01/2019  . Arthritis of knee 07/14/2019  . Encounter for medical examination to establish care 07/14/2019  . Fatty liver 07/14/2019  . History of total left knee replacement 07/14/2019  . Mixed hyperlipidemia 07/14/2019  .  Obesity (BMI 30-39.9) 07/14/2019    Silvestre Mesi 09/04/2020, 4:34 PM  [facility] Physical Therapy [address] [place], [place], [address] Phone: [phone]   Fax:  [phone]  Name: Daisy Lites MRN: [id number] Date of Birth: 1961-03-19

## 2020-09-11 ENCOUNTER — Encounter: Payer: Self-pay | Admitting: Emergency Medicine

## 2020-09-11 ENCOUNTER — Other Ambulatory Visit: Payer: Self-pay

## 2020-09-11 ENCOUNTER — Ambulatory Visit: Payer: 59 | Admitting: Emergency Medicine

## 2020-09-11 VITALS — BP 131/75 | HR 77 | Temp 98.2°F | Ht 61.0 in | Wt 171.2 lb

## 2020-09-11 DIAGNOSIS — I152 Hypertension secondary to endocrine disorders: Secondary | ICD-10-CM | POA: Diagnosis not present

## 2020-09-11 DIAGNOSIS — E785 Hyperlipidemia, unspecified: Secondary | ICD-10-CM

## 2020-09-11 DIAGNOSIS — E1169 Type 2 diabetes mellitus with other specified complication: Secondary | ICD-10-CM

## 2020-09-11 DIAGNOSIS — E1159 Type 2 diabetes mellitus with other circulatory complications: Secondary | ICD-10-CM | POA: Diagnosis not present

## 2020-09-11 LAB — GLUCOSE, POCT (MANUAL RESULT ENTRY): POC Glucose: 119 mg/dl — AB (ref 70–99)

## 2020-09-11 NOTE — Patient Instructions (Addendum)
If you have lab work done today you will be contacted with your lab results within the next 2 weeks.  If you have not heard from [place] then please contact us. The fastest way to get your results is to register for My Chart.   IF you received an x-ray today, you will receive an invoice from [facility] Radiology. Please contact [facility] at [phone] with questions or concerns regarding your invoice.   IF you received labwork today, you will receive an invoice from [place]. Please contact [facility] at [phone] with questions or concerns regarding your invoice.   Our billing staff will not be able to assist you with questions regarding bills from these companies.  You will be contacted with the lab results as soon as they are available. The fastest way to get your results is to activate your My Chart account. Instructions are located on the last page of this paperwork. If you have not heard from [place] regarding the results in 2 weeks, please contact this office.      Diabetes mellitus y nutricin, en adultos Diabetes Mellitus and Nutrition, Adult Si sufre de diabetes (diabetes mellitus), es muy importante tener hbitos alimenticios saludables debido a que sus niveles de Designer, television/film set sangre (glucosa) se ven afectados en gran medida por lo que come y bebe. [place] alimentos saludables en las cantidades [place], aproximadamente a la [employer], [place] ayudar a:  Aeronautical engineer glucemia.  Disminuir el riesgo de sufrir una enfermedad cardaca.  Mejorar la presin arterial.  Science writer o mantener un peso saludable. Todas las personas que sufren de diabetes son diferentes y cada una tiene necesidades diferentes en cuanto a un plan de alimentacin. El mdico puede recomendarle que trabaje con un especialista en dietas y nutricin (nutricionista) para Financial trader plan para usted. Su plan de alimentacin puede variar segn factores como:  Las caloras que  necesita.  Los medicamentos que toma.  Su peso.  Sus niveles de glucemia, presin arterial y colesterol.  Su nivel de [place].  Otras afecciones que tenga, como enfermedades cardacas o renales. Cmo me afectan los carbohidratos? Los carbohidratos, o hidratos de carbono, afectan su nivel de glucemia ms que cualquier otro tipo de alimento. La ingesta de carbohidratos naturalmente aumenta la cantidad de [employer]. El recuento de carbohidratos es un mtodo destinado a Catering manager un registro de la cantidad de carbohidratos que se consumen. El recuento de carbohidratos es importante para Theatre manager la glucemia a un nivel saludable, especialmente si utiliza insulina o toma determinados medicamentos por va oral para la diabetes. Es importante conocer la cantidad de carbohidratos que se pueden ingerir en cada comida sin correr Engineer, manufacturing. Esto es Psychologist, forensic. Su nutricionista puede ayudarlo a calcular la cantidad de carbohidratos que debe ingerir en cada comida y en cada refrigerio. Entre los alimentos que contienen carbohidratos, se incluyen:  Pan, cereal, arroz, pastas y galletas.  Papas y maz.  Guisantes, frijoles y lentejas.  Leche y Estate agent.  Lambert Mody y [place].  Postres, como pasteles, galletas, helado y caramelos. Cmo me afecta el alcohol? El alcohol puede provocar disminuciones sbitas de la glucemia (hipoglucemia), especialmente si utiliza insulina o toma determinados medicamentos por va oral para la diabetes. La hipoglucemia es una afeccin potencialmente mortal. Los sntomas de la hipoglucemia (somnolencia, mareos y confusin) son similares a los sntomas de haber consumido demasiado alcohol. Si el mdico afirma que el alcohol es seguro para usted, [place] estas pautas:  Limite el consumo de alcohol a no ms de 54medida por da si es mujer y no est [place], y a 37medidas si es hombre. Una medida equivale a 12oz (380ml) de cerveza, 5oz (157ml) de vino o  1oz (94ml) de bebidas alcohlicas de alta graduacin.  No beba con el estmago vaco.  Mantngase hidratado bebiendo agua, refrescos dietticos o t helado sin azcar.  Tenga en cuenta que los refrescos comunes, los jugos y otras bebida para Optician, dispensing pueden contener mucha azcar y se deben contar como carbohidratos. Cules son algunos consejos para seguir este plan?  Leer las etiquetas de los alimentos  Comience por leer el tamao de la porcin en la "Informacin nutricional" en las etiquetas de los alimentos envasados y las bebidas. La cantidad de caloras, carbohidratos, grasas y otros nutrientes mencionados en la etiqueta se basan en una porcin del alimento. Muchos alimentos contienen ms de una porcin por envase.  Verifique la cantidad total de gramos (g) de carbohidratos totales en una porcin. Puede calcular la cantidad de porciones de carbohidratos al dividir el total de carbohidratos por 15. Por ejemplo, si un alimento tiene un total de 30g de carbohidratos, equivale a 2 porciones de carbohidratos.  Verifique la cantidad de gramos (g) de grasas saturadas y grasas trans en una porcin. Escoja alimentos que no contengan grasa o que tengan un bajo contenido.  Verifique la cantidad de miligramos (mg) de sal (sodio) en una porcin. La [employer] de las personas deben limitar la ingesta de sodio total a menos de 2300mg  por Training and development officer.  Siempre consulte la informacin nutricional de los alimentos etiquetados como "con bajo contenido de grasa" o "sin grasa". Estos alimentos pueden tener un mayor contenido de Location manager agregada o carbohidratos refinados, y deben evitarse.  Hable con su nutricionista para identificar sus objetivos diarios en cuanto a los nutrientes mencionados en la etiqueta. Al ir de compras  Evite comprar alimentos procesados, enlatados o precocinados. Estos alimentos tienden a Special educational needs teacher mayor cantidad de [place], sodio y azcar agregada.  Compre en la zona exterior de la tienda de  comestibles. Esta zona incluye frutas y verduras frescas, granos a granel, carnes frescas y productos lcteos frescos. Al cocinar  Utilice mtodos de coccin a baja temperatura, como hornear, en lugar de mtodos de coccin a alta temperatura, como frer en abundante aceite.  Cocine con aceites saludables, como el aceite de [place], canola o [place].  Evite cocinar con manteca, crema o carnes con alto contenido de grasa. Planificacin de las comidas  Coma las comidas y los refrigerios regularmente, preferentemente a la misma hora todos [place]. Evite pasar largos perodos de tiempo sin comer.  Consuma alimentos ricos en fibra, como frutas frescas, verduras, frijoles y cereales integrales. Consulte a su nutricionista sobre cuntas porciones de carbohidratos puede consumir en cada comida.  Consuma entre 4 y 6 onzas (oz) de protenas magras por da, como carnes [place], pollo, pescado, huevos o tofu. Una onza de protena magra equivale a: ? 1 onza de carne, pollo o pescado. ? 1huevo. ?  taza de tofu.  Coma algunos alimentos por da que contengan grasas saludables, como aguacates, frutos secos, semillas y pescado. Estilo de vida  Controle su nivel de glucemia con regularidad.  Haga actividad fsica habitualmente como se lo haya indicado el mdico. Esto puede incluir lo siguiente: ? 125minutos semanales de ejercicio de intensidad moderada o alta. Esto podra incluir caminatas dinmicas, ciclismo o gimnasia acutica. ? Realizar ejercicios de elongacin y de fortalecimiento, como yoga o levantamiento  de pesas, por lo menos 2veces por semana.  Tome los [employer] se lo haya indicado el mdico.  No consuma ningn producto que contenga nicotina o tabaco, como cigarrillos y Psychologist, sport and exercise. Si necesita ayuda para dejar de fumar, consulte al [employer] con un asesor o instructor en diabetes para identificar estrategias para controlar el estrs y cualquier desafo emocional  y social. Preguntas para hacerle al mdico  Es necesario que consulte a Radio broadcast assistant en el cuidado de la diabetes?  Es necesario que me rena con un nutricionista?  A qu nmero puedo llamar si tengo preguntas?  Cules son los mejores momentos para controlar la glucemia? Dnde encontrar ms informacin:  Asociacin Estadounidense de la Diabetes (American Diabetes Association): diabetes.org  Academia de Nutricin y Information systems manager (Academy of Nutrition and Dietetics): www.eatright.[place] Diabetes y las Enfermedades Digestivas y [place] [facility] of Diabetes and Digestive and Kidney Diseases, NIH): [employer]inesFuneral.dk Resumen  Un plan de alimentacin saludable lo ayudar a Aeronautical engineer glucemia y Theatre manager un estilo de vida saludable.  Trabajar con un especialista en dietas y nutricin (nutricionista) puede ayudarlo a Insurance claims handler de alimentacin para usted.  Tenga en cuenta que los carbohidratos (hidratos de carbono) y el alcohol tienen efectos inmediatos en sus niveles de glucemia. Es importante contar los carbohidratos que ingiere y consumir alcohol con prudencia. Esta informacin no tiene Marine scientist el consejo del mdico. Asegrese de hacerle al mdico cualquier pregunta que tenga. Document Revised: 07/15/2017 Document Reviewed: 02/24/2017 Elsevier Patient Education  [place].

## 2020-09-11 NOTE — Progress Notes (Signed)
Dawn Espinoza 59 y.o.   Chief Complaint  Patient presents with  . Diabetes    3  m f/u     HISTORY OF PRESENT ILLNESS: This is a 59 y.o. female with history of diabetes here for follow-up. Presently on glipizide 5 mg daily, amlodipine 5 mg daily, and atorvastatin 20 mg daily. No complaints or medical concerns today. Had Covid infection last September 2021.  HPI   Prior to Admission medications   Medication Sig Start Date End Date Taking? Authorizing Provider  amLODipine (NORVASC) 5 MG tablet Take 1 tablet (5 mg total) by mouth daily. 06/08/20  Yes Ulrick Methot, Ines Bloomer, MD  atorvastatin (LIPITOR) 20 MG tablet Take 1 tablet (20 mg total) by mouth daily. 06/08/20  Yes Kirin Brandenburger, Ines Bloomer, MD  Cholecalciferol (VITAMIN D-3) 125 MCG (5000 UT) TABS Take 1 tablet by mouth daily. 06/21/20  Yes Hilts, Legrand Como, MD  Glucosamine Sulfate 1000 MG CAPS Take 1 capsule (1,000 mg total) by mouth 2 (two) times daily. 06/21/20  Yes Hilts, Legrand Como, MD  HYDROcodone-acetaminophen (NORCO/VICODIN) 5-325 MG tablet Take 1-2 tablets by mouth every 6 (six) hours as needed. 08/17/20  Yes Bast, Traci A, NP  glipiZIDE (GLUCOTROL) 5 MG tablet Take 1 tablet (5 mg total) by mouth daily with breakfast. Patient not taking: Reported on 09/11/2020 06/08/20 09/11/20  Horald Pollen, MD  Menatetrenone (VITAMIN K2) 100 MCG TABS Take 100 mcg by mouth daily. Patient not taking: Reported on 09/11/2020 06/21/20   Hilts, Legrand Como, MD  nabumetone (RELAFEN) 500 MG tablet Take 1 tablet (500 mg total) by mouth 2 (two) times daily as needed. 06/21/20   Hilts, Legrand Como, MD  ondansetron (ZOFRAN ODT) 4 MG disintegrating tablet Take 1 tablet (4 mg total) by mouth every 8 (eight) hours as needed for nausea or vomiting. Patient not taking: Reported on 09/11/2020 08/17/20   Orvan July, NP    No Known Allergies  Patient Active Problem List   Diagnosis Date Noted  . Hypertension associated with diabetes ([facility]) 03/15/2020  . Type 2 diabetes  mellitus with hyperglycemia, without long-term current use of insulin ([place]) 03/15/2020  . Elevated liver enzymes 09/01/2019  . Heartburn 09/01/2019  . Hx of colonic polyps 09/01/2019  . Illiterate 09/01/2019  . Influenza vaccination declined 09/01/2019  . RUQ abdominal pain 09/01/2019  . Arthritis of knee 07/14/2019  . Encounter for medical examination to establish care 07/14/2019  . Fatty liver 07/14/2019  . History of total left knee replacement 07/14/2019  . Mixed hyperlipidemia 07/14/2019  . Obesity (BMI 30-39.9) 07/14/2019    Past Medical History:  Diagnosis Date  . Hepatic steatosis   . HLD (hyperlipidemia)   . Hypertension   . Internal hemorrhoids     Past Surgical History:  Procedure Laterality Date  . NO PAST SURGERIES      Social History   Socioeconomic History  . Marital status: Single    Spouse name: Not on file  . Number of children: 6  . Years of education: Not on file  . Highest education level: Not on file  Occupational History  . Not on file  Tobacco Use  . Smoking status: Never Smoker  . Smokeless tobacco: Never Used  Substance and Sexual Activity  . Alcohol use: No    Alcohol/week: 0.0 standard drinks  . Drug use: No  . Sexual activity: Never  Other Topics Concern  . Not on file  Social History Narrative   ** Merged History Encounter **  Social Determinants of Health   Financial Resource Strain:   . Difficulty of Paying Living Expenses: Not on file  Food Insecurity:   . Worried About Charity fundraiser in the Last Year: Not on file  . Ran Out of Food in the Last Year: Not on file  Transportation Needs:   . Lack of Transportation (Medical): Not on file  . Lack of Transportation (Non-Medical): Not on file  Physical Activity:   . Days of Exercise per Week: Not on file  . Minutes of Exercise per Session: Not on file  Stress:   . Feeling of Stress : Not on file  Social Connections:   . Frequency of Communication with Friends and  Family: Not on file  . Frequency of Social Gatherings with Friends and Family: Not on file  . Attends Religious Services: Not on file  . Active Member of Clubs or Organizations: Not on file  . Attends Archivist Meetings: Not on file  . Marital Status: Not on file  Intimate Partner Violence:   . Fear of Current or Ex-Partner: Not on file  . Emotionally Abused: Not on file  . Physically Abused: Not on file  . Sexually Abused: Not on file    Family History  Problem Relation Age of Onset  . Hypertension Mother   . Alcohol abuse Father   . Heart disease Father      Review of Systems  Constitutional: Negative.  Negative for chills and fever.  HENT: Negative.  Negative for congestion and sore throat.   Respiratory: Negative.  Negative for cough and shortness of breath.   Cardiovascular: Negative.  Negative for chest pain and palpitations.  Gastrointestinal: Negative.  Negative for abdominal pain, blood in stool, melena, nausea and vomiting.  Genitourinary: Negative.  Negative for dysuria and hematuria.  Musculoskeletal: Negative.  Negative for myalgias and neck pain.  Skin: Negative.  Negative for rash.  Neurological: Negative.  Negative for dizziness and headaches.  All other systems reviewed and are negative.  Today's Vitals   09/11/20 1519  BP: 131/75  Pulse: 77  Temp: 98.2 F (36.8 C)  TempSrc: Temporal  SpO2: 97%  Weight: 171 lb 3.2 oz (77.7 kg)  Height: 5\' 1"  (1.549 m)   Body mass index is 32.35 kg/m.   Physical Exam Vitals reviewed.  Constitutional:      Appearance: Normal appearance.  HENT:     Head: Normocephalic.  Eyes:     Extraocular Movements: Extraocular movements intact.     Conjunctiva/sclera: Conjunctivae normal.     Pupils: Pupils are equal, round, and reactive to light.  Cardiovascular:     Rate and Rhythm: Normal rate and regular rhythm.     Pulses: Normal pulses.     Heart sounds: Normal heart sounds.  Pulmonary:     Effort:  Pulmonary effort is normal.     Breath sounds: Normal breath sounds.  Musculoskeletal:        General: Normal range of motion.     Cervical back: Normal range of motion and neck supple.  Skin:    General: Skin is warm and dry.     Capillary Refill: Capillary refill takes less than 2 seconds.  Neurological:     General: No focal deficit present.     Mental Status: She is alert and oriented to person, place, and time.  Psychiatric:        Mood and Affect: Mood normal.        Behavior:  Behavior normal.     Results for orders placed or performed in visit on 09/11/20 (from the past 24 hour(s))  POCT glucose (manual entry)     Status: Abnormal   Collection Time: 09/11/20  3:27 PM  Result Value Ref Range   POC Glucose 119 (A) 70 - 99 mg/dl   A total of 30 minutes was spent with the patient, greater than 50% of which was in counseling/coordination of care regarding diabetes, hypertension, dyslipidemia and cardiovascular risks associated with these conditions, review of all medications, review of most recent office visit notes, review of most recent blood work results, education on nutrition, documentation, prognosis and need for follow-up.  ASSESSMENT & PLAN:  Clinically stable.  No medical concerns identified during this visit.  Continue present medications.  No changes.  Follow-up in 3 months.  Dawn Espinoza was seen today for diabetes.  Diagnoses and all orders for this visit:  Hypertension associated with diabetes ([place]) -     POCT glucose (manual entry) -     Cancel: POCT glycosylated hemoglobin (Hb A1C)  Dyslipidemia associated with type 2 diabetes mellitus ([place]) -     Hemoglobin A1c    Patient Instructions       If you have lab work done today you will be contacted with your lab results within the next 2 weeks.  If you have not heard from [place] then please contact us. The fastest way to get your results is to register for My Chart.   IF you received an x-ray today, you will  receive an invoice from [facility]. Please contact [facility] at [phone] with questions or concerns regarding your invoice.   IF you received labwork today, you will receive an invoice from [place]. Please contact [facility] at [phone] with questions or concerns regarding your invoice.   Our billing staff will not be able to assist you with questions regarding bills from these companies.  You will be contacted with the lab results as soon as they are available. The fastest way to get your results is to activate your My Chart account. Instructions are located on the last page of this paperwork. If you have not heard from [place] regarding the results in 2 weeks, please contact this office.      Diabetes mellitus y nutricin, en adultos Diabetes Mellitus and Nutrition, Adult Si sufre de diabetes (diabetes mellitus), es muy importante tener hbitos alimenticios saludables debido a que sus niveles de Designer, television/film set sangre (glucosa) se ven afectados en gran medida por lo que come y bebe. [place] alimentos saludables en las cantidades [place], aproximadamente a la [employer], [place] ayudar a:  Aeronautical engineer glucemia.  Disminuir el riesgo de sufrir una enfermedad cardaca.  Mejorar la presin arterial.  Science writer o mantener un peso saludable. Todas las personas que sufren de diabetes son diferentes y cada una tiene necesidades diferentes en cuanto a un plan de alimentacin. El mdico puede recomendarle que trabaje con un especialista en dietas y nutricin (nutricionista) para Financial trader plan para usted. Su plan de alimentacin puede variar segn factores como:  Las caloras que necesita.  Los medicamentos que toma.  Su peso.  Sus niveles de glucemia, presin arterial y colesterol.  Su nivel de [place].  Otras afecciones que tenga, como enfermedades cardacas o renales. Cmo me afectan los carbohidratos? Los carbohidratos, o hidratos de carbono,  afectan su nivel de glucemia ms que cualquier otro tipo de alimento. La ingesta de carbohidratos naturalmente aumenta  la cantidad de [employer]. El recuento de carbohidratos es un mtodo destinado a Catering manager un registro de la cantidad de carbohidratos que se consumen. El recuento de carbohidratos es importante para Theatre manager la glucemia a un nivel saludable, especialmente si utiliza insulina o toma determinados medicamentos por va oral para la diabetes. Es importante conocer la cantidad de carbohidratos que se pueden ingerir en cada comida sin correr Engineer, manufacturing. Esto es Psychologist, forensic. Su nutricionista puede ayudarlo a calcular la cantidad de carbohidratos que debe ingerir en cada comida y en cada refrigerio. Entre los alimentos que contienen carbohidratos, se incluyen:  Pan, cereal, arroz, pastas y galletas.  Papas y maz.  Guisantes, frijoles y lentejas.  Leche y Estate agent.  Lambert Mody y [place].  Postres, como pasteles, galletas, helado y caramelos. Cmo me afecta el alcohol? El alcohol puede provocar disminuciones sbitas de la glucemia (hipoglucemia), especialmente si utiliza insulina o toma determinados medicamentos por va oral para la diabetes. La hipoglucemia es una afeccin potencialmente mortal. Los sntomas de la hipoglucemia (somnolencia, mareos y confusin) son similares a los sntomas de haber consumido demasiado alcohol. Si el mdico afirma que el alcohol es seguro para usted, [place] estas pautas:  Limite el consumo de alcohol a no ms de 35medida por da si es mujer y no est [place], y a 48medidas si es hombre. Una medida equivale a 12oz (317ml) de cerveza, 5oz (177ml) de vino o 1oz (77ml) de bebidas alcohlicas de alta graduacin.  No beba con el estmago vaco.  Mantngase hidratado bebiendo agua, refrescos dietticos o t helado sin azcar.  Tenga en cuenta que los refrescos comunes, los jugos y otras bebida para Optician, dispensing pueden contener mucha azcar  y se deben contar como carbohidratos. Cules son algunos consejos para seguir este plan?  Leer las etiquetas de los alimentos  Comience por leer el tamao de la porcin en la "Informacin nutricional" en las etiquetas de los alimentos envasados y las bebidas. La cantidad de caloras, carbohidratos, grasas y otros nutrientes mencionados en la etiqueta se basan en una porcin del alimento. Muchos alimentos contienen ms de una porcin por envase.  Verifique la cantidad total de gramos (g) de carbohidratos totales en una porcin. Puede calcular la cantidad de porciones de carbohidratos al dividir el total de carbohidratos por 15. Por ejemplo, si un alimento tiene un total de 30g de carbohidratos, equivale a 2 porciones de carbohidratos.  Verifique la cantidad de gramos (g) de grasas saturadas y grasas trans en una porcin. Escoja alimentos que no contengan grasa o que tengan un bajo contenido.  Verifique la cantidad de miligramos (mg) de sal (sodio) en una porcin. La mayora de las personas deben limitar la ingesta de sodio total a menos de 2300mg  por Training and development officer.  Siempre consulte la informacin nutricional de los alimentos etiquetados como "con bajo contenido de grasa" o "sin grasa". Estos alimentos pueden tener un mayor contenido de Location manager agregada o carbohidratos refinados, y deben evitarse.  Hable con su nutricionista para identificar sus objetivos diarios en cuanto a los nutrientes mencionados en la etiqueta. Al ir de compras  Evite comprar alimentos procesados, enlatados o precocinados. Estos alimentos tienden a Special educational needs teacher mayor cantidad de [place], sodio y azcar agregada.  Compre en la zona exterior de la tienda de comestibles. Esta zona incluye frutas y verduras frescas, granos a granel, carnes frescas y productos lcteos frescos. Al cocinar  Utilice mtodos de coccin a baja temperatura, como hornear, en lugar de mtodos de coccin a  alta temperatura, como frer en abundante aceite.  Cocine  con aceites saludables, como el aceite de [place], canola o [place].  Evite cocinar con manteca, crema o carnes con alto contenido de grasa. Planificacin de las comidas  Coma las comidas y los refrigerios regularmente, preferentemente a la misma hora todos [place]. Evite pasar largos perodos de tiempo sin comer.  Consuma alimentos ricos en fibra, como frutas frescas, verduras, frijoles y cereales integrales. Consulte a su nutricionista sobre cuntas porciones de carbohidratos puede consumir en cada comida.  Consuma entre 4 y 6 onzas (oz) de protenas magras por da, como carnes [place], pollo, pescado, huevos o tofu. Una onza de protena magra equivale a: ? 1 onza de carne, pollo o pescado. ? 1huevo. ?  taza de tofu.  Coma algunos alimentos por da que contengan grasas saludables, como aguacates, frutos secos, semillas y pescado. Estilo de vida  Controle su nivel de glucemia con regularidad.  Haga actividad fsica habitualmente como se lo haya indicado el mdico. Esto puede incluir lo siguiente: ? 142minutos semanales de ejercicio de intensidad moderada o alta. Esto podra incluir caminatas dinmicas, ciclismo o gimnasia acutica. ? Realizar ejercicios de elongacin y de fortalecimiento, como yoga o levantamiento de pesas, por lo menos 2veces por semana.  Tome los [employer] se lo haya indicado el mdico.  No consuma ningn producto que contenga nicotina o tabaco, como cigarrillos y Psychologist, sport and exercise. Si necesita ayuda para dejar de fumar, consulte al [employer] con un asesor o instructor en diabetes para identificar estrategias para controlar el estrs y cualquier desafo emocional y social. Preguntas para hacerle al mdico  Es necesario que consulte a Radio broadcast assistant en el cuidado de la diabetes?  Es necesario que me rena con un nutricionista?  A qu nmero puedo llamar si tengo preguntas?  Cules son los mejores momentos para controlar la  glucemia? Dnde encontrar ms informacin:  Asociacin Estadounidense de la Diabetes (American Diabetes Association): diabetes.org  Academia de Nutricin y Information systems manager (Academy of Nutrition and Dietetics): www.eatright.Eclectic Diabetes y las Enfermedades Digestivas y [facility] of Diabetes and Digestive and Kidney Diseases, NIH): [employer]inesFuneral.dk Resumen  Un plan de alimentacin saludable lo ayudar a Aeronautical engineer glucemia y Theatre manager un estilo de vida saludable.  Trabajar con un especialista en dietas y nutricin (nutricionista) puede ayudarlo a Insurance claims handler de alimentacin para usted.  Tenga en cuenta que los carbohidratos (hidratos de carbono) y el alcohol tienen efectos inmediatos en sus niveles de glucemia. Es importante contar los carbohidratos que ingiere y consumir alcohol con prudencia. Esta informacin no tiene Marine scientist el consejo del mdico. Asegrese de hacerle al mdico cualquier pregunta que tenga. Document Revised: 07/15/2017 Document Reviewed: 02/24/2017 Elsevier Patient Education  2020 Elsevier Inc.      Agustina Caroli, MD [facility]

## 2020-09-12 LAB — HEMOGLOBIN A1C
Est. average glucose Bld gHb Est-mCnc: 140 mg/dL
Hgb A1c MFr Bld: 6.5 % — ABNORMAL HIGH (ref 4.8–5.6)

## 2020-09-20 ENCOUNTER — Encounter: Payer: Self-pay | Admitting: Physical Therapy

## 2020-09-20 ENCOUNTER — Other Ambulatory Visit: Payer: Self-pay

## 2020-09-20 ENCOUNTER — Ambulatory Visit (INDEPENDENT_AMBULATORY_CARE_PROVIDER_SITE_OTHER): Payer: 59 | Admitting: Physical Therapy

## 2020-09-20 DIAGNOSIS — R600 Localized edema: Secondary | ICD-10-CM | POA: Diagnosis not present

## 2020-09-20 DIAGNOSIS — M25561 Pain in right knee: Secondary | ICD-10-CM | POA: Diagnosis not present

## 2020-09-20 DIAGNOSIS — R2689 Other abnormalities of gait and mobility: Secondary | ICD-10-CM

## 2020-09-20 DIAGNOSIS — G8929 Other chronic pain: Secondary | ICD-10-CM

## 2020-09-20 DIAGNOSIS — M6281 Muscle weakness (generalized): Secondary | ICD-10-CM

## 2020-09-20 NOTE — Patient Instructions (Signed)

## 2020-09-20 NOTE — Therapy (Signed)
[facility] Physical Therapy [address] [place], [place], [address] Phone: [phone]   Fax:  [phone]  Physical Therapy Treatment  Patient Details  Name: Dawn Espinoza MRN: [id number] Date of Birth: [birth year] Referring Provider (PT): Eunice Blase, MD   Encounter Date: 09/20/2020   PT End of Session - 09/20/20 1516    Visit Number 3    Number of Visits 12    Date for PT Re-Evaluation 10/16/20    Authorization Type [employer]    PT Start Time 1510    PT Stop Time 1555    PT Time Calculation (min) 45 min    Activity Tolerance Patient tolerated treatment well    Behavior During Therapy [facility] for tasks assessed/performed           Past Medical History:  Diagnosis Date  . Hepatic steatosis   . HLD (hyperlipidemia)   . Hypertension   . Internal hemorrhoids     Past Surgical History:  Procedure Laterality Date  . NO PAST SURGERIES      There were no vitals filed for this visit.   Subjective Assessment - 09/20/20 1511    Subjective Rt knee is hurting a lot today - attribues it to cold; reports medication she is taking isn't helping    Patient is accompained by: Interpreter    Pertinent History PMH: LT TKA,HTN, DM, Rt knee OA,obesity    Limitations Walking;Standing    Diagnostic tests Rt knee XR 06/21/20 "X-rays of the right knee reveal moderate medial compartment DJD with joint space narrowing and periarticular spurring.  Mild lateral compartment degenerative change.  Mild to moderate patellofemoral spurring.  No signs of loose body, no stress fracture.  No sign of neoplasm"    Patient Stated Goals reduce pain    Currently in Pain? Yes    Pain Score 5     Pain Location Knee    Pain Orientation Right;Medial    Pain Descriptors / Indicators Aching    Pain Type Chronic pain    Pain Onset More than a month ago    Pain Frequency Constant    Aggravating Factors  standing and walking    Pain Relieving Factors rest                              [facility] Adult PT Treatment/Exercise - 09/20/20 1515      Knee/Hip Exercises: Stretches   Passive Hamstring Stretch Right;Left;3 reps;30 seconds    Passive Hamstring Stretch Limitations seated      Knee/Hip Exercises: Aerobic   Recumbent Bike L3 x 8 min      Knee/Hip Exercises: Machines for Strengthening   Cybex Knee Extension 10 lbs 3X10 reps bilat push    Cybex Knee Flexion 25 lbs 2X15 bilat pull      Knee/Hip Exercises: Standing   Other Standing Knee Exercises RDL 3x10, 10# KB      Electrical Stimulation   Electrical Stimulation Location Rt knee    Electrical Stimulation Action IFC    Electrical Stimulation Parameters to tolerance     Electrical Stimulation Goals Pain                  PT Education - 09/20/20 1542    Education Details TENS    Person(s) Educated Patient    Methods Explanation;Handout    Comprehension Verbalized understanding               PT Long  Term Goals - 09/04/20 1632      PT LONG TERM GOAL #1   Title Pt will be I and compliant with HEP.    Baseline has not been able to perform due to being sick with covid    Time 6    Period Weeks    Status On-going      PT LONG TERM GOAL #2   Title Pt will improve Rt knee AROM 0-130 deg to improve funciton.    Time 6    Period Weeks    Status On-going      PT LONG TERM GOAL #3   Title Pt will improve Rt hip/knee strength to overall 5-/5 to improve function.    Baseline 4/5    Time 6    Period Weeks    Status On-going      PT LONG TERM GOAL #4   Title Pt will report overall 50% less pain in her knee.    Baseline currently 7/10    Time 6    Period Weeks    Status On-going                 Plan - 09/20/20 1543    Clinical Impression Statement Pt tolerated session well today focusing on strengthening of bil LEs.  Will continue to benefit from PT to maximize function, all goals ongoing at this time.    Personal Factors and Comorbidities  Comorbidity 3+    Comorbidities PMH: LT TKA,HTN, DM, Rt knee OA,obesity    Examination-Activity Limitations Carry;Squat;Stairs;Stand;Lift;Locomotion Level    Examination-Participation Restrictions Community Activity;Shop;Occupation    Stability/Clinical Decision Making Evolving/Moderate complexity    Rehab Potential Good    PT Frequency 2x / week   1-2   PT Duration 6 weeks   4-6   PT Treatment/Interventions ADLs/Self Care Home Management;Cryotherapy;Electrical Stimulation;Iontophoresis 4mg /ml Dexamethasone;Moist Heat;Ultrasound;Therapeutic activities;Therapeutic exercise;Neuromuscular re-education;Manual techniques;Passive range of motion;Dry needling;Joint Manipulations;Taping;Vasopneumatic Device;Gait training    PT Next Visit Plan needs hip/knee stretching and strengthening, modalities PRN    PT Home Exercise Plan Access Code: [email or web address]    Consulted and Agree with Plan of Care Patient           Patient will benefit from skilled therapeutic intervention in order to improve the following deficits and impairments:  Abnormal gait, Decreased activity tolerance, Decreased endurance, Decreased range of motion, Decreased strength, Increased edema, Hypomobility, Difficulty walking, Increased muscle spasms, Increased fascial restricitons, Impaired flexibility, Pain, Obesity  Visit Diagnosis: Chronic pain of right knee  Muscle weakness (generalized)  Other abnormalities of gait and mobility  Localized edema     Problem List Patient Active Problem List   Diagnosis Date Noted  . Hypertension associated with diabetes ([place]) 03/15/2020  . Type 2 diabetes mellitus with hyperglycemia, without long-term current use of insulin ([place]) 03/15/2020  . Elevated liver enzymes 09/01/2019  . Heartburn 09/01/2019  . Hx of colonic polyps 09/01/2019  . Illiterate 09/01/2019  . Influenza vaccination declined 09/01/2019  . RUQ abdominal pain 09/01/2019  . Arthritis of knee 07/14/2019  . Encounter for  medical examination to establish care 07/14/2019  . Fatty liver 07/14/2019  . History of total left knee replacement 07/14/2019  . Mixed hyperlipidemia 07/14/2019  . Obesity (BMI 30-39.9) 07/14/2019      Laureen Abrahams, PT, DPT 09/20/20 3:48 PM    [facility] Physical Therapy [address] [place], [place], [address] Phone: [phone]   Fax:  [phone]  Name: Dawn Espinoza MRN: [id number] Date of Birth: 14-Apr-1961

## 2020-09-21 ENCOUNTER — Encounter: Payer: 59 | Admitting: Physical Therapy

## 2020-09-21 ENCOUNTER — Telehealth: Payer: Self-pay | Admitting: Physical Therapy

## 2020-09-21 NOTE — Telephone Encounter (Signed)
Pt no show for PT appointment today. PT had spanish interpreter contact them phone but no answer and unable to leave message.   Elsie Ra, PT, DPT 09/21/20 3:38 PM

## 2020-09-27 ENCOUNTER — Encounter: Payer: Self-pay | Admitting: Physical Therapy

## 2020-09-27 ENCOUNTER — Ambulatory Visit (INDEPENDENT_AMBULATORY_CARE_PROVIDER_SITE_OTHER): Payer: 59 | Admitting: Physical Therapy

## 2020-09-27 DIAGNOSIS — M25561 Pain in right knee: Secondary | ICD-10-CM | POA: Diagnosis not present

## 2020-09-27 DIAGNOSIS — R2689 Other abnormalities of gait and mobility: Secondary | ICD-10-CM

## 2020-09-27 DIAGNOSIS — M6281 Muscle weakness (generalized): Secondary | ICD-10-CM

## 2020-09-27 DIAGNOSIS — R600 Localized edema: Secondary | ICD-10-CM

## 2020-09-27 DIAGNOSIS — G8929 Other chronic pain: Secondary | ICD-10-CM

## 2020-09-27 NOTE — Therapy (Signed)
[facility] [address] [place], [place], [address] Phone: [phone]   Fax:  [phone]  Physical Therapy Treatment  Patient Details  Name: Dawn Espinoza MRN: [id number] Date of Birth: 17-Nov-1961 Referring Provider (PT): Eunice Blase, MD   Encounter Date: 09/27/2020   PT End of Session - 09/27/20 1557    Visit Number 4    Number of Visits 12    Date for PT Re-Evaluation 10/16/20    Authorization Type [employer]    PT Start Time 4268    PT Stop Time 1606    PT Time Calculation (min) 43 min    Activity Tolerance Patient tolerated treatment well    Behavior During Therapy [facility] for tasks assessed/performed           Past Medical History:  Diagnosis Date  . Hepatic steatosis   . HLD (hyperlipidemia)   . Hypertension   . Internal hemorrhoids     Past Surgical History:  Procedure Laterality Date  . NO PAST SURGERIES      There were no vitals filed for this visit.   Subjective Assessment - 09/27/20 1535    Subjective Relays overall the pain is less but she still has pain in her knees with prolonged standing.    Patient is accompained by: Interpreter    Pertinent History PMH: LT TKA,HTN, DM, Rt knee OA,obesity    Limitations Walking;Standing    Diagnostic tests Rt knee XR 06/21/20 "X-rays of the right knee reveal moderate medial compartment DJD with joint space narrowing and periarticular spurring.  Mild lateral compartment degenerative change.  Mild to moderate patellofemoral spurring.  No signs of loose body, no stress fracture.  No sign of neoplasm"    Patient Stated Goals reduce pain    Pain Onset More than a month ago              [facility] PT Assessment - 09/27/20 0001      Assessment   Medical Diagnosis Chronic Pain of Rt knee, Rt knee OA    Referring Provider (PT) Hilts, Michael, MD      Strength   Right Knee Flexion 4+/5    Right Knee Extension 4+/5    Left Knee Flexion 4+/5    Left Knee Extension 4+/5             [facility] Adult PT Treatment/Exercise - 09/27/20 0001      Knee/Hip Exercises: Stretches   Passive Hamstring Stretch Right;Left;3 reps;30 seconds    Passive Hamstring Stretch Limitations seated    Knee: Self-Stretch Limitations seated tailgate stretch 10 sec X 10 reps bilat    Gastroc Stretch Both;3 reps;30 seconds    Gastroc Stretch Limitations slantboard      Knee/Hip Exercises: Aerobic   Recumbent Bike L3 x 8 min      Knee/Hip Exercises: Machines for Strengthening   Cybex Knee Extension 10 lbs 3X10 reps bilat push    Cybex Knee Flexion 25 lbs 2X15 bilat pull    Total Gym Leg Press --      Knee/Hip Exercises: Standing   Other Standing Knee Exercises --      Knee/Hip Exercises: Seated   Sit to Sand 2 sets;10 reps;without UE support      Acupuncturist Location Rt knee    Electrical Stimulation Action IFC     Electrical Stimulation Parameters tolerance    Electrical Stimulation Goals Pain  PT Long Term Goals - 09/04/20 1632      PT LONG TERM GOAL #1   Title Pt will be I and compliant with HEP.    Baseline has not been able to perform due to being sick with covid    Time 6    Period Weeks    Status On-going      PT LONG TERM GOAL #2   Title Pt will improve Rt knee AROM 0-130 deg to improve funciton.    Time 6    Period Weeks    Status On-going      PT LONG TERM GOAL #3   Title Pt will improve Rt hip/knee strength to overall 5-/5 to improve function.    Baseline 4/5    Time 6    Period Weeks    Status On-going      PT LONG TERM GOAL #4   Title Pt will report overall 50% less pain in her knee.    Baseline currently 7/10    Time 6    Period Weeks    Status On-going                 Plan - 09/27/20 1559    Clinical Impression Statement Continued to work on strength and ROM as able. Overall showed improvments in strength with MMT. Pain has improved over last week. Continue POC     Personal Factors and Comorbidities Comorbidity 3+    Comorbidities PMH: LT TKA,HTN, DM, Rt knee OA,obesity    Examination-Activity Limitations Carry;Squat;Stairs;Stand;Lift;Locomotion Level    Examination-Participation Restrictions Community Activity;Shop;Occupation    Stability/Clinical Decision Making Evolving/Moderate complexity    Rehab Potential Good    PT Frequency 2x / week   1-2   PT Duration 6 weeks   4-6   PT Treatment/Interventions ADLs/Self Care Home Management;Cryotherapy;Electrical Stimulation;Iontophoresis 4mg /ml Dexamethasone;Moist Heat;Ultrasound;Therapeutic activities;Therapeutic exercise;Neuromuscular re-education;Manual techniques;Passive range of motion;Dry needling;Joint Manipulations;Taping;Vasopneumatic Device;Gait training    PT Next Visit Plan needs hip/knee stretching and strengthening, modalities PRN    PT Home Exercise Plan Access Code: [email or web address]    Consulted and Agree with Plan of Care Patient           Patient will benefit from skilled therapeutic intervention in order to improve the following deficits and impairments:  Abnormal gait, Decreased activity tolerance, Decreased endurance, Decreased range of motion, Decreased strength, Increased edema, Hypomobility, Difficulty walking, Increased muscle spasms, Increased fascial restricitons, Impaired flexibility, Pain, Obesity  Visit Diagnosis: Chronic pain of right knee  Muscle weakness (generalized)  Other abnormalities of gait and mobility  Localized edema     Problem List Patient Active Problem List   Diagnosis Date Noted  . Hypertension associated with diabetes ([place]) 03/15/2020  . Type 2 diabetes mellitus with hyperglycemia, without long-term current use of insulin ([place]) 03/15/2020  . Elevated liver enzymes 09/01/2019  . Heartburn 09/01/2019  . Hx of colonic polyps 09/01/2019  . Illiterate 09/01/2019  . Influenza vaccination declined 09/01/2019  . RUQ abdominal pain 09/01/2019  . Arthritis of  knee 07/14/2019  . Encounter for medical examination to establish care 07/14/2019  . Fatty liver 07/14/2019  . History of total left knee replacement 07/14/2019  . Mixed hyperlipidemia 07/14/2019  . Obesity (BMI 30-39.9) 07/14/2019    Debbe Odea ,PT,DPT 09/27/2020, 4:00 PM  [facility] Physical Therapy [address] [place], [place], [address] Phone: [phone]   Fax:  [phone]  Name: Dawn Espinoza MRN: [id number] Date of Birth: [birth year]

## 2020-09-28 ENCOUNTER — Encounter: Payer: 59 | Admitting: Physical Therapy

## 2020-10-02 ENCOUNTER — Encounter: Payer: Self-pay | Admitting: Physical Therapy

## 2020-10-02 ENCOUNTER — Other Ambulatory Visit: Payer: Self-pay

## 2020-10-02 ENCOUNTER — Ambulatory Visit (INDEPENDENT_AMBULATORY_CARE_PROVIDER_SITE_OTHER): Payer: 59 | Admitting: Physical Therapy

## 2020-10-02 DIAGNOSIS — R600 Localized edema: Secondary | ICD-10-CM

## 2020-10-02 DIAGNOSIS — M6281 Muscle weakness (generalized): Secondary | ICD-10-CM

## 2020-10-02 DIAGNOSIS — R2689 Other abnormalities of gait and mobility: Secondary | ICD-10-CM | POA: Diagnosis not present

## 2020-10-02 DIAGNOSIS — G8929 Other chronic pain: Secondary | ICD-10-CM

## 2020-10-02 DIAGNOSIS — M25561 Pain in right knee: Secondary | ICD-10-CM

## 2020-10-02 NOTE — Therapy (Signed)
[facility] Physical Therapy [address] [place], [place], [address] Phone: [phone]   Fax:  [phone]  Physical Therapy Treatment  Patient Details  Name: Dawn Espinoza MRN: [id number] Date of Birth: 08-31-1961 Referring Provider (PT): Eunice Blase, MD   Encounter Date: 10/02/2020   PT End of Session - 10/02/20 1554    Visit Number 5    Number of Visits 12    Date for PT Re-Evaluation 10/16/20    Authorization Type [employer]    PT Start Time 3664   pt arrived late   PT Stop Time 1554    PT Time Calculation (min) 33 min    Activity Tolerance Patient tolerated treatment well    Behavior During Therapy [facility] for tasks assessed/performed           Past Medical History:  Diagnosis Date  . Hepatic steatosis   . HLD (hyperlipidemia)   . Hypertension   . Internal hemorrhoids     Past Surgical History:  Procedure Laterality Date  . NO PAST SURGERIES      There were no vitals filed for this visit.   Subjective Assessment - 10/02/20 1524    Subjective Pain is a little better. said she had a flat tire last week and tried to call but no one answered.    Patient is accompained by: Interpreter    Pertinent History PMH: LT TKA,HTN, DM, Rt knee OA,obesity    Limitations Walking;Standing    Diagnostic tests Rt knee XR 06/21/20 "X-rays of the right knee reveal moderate medial compartment DJD with joint space narrowing and periarticular spurring.  Mild lateral compartment degenerative change.  Mild to moderate patellofemoral spurring.  No signs of loose body, no stress fracture.  No sign of neoplasm"    Patient Stated Goals reduce pain    Currently in Pain? Yes    Pain Score 4     Pain Location Knee    Pain Orientation Right;Medial    Pain Descriptors / Indicators Aching    Pain Type Chronic pain    Pain Onset More than a month ago    Pain Frequency Constant    Aggravating Factors  standing and walking    Pain Relieving Factors rest                              [facility] Adult PT Treatment/Exercise - 10/02/20 1525      Knee/Hip Exercises: Stretches   Passive Hamstring Stretch Right;Left;3 reps;30 seconds    Passive Hamstring Stretch Limitations seated    Knee: Self-Stretch Limitations seated tailgate stretch 10 sec X 10 reps bilat    Gastroc Stretch Both;3 reps;30 seconds    Gastroc Stretch Limitations slantboard      Knee/Hip Exercises: Aerobic   Nustep L7 x 10 min      Knee/Hip Exercises: Machines for Strengthening   Cybex Knee Extension 10 lbs 3X10 reps bilat push    Cybex Knee Flexion 25 lbs 3x10 bilat pull      Knee/Hip Exercises: Standing   Heel Raises Both;20 reps    Heel Raises Limitations on slantboard      Knee/Hip Exercises: Seated   Sit to Sand 2 sets;10 reps;without UE support                       PT Long Term Goals - 09/04/20 1632      PT LONG TERM GOAL #1  Title Pt will be I and compliant with HEP.    Baseline has not been able to perform due to being sick with covid    Time 6    Period Weeks    Status On-going      PT LONG TERM GOAL #2   Title Pt will improve Rt knee AROM 0-130 deg to improve funciton.    Time 6    Period Weeks    Status On-going      PT LONG TERM GOAL #3   Title Pt will improve Rt hip/knee strength to overall 5-/5 to improve function.    Baseline 4/5    Time 6    Period Weeks    Status On-going      PT LONG TERM GOAL #4   Title Pt will report overall 50% less pain in her knee.    Baseline currently 7/10    Time 6    Period Weeks    Status On-going                 Plan - 10/02/20 1554    Clinical Impression Statement Pt tolerated session well today reporting decreased pain after session.  Slowly progressing with PT.  Anticipate she may be ready for d/c next 1-2 weeks.    Personal Factors and Comorbidities Comorbidity 3+    Comorbidities PMH: LT TKA,HTN, DM, Rt knee OA,obesity    Examination-Activity Limitations  Carry;Squat;Stairs;Stand;Lift;Locomotion Level    Examination-Participation Restrictions Community Activity;Shop;Occupation    Stability/Clinical Decision Making Evolving/Moderate complexity    Rehab Potential Good    PT Frequency 2x / week   1-2   PT Duration 6 weeks   4-6   PT Treatment/Interventions ADLs/Self Care Home Management;Cryotherapy;Electrical Stimulation;Iontophoresis 4mg /ml Dexamethasone;Moist Heat;Ultrasound;Therapeutic activities;Therapeutic exercise;Neuromuscular re-education;Manual techniques;Passive range of motion;Dry needling;Joint Manipulations;Taping;Vasopneumatic Device;Gait training    PT Next Visit Plan needs hip/knee stretching and strengthening, modalities PRN, continue per POC    PT Home Exercise Plan Access Code: [email or web address]    Consulted and Agree with Plan of Care Patient           Patient will benefit from skilled therapeutic intervention in order to improve the following deficits and impairments:  Abnormal gait, Decreased activity tolerance, Decreased endurance, Decreased range of motion, Decreased strength, Increased edema, Hypomobility, Difficulty walking, Increased muscle spasms, Increased fascial restricitons, Impaired flexibility, Pain, Obesity  Visit Diagnosis: Chronic pain of right knee  Muscle weakness (generalized)  Other abnormalities of gait and mobility  Localized edema     Problem List Patient Active Problem List   Diagnosis Date Noted  . Hypertension associated with diabetes ([place]) 03/15/2020  . Type 2 diabetes mellitus with hyperglycemia, without long-term current use of insulin ([place]) 03/15/2020  . Elevated liver enzymes 09/01/2019  . Heartburn 09/01/2019  . Hx of colonic polyps 09/01/2019  . Illiterate 09/01/2019  . Influenza vaccination declined 09/01/2019  . RUQ abdominal pain 09/01/2019  . Arthritis of knee 07/14/2019  . Encounter for medical examination to establish care 07/14/2019  . Fatty liver 07/14/2019  . History of  total left knee replacement 07/14/2019  . Mixed hyperlipidemia 07/14/2019  . Obesity (BMI 30-39.9) 07/14/2019      Laureen Abrahams, PT, DPT 10/02/20 3:55 PM     [facility] [address] [place], [place], [address] Phone: [phone]   Fax:  [phone]  Name: Durenda Pechacek MRN: [id number] Date of Birth: 10-31-1961

## 2020-10-05 ENCOUNTER — Ambulatory Visit (INDEPENDENT_AMBULATORY_CARE_PROVIDER_SITE_OTHER): Payer: 59 | Admitting: Physical Therapy

## 2020-10-05 ENCOUNTER — Other Ambulatory Visit: Payer: Self-pay

## 2020-10-05 ENCOUNTER — Encounter: Payer: Self-pay | Admitting: Physical Therapy

## 2020-10-05 DIAGNOSIS — M6281 Muscle weakness (generalized): Secondary | ICD-10-CM | POA: Diagnosis not present

## 2020-10-05 DIAGNOSIS — M25561 Pain in right knee: Secondary | ICD-10-CM

## 2020-10-05 DIAGNOSIS — R600 Localized edema: Secondary | ICD-10-CM | POA: Diagnosis not present

## 2020-10-05 DIAGNOSIS — G8929 Other chronic pain: Secondary | ICD-10-CM

## 2020-10-05 DIAGNOSIS — R2689 Other abnormalities of gait and mobility: Secondary | ICD-10-CM | POA: Diagnosis not present

## 2020-10-05 NOTE — Therapy (Signed)
[facility] [address] [place], [place], [address] Phone: [phone]   Fax:  [phone]  Physical Therapy Treatment  Patient Details  Name: Dawn Espinoza MRN: [id number] Date of Birth: January 13, 1961 Referring Provider (PT): Eunice Blase, MD   Encounter Date: 10/05/2020   PT End of Session - 10/05/20 1549    Visit Number 6    Number of Visits 12    Date for PT Re-Evaluation 10/16/20    Authorization Type [employer]    PT Start Time 7062    PT Stop Time 1551    PT Time Calculation (min) 40 min    Activity Tolerance Patient tolerated treatment well    Behavior During Therapy [facility] for tasks assessed/performed           Past Medical History:  Diagnosis Date  . Hepatic steatosis   . HLD (hyperlipidemia)   . Hypertension   . Internal hemorrhoids     Past Surgical History:  Procedure Laterality Date  . NO PAST SURGERIES      There were no vitals filed for this visit.   Subjective Assessment - 10/05/20 1513    Subjective doing well; pain is about a 5/10 in the morning.  pain is better as she's up and moving    Patient is accompained by: Interpreter    Pertinent History PMH: LT TKA,HTN, DM, Rt knee OA,obesity    Limitations Walking;Standing    Diagnostic tests Rt knee XR 06/21/20 "X-rays of the right knee reveal moderate medial compartment DJD with joint space narrowing and periarticular spurring.  Mild lateral compartment degenerative change.  Mild to moderate patellofemoral spurring.  No signs of loose body, no stress fracture.  No sign of neoplasm"    Patient Stated Goals reduce pain    Currently in Pain? No/denies    Pain Score 4     Pain Orientation Right;Medial    Pain Descriptors / Indicators Aching    Pain Type Chronic pain    Pain Onset More than a month ago    Pain Frequency Constant    Aggravating Factors  standing and walking, worse in AM    Pain Relieving Factors rest, getting up/moving in the morning                              [facility] Adult PT Treatment/Exercise - 10/05/20 1514      Knee/Hip Exercises: Stretches   Passive Hamstring Stretch Right;3 reps;30 seconds    Passive Hamstring Stretch Limitations seated    Hip Flexor Stretch Right;3 reps;30 seconds    Hip Flexor Stretch Limitations seated with quad stretch    Gastroc Stretch Both;3 reps;30 seconds    Gastroc Stretch Limitations slantboard      Knee/Hip Exercises: Aerobic   Nustep L7 x 10 min      Knee/Hip Exercises: Machines for Strengthening   Cybex Knee Extension 10 lbs 3X10 reps bilat push    Cybex Knee Flexion 25 lbs 3x10 bilat pull    Total Gym Leg Press 100# 3x10; RLE only 50# 3x10      Knee/Hip Exercises: Standing   Heel Raises Both;20 reps    Heel Raises Limitations on slantboard    Other Standing Knee Exercises RLE deadlift 10# KB 2x10                       PT Long Term Goals - 09/04/20 3762  PT LONG TERM GOAL #1   Title Pt will be I and compliant with HEP.    Baseline has not been able to perform due to being sick with covid    Time 6    Period Weeks    Status On-going      PT LONG TERM GOAL #2   Title Pt will improve Rt knee AROM 0-130 deg to improve funciton.    Time 6    Period Weeks    Status On-going      PT LONG TERM GOAL #3   Title Pt will improve Rt hip/knee strength to overall 5-/5 to improve function.    Baseline 4/5    Time 6    Period Weeks    Status On-going      PT LONG TERM GOAL #4   Title Pt will report overall 50% less pain in her knee.    Baseline currently 7/10    Time 6    Period Weeks    Status On-going                 Plan - 10/05/20 1550    Clinical Impression Statement Pt tolerated strengthening exercises well today, needing min cues for technique with some activities.  She's doing well, and discussed current progress, and pt would like to d/c after next week.  She plans to see a specialist after PT.    Personal Factors and  Comorbidities Comorbidity 3+    Comorbidities PMH: LT TKA,HTN, DM, Rt knee OA,obesity    Examination-Activity Limitations Carry;Squat;Stairs;Stand;Lift;Locomotion Level    Examination-Participation Restrictions Community Activity;Shop;Occupation    Stability/Clinical Decision Making Evolving/Moderate complexity    Rehab Potential Good    PT Frequency 2x / week   1-2   PT Duration 6 weeks   4-6   PT Treatment/Interventions ADLs/Self Care Home Management;Cryotherapy;Electrical Stimulation;Iontophoresis 4mg /ml Dexamethasone;Moist Heat;Ultrasound;Therapeutic activities;Therapeutic exercise;Neuromuscular re-education;Manual techniques;Passive range of motion;Dry needling;Joint Manipulations;Taping;Vasopneumatic Device;Gait training    PT Next Visit Plan needs hip/knee stretching and strengthening, modalities PRN, continue per POC    PT Home Exercise Plan Access Code: [email or web address]    Consulted and Agree with Plan of Care Patient           Patient will benefit from skilled therapeutic intervention in order to improve the following deficits and impairments:  Abnormal gait, Decreased activity tolerance, Decreased endurance, Decreased range of motion, Decreased strength, Increased edema, Hypomobility, Difficulty walking, Increased muscle spasms, Increased fascial restricitons, Impaired flexibility, Pain, Obesity  Visit Diagnosis: Chronic pain of right knee  Muscle weakness (generalized)  Other abnormalities of gait and mobility  Localized edema     Problem List Patient Active Problem List   Diagnosis Date Noted  . Hypertension associated with diabetes ([place]) 03/15/2020  . Type 2 diabetes mellitus with hyperglycemia, without long-term current use of insulin ([place]) 03/15/2020  . Elevated liver enzymes 09/01/2019  . Heartburn 09/01/2019  . Hx of colonic polyps 09/01/2019  . Illiterate 09/01/2019  . Influenza vaccination declined 09/01/2019  . RUQ abdominal pain 09/01/2019  . Arthritis of knee  07/14/2019  . Encounter for medical examination to establish care 07/14/2019  . Fatty liver 07/14/2019  . History of total left knee replacement 07/14/2019  . Mixed hyperlipidemia 07/14/2019  . Obesity (BMI 30-39.9) 07/14/2019      Laureen Abrahams, PT, DPT 10/05/20 3:53 PM    [facility] Physical Therapy [address] [place], [place], [address] Phone: [phone]   Fax:  [phone]  Name: Dawn Espinoza  MRN: [id number] Date of Birth: Mar 15, 1961

## 2020-10-09 ENCOUNTER — Encounter: Payer: Self-pay | Admitting: Physical Therapy

## 2020-10-09 ENCOUNTER — Other Ambulatory Visit: Payer: Self-pay

## 2020-10-09 ENCOUNTER — Ambulatory Visit (INDEPENDENT_AMBULATORY_CARE_PROVIDER_SITE_OTHER): Payer: 59 | Admitting: Physical Therapy

## 2020-10-09 DIAGNOSIS — R2689 Other abnormalities of gait and mobility: Secondary | ICD-10-CM | POA: Diagnosis not present

## 2020-10-09 DIAGNOSIS — M25561 Pain in right knee: Secondary | ICD-10-CM

## 2020-10-09 DIAGNOSIS — R600 Localized edema: Secondary | ICD-10-CM | POA: Diagnosis not present

## 2020-10-09 DIAGNOSIS — M6281 Muscle weakness (generalized): Secondary | ICD-10-CM

## 2020-10-09 DIAGNOSIS — G8929 Other chronic pain: Secondary | ICD-10-CM

## 2020-10-09 NOTE — Therapy (Signed)
[facility] [address] [place], [place], [address] Phone: [phone]   Fax:  [phone]  Physical Therapy Treatment  Patient Details  Name: Dawn Espinoza MRN: [id number] Date of Birth: 1960-11-25 Referring Provider (PT): Eunice Blase, MD   Encounter Date: 10/09/2020   PT End of Session - 10/09/20 1547    Visit Number 7    Number of Visits 12    Date for PT Re-Evaluation 10/16/20    Authorization Type [employer]    PT Start Time 1510    PT Stop Time 1549    PT Time Calculation (min) 39 min    Activity Tolerance Patient tolerated treatment well    Behavior During Therapy [facility] for tasks assessed/performed           Past Medical History:  Diagnosis Date  . Hepatic steatosis   . HLD (hyperlipidemia)   . Hypertension   . Internal hemorrhoids     Past Surgical History:  Procedure Laterality Date  . NO PAST SURGERIES      There were no vitals filed for this visit.   Subjective Assessment - 10/09/20 1511    Subjective pain is around a 2/10 today.    Patient is accompained by: Interpreter    Pertinent History PMH: LT TKA,HTN, DM, Rt knee OA,obesity    Limitations Walking;Standing    Diagnostic tests Rt knee XR 06/21/20 "X-rays of the right knee reveal moderate medial compartment DJD with joint space narrowing and periarticular spurring.  Mild lateral compartment degenerative change.  Mild to moderate patellofemoral spurring.  No signs of loose body, no stress fracture.  No sign of neoplasm"    Patient Stated Goals reduce pain    Currently in Pain? Yes    Pain Score 2     Pain Location Knee    Pain Orientation Medial;Right    Pain Descriptors / Indicators Aching    Pain Type Chronic pain    Pain Onset More than a month ago    Pain Frequency Constant    Aggravating Factors  standing, walking, worse in AM    Pain Relieving Factors rest, getting up/moving                             [facility] Adult PT  Treatment/Exercise - 10/09/20 1513      Knee/Hip Exercises: Stretches   Passive Hamstring Stretch Right;Left;3 reps;30 seconds    Gastroc Stretch Both;3 reps;30 seconds    Gastroc Stretch Limitations slantboard      Knee/Hip Exercises: Aerobic   Recumbent Bike L3 x 8 min      Knee/Hip Exercises: Machines for Strengthening   Cybex Knee Extension 10 lbs 3X10 reps bilat push    Cybex Knee Flexion 25 lbs 3x10 bilat pull    Total Gym Leg Press 100# 3x10; RLE only 50# 3x10      Knee/Hip Exercises: Standing   Heel Raises Both;3 sets;10 reps    Heel Raises Limitations on slantboard    Hip Abduction Both;20 reps;Knee straight    Hip Extension Both;20 reps;Knee straight    Functional Squat 3 sets;10 reps      Knee/Hip Exercises: Seated   Sit to Sand 10 reps;without UE support;2 sets                       PT Long Term Goals - 09/04/20 1632      PT LONG TERM GOAL #1  Title Pt will be I and compliant with HEP.    Baseline has not been able to perform due to being sick with covid    Time 6    Period Weeks    Status On-going      PT LONG TERM GOAL #2   Title Pt will improve Rt knee AROM 0-130 deg to improve funciton.    Time 6    Period Weeks    Status On-going      PT LONG TERM GOAL #3   Title Pt will improve Rt hip/knee strength to overall 5-/5 to improve function.    Baseline 4/5    Time 6    Period Weeks    Status On-going      PT LONG TERM GOAL #4   Title Pt will report overall 50% less pain in her knee.    Baseline currently 7/10    Time 6    Period Weeks    Status On-going                 Plan - 10/09/20 1548    Clinical Impression Statement Pt tolerated session well today with focus on strengthening bil LEs.  Plan to d/c next visit per pt's request.    Personal Factors and Comorbidities Comorbidity 3+    Comorbidities PMH: LT TKA,HTN, DM, Rt knee OA,obesity    Examination-Activity Limitations Carry;Squat;Stairs;Stand;Lift;Locomotion Level      Examination-Participation Restrictions Community Activity;Shop;Occupation    Stability/Clinical Decision Making Evolving/Moderate complexity    Rehab Potential Good    PT Frequency 2x / week   1-2   PT Duration 6 weeks   4-6   PT Treatment/Interventions ADLs/Self Care Home Management;Cryotherapy;Electrical Stimulation;Iontophoresis 4mg /ml Dexamethasone;Moist Heat;Ultrasound;Therapeutic activities;Therapeutic exercise;Neuromuscular re-education;Manual techniques;Passive range of motion;Dry needling;Joint Manipulations;Taping;Vasopneumatic Device;Gait training    PT Next Visit Plan check goals, d/c PT    PT Home Exercise Plan Access Code: [email or web address]    Consulted and Agree with Plan of Care Patient           Patient will benefit from skilled therapeutic intervention in order to improve the following deficits and impairments:  Abnormal gait, Decreased activity tolerance, Decreased endurance, Decreased range of motion, Decreased strength, Increased edema, Hypomobility, Difficulty walking, Increased muscle spasms, Increased fascial restricitons, Impaired flexibility, Pain, Obesity  Visit Diagnosis: Chronic pain of right knee  Muscle weakness (generalized)  Other abnormalities of gait and mobility  Localized edema     Problem List Patient Active Problem List   Diagnosis Date Noted  . Hypertension associated with diabetes ([place]) 03/15/2020  . Type 2 diabetes mellitus with hyperglycemia, without long-term current use of insulin ([place]) 03/15/2020  . Elevated liver enzymes 09/01/2019  . Heartburn 09/01/2019  . Hx of colonic polyps 09/01/2019  . Illiterate 09/01/2019  . Influenza vaccination declined 09/01/2019  . RUQ abdominal pain 09/01/2019  . Arthritis of knee 07/14/2019  . Encounter for medical examination to establish care 07/14/2019  . Fatty liver 07/14/2019  . History of total left knee replacement 07/14/2019  . Mixed hyperlipidemia 07/14/2019  . Obesity (BMI 30-39.9)  07/14/2019      Laureen Abrahams, PT, DPT 10/09/20 3:50 PM    [facility] [address] [place], [place], [address] Phone: [phone]   Fax:  [phone]  Name: Landa Mullinax MRN: [id number] Date of Birth: 1961-11-06

## 2020-10-11 ENCOUNTER — Encounter: Payer: Self-pay | Admitting: Physical Therapy

## 2020-10-11 ENCOUNTER — Other Ambulatory Visit: Payer: Self-pay

## 2020-10-11 ENCOUNTER — Ambulatory Visit (INDEPENDENT_AMBULATORY_CARE_PROVIDER_SITE_OTHER): Payer: 59 | Admitting: Physical Therapy

## 2020-10-11 DIAGNOSIS — M25561 Pain in right knee: Secondary | ICD-10-CM | POA: Diagnosis not present

## 2020-10-11 DIAGNOSIS — M6281 Muscle weakness (generalized): Secondary | ICD-10-CM

## 2020-10-11 DIAGNOSIS — R600 Localized edema: Secondary | ICD-10-CM | POA: Diagnosis not present

## 2020-10-11 DIAGNOSIS — R2689 Other abnormalities of gait and mobility: Secondary | ICD-10-CM | POA: Diagnosis not present

## 2020-10-11 DIAGNOSIS — G8929 Other chronic pain: Secondary | ICD-10-CM

## 2020-10-11 NOTE — Therapy (Signed)
[facility] [address] [place], [place], [address] Phone: [phone]   Fax:  [phone]  Physical Therapy Treatment/Discharge PHYSICAL THERAPY DISCHARGE SUMMARY  Visits from Start of Care: 8  Current functional level related to goals / functional outcomes: See below   Remaining deficits: See below   Education / Equipment: HEP  Plan: Patient agrees to discharge.  Patient goals were not met. Patient is being discharged due to the patient's request.  ?????       Patient Details  Name: Dawn Espinoza MRN: [id number] Date of Birth: [birth year] Referring Provider (PT): Eunice Blase, MD   Encounter Date: 10/11/2020   PT End of Session - 10/11/20 1633    Visit Number 8    Number of Visits 12    Date for PT Re-Evaluation 10/16/20    Authorization Type [employer]    PT Start Time 1553    PT Stop Time 1631    PT Time Calculation (min) 38 min    Activity Tolerance Patient tolerated treatment well    Behavior During Therapy [facility] for tasks assessed/performed           Past Medical History:  Diagnosis Date  . Hepatic steatosis   . HLD (hyperlipidemia)   . Hypertension   . Internal hemorrhoids     Past Surgical History:  Procedure Laterality Date  . NO PAST SURGERIES      There were no vitals filed for this visit.   Subjective Assessment - 10/11/20 1603    Subjective pain is about 3/10 overall. She requests this is her last visit.    Patient is accompained by: Interpreter    Pertinent History PMH: LT TKA,HTN, DM, Rt knee OA,obesity    Limitations Walking;Standing    Diagnostic tests Rt knee XR 06/21/20 "X-rays of the right knee reveal moderate medial compartment DJD with joint space narrowing and periarticular spurring.  Mild lateral compartment degenerative change.  Mild to moderate patellofemoral spurring.  No signs of loose body, no stress fracture.  No sign of neoplasm"    Patient Stated Goals reduce pain    Pain Onset  More than a month ago              [facility] PT Assessment - 10/11/20 0001      Assessment   Medical Diagnosis Chronic Pain of Rt knee, Rt knee OA    Referring Provider (PT) Hilts, Michael, MD      AROM   Right/Left Knee Right;Left    Right Knee Extension -2    Right Knee Flexion 125    Left Knee Extension -1    Left Knee Flexion 125      Strength   Right Knee Flexion 4+/5    Right Knee Extension 4+/5    Left Knee Flexion 4+/5    Left Knee Extension 4+/5                         [facility] Adult PT Treatment/Exercise - 10/11/20 0001      Knee/Hip Exercises: Stretches   Passive Hamstring Stretch Right;Left;30 seconds;2 reps    Gastroc Stretch Both;3 reps;30 seconds    Gastroc Stretch Limitations slantboard      Knee/Hip Exercises: Aerobic   Nustep [id number]      Knee/Hip Exercises: Machines for Strengthening   Cybex Knee Extension 10 lbs 3X10 reps bilat push    Cybex Knee Flexion 25 lbs 3x10 bilat pull    Total Gym Leg  Press 106# 3x10;       Knee/Hip Exercises: Standing   Hip Abduction Both;20 reps;Knee straight    Hip Extension Both;20 reps;Knee straight                       PT Long Term Goals - 10/11/20 1635      PT LONG TERM GOAL #1   Title Pt will be I and compliant with HEP.    Baseline has not been able to perform due to being sick with covid    Time 6    Period Weeks    Status Achieved      PT LONG TERM GOAL #2   Title Pt will improve Rt knee AROM 0-130 deg to improve funciton.    Baseline 1-125    Time 6    Period Weeks    Status Partially Met      PT LONG TERM GOAL #3   Title Pt will improve Rt hip/knee strength to overall 5-/5 to improve function.    Baseline 4+/5    Time 6    Period Weeks    Status Partially Met      PT LONG TERM GOAL #4   Title Pt will report overall 50% less pain in her knee.    Baseline was 7 now about 2-3 on avg    Time 6    Period Weeks    Status Achieved                 Plan -  10/11/20 1634    Clinical Impression Statement She feels ready for discharge, did not fully meet all PT goals but was close and she requests to discharge.    Personal Factors and Comorbidities Comorbidity 3+    Comorbidities PMH: LT TKA,HTN, DM, Rt knee OA,obesity    Examination-Activity Limitations Carry;Squat;Stairs;Stand;Lift;Locomotion Level    Examination-Participation Restrictions Community Activity;Shop;Occupation    Stability/Clinical Decision Making Evolving/Moderate complexity    Rehab Potential Good    PT Frequency 2x / week   1-2   PT Duration 6 weeks   4-6   PT Treatment/Interventions ADLs/Self Care Home Management;Cryotherapy;Electrical Stimulation;Iontophoresis 66m/ml Dexamethasone;Moist Heat;Ultrasound;Therapeutic activities;Therapeutic exercise;Neuromuscular re-education;Manual techniques;Passive range of motion;Dry needling;Joint Manipulations;Taping;Vasopneumatic Device;Gait training    PT Next Visit Plan DC today    PT Home Exercise Plan Access Code: [email or web address]   Consulted and Agree with Plan of Care Patient           Patient will benefit from skilled therapeutic intervention in order to improve the following deficits and impairments:  Abnormal gait, Decreased activity tolerance, Decreased endurance, Decreased range of motion, Decreased strength, Increased edema, Hypomobility, Difficulty walking, Increased muscle spasms, Increased fascial restricitons, Impaired flexibility, Pain, Obesity  Visit Diagnosis: Chronic pain of right knee  Muscle weakness (generalized)  Other abnormalities of gait and mobility  Localized edema     Problem List Patient Active Problem List   Diagnosis Date Noted  . Hypertension associated with diabetes ([place] 03/15/2020  . Type 2 diabetes mellitus with hyperglycemia, without long-term current use of insulin ([place] 03/15/2020  . Elevated liver enzymes 09/01/2019  . Heartburn 09/01/2019  . Hx of colonic polyps 09/01/2019  . Illiterate  09/01/2019  . Influenza vaccination declined 09/01/2019  . RUQ abdominal pain 09/01/2019  . Arthritis of knee 07/14/2019  . Encounter for medical examination to establish care 07/14/2019  . Fatty liver 07/14/2019  . History of total left knee replacement 07/14/2019  . Mixed hyperlipidemia  07/14/2019  . Obesity (BMI 30-39.9) 07/14/2019    Silvestre Mesi 10/11/2020, 4:36 PM  [facility] [address] [place], [place], [address] Phone: [phone]   Fax:  [phone]  Name: Dawn Espinoza MRN: [id number] Date of Birth: [birth year]

## 2020-10-20 ENCOUNTER — Ambulatory Visit
Admission: RE | Admit: 2020-10-20 | Discharge: 2020-10-20 | Disposition: A | Discharge status: Home / Self Care | Payer: 59 | Source: Ambulatory Visit | Attending: Emergency Medicine | Admitting: Emergency Medicine

## 2020-10-20 ENCOUNTER — Other Ambulatory Visit: Payer: Self-pay

## 2020-10-20 DIAGNOSIS — N6320 Unspecified lump in the left breast, unspecified quadrant: Secondary | ICD-10-CM

## 2020-10-31 ENCOUNTER — Other Ambulatory Visit: Payer: Self-pay | Admitting: Emergency Medicine

## 2020-10-31 DIAGNOSIS — N6002 Solitary cyst of left breast: Secondary | ICD-10-CM

## 2020-11-15 ENCOUNTER — Telehealth: Payer: Self-pay | Admitting: *Deleted

## 2020-11-15 NOTE — Telephone Encounter (Signed)
Faxed signed order to [facility]. Confirmation page  4:43 pm.

## 2020-12-13 ENCOUNTER — Ambulatory Visit: Payer: 59 | Admitting: Emergency Medicine

## 2020-12-14 ENCOUNTER — Encounter: Payer: Self-pay | Admitting: Emergency Medicine

## 2021-01-07 IMAGING — MG DIGITAL SCREENING BILAT W/ CAD
6 series · 6 of 6 positions shown · non-contrast
Comparison: Previous exam(s).

CLINICAL DATA: Screening.

EXAM:
DIGITAL SCREENING BILATERAL MAMMOGRAM WITH CAD

[L CC (1 of 2)]
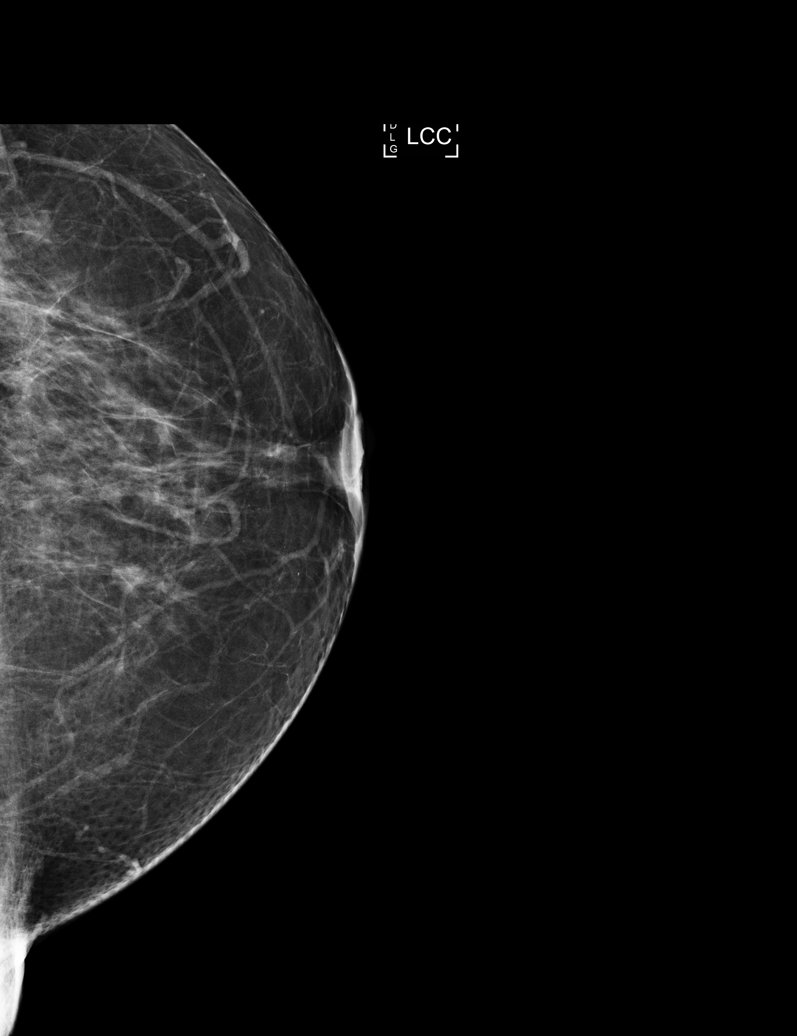

[L MLO (1 of 2)]
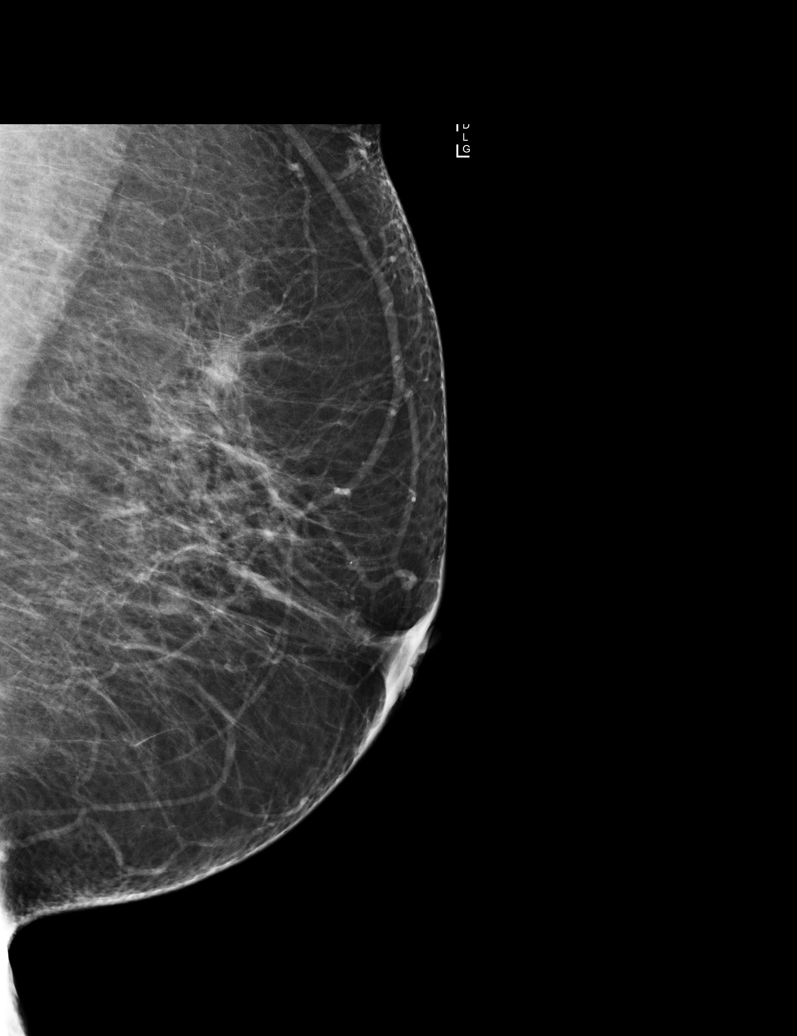

[R MLO]
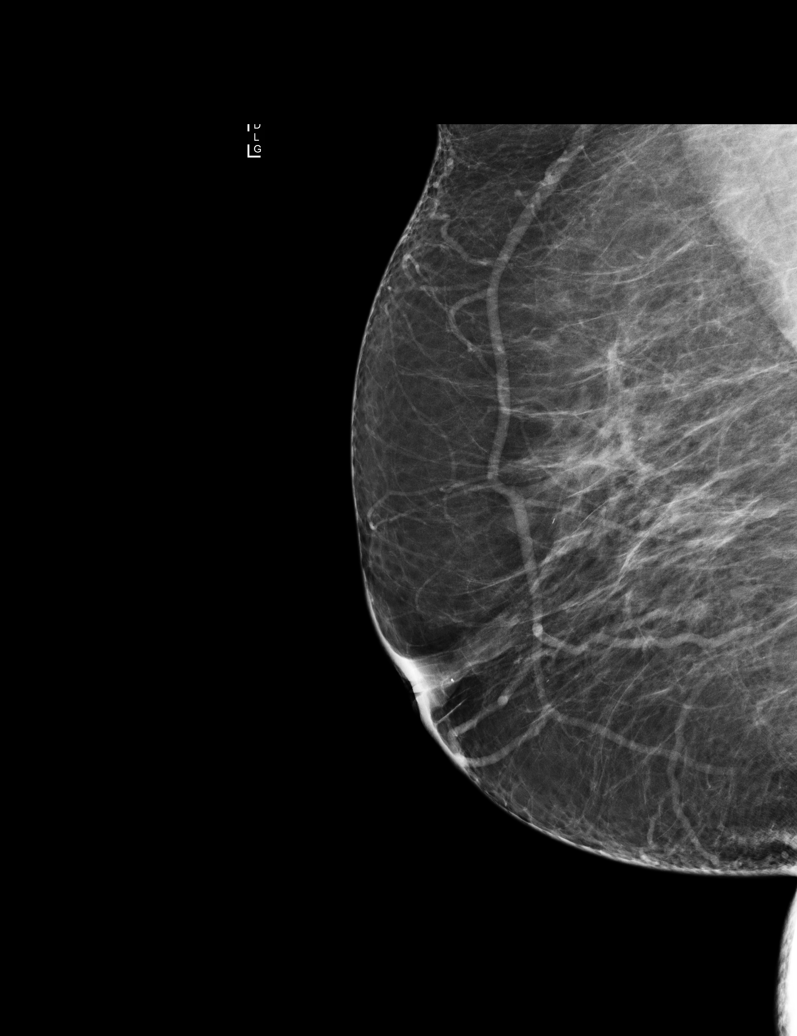

[L MLO (2 of 2)]
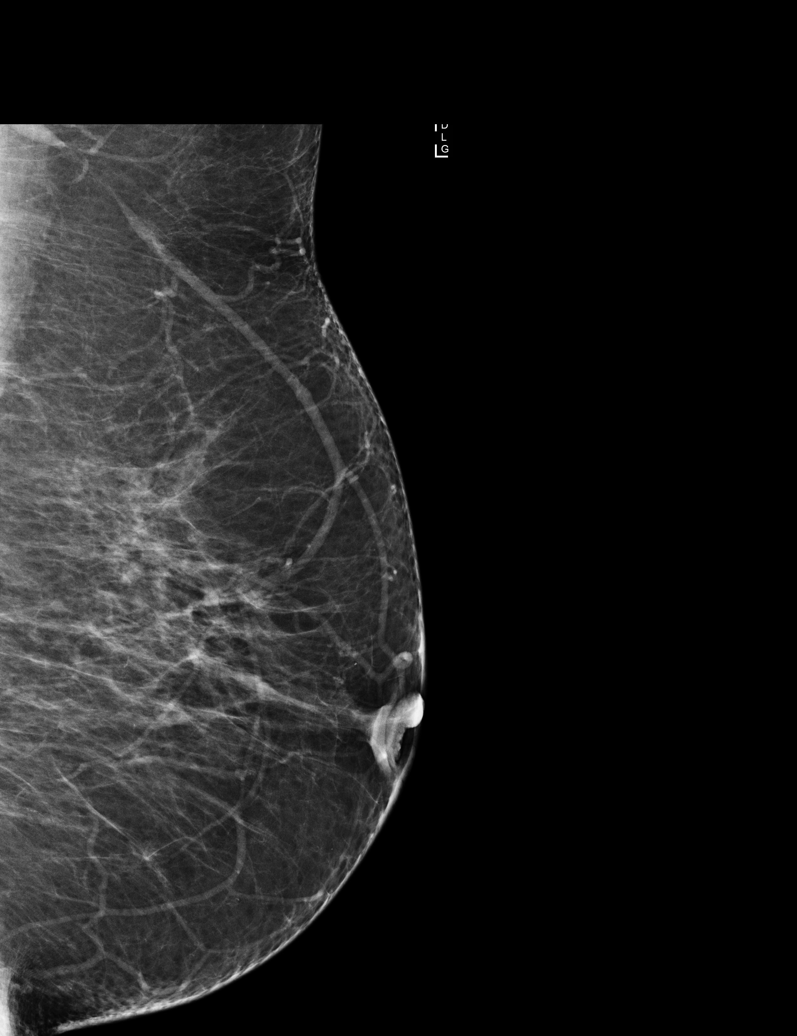

[R CC]
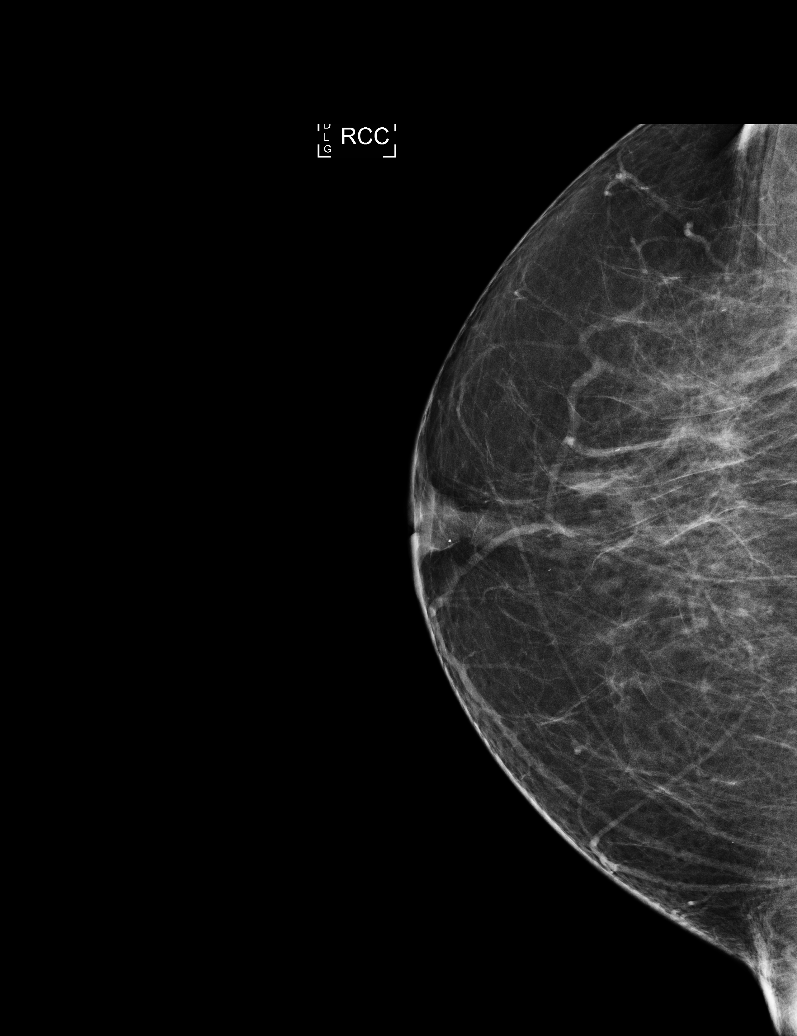

[L CC (2 of 2)]
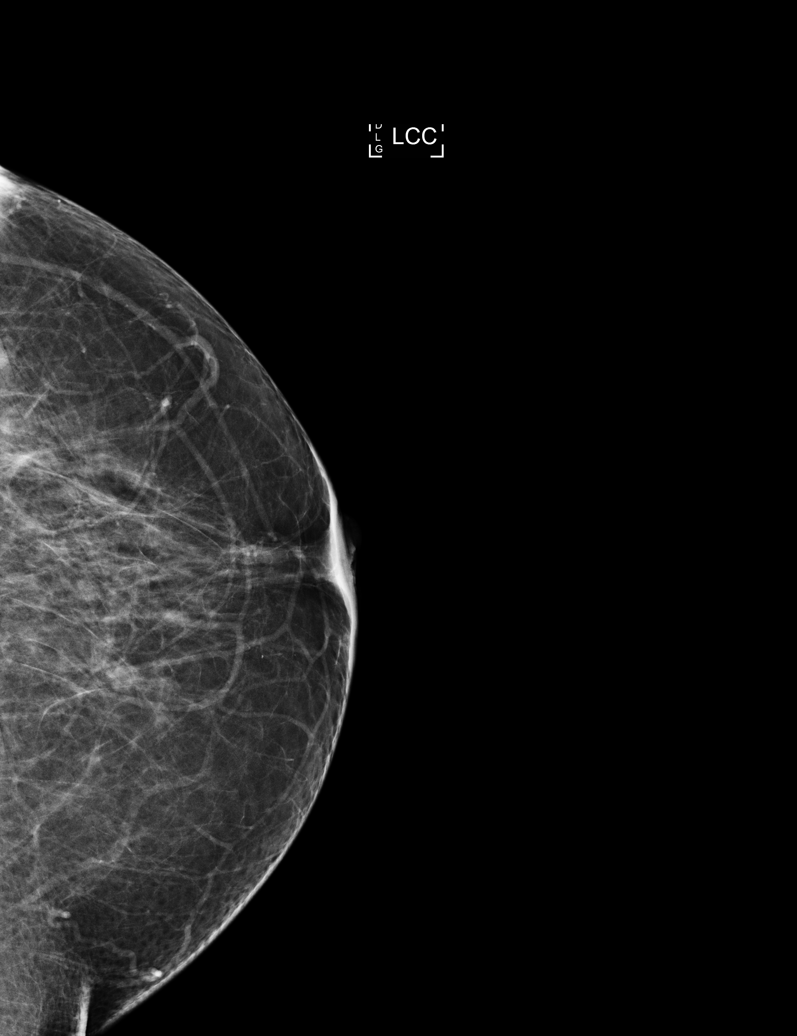

[6 of 6 positions shown; findings below may reference images not displayed]

ACR Breast Density Category b: There are scattered areas of
fibroglandular density.
FINDINGS: In the left breast, a possible mass warrants further evaluation. In
the right breast, no findings suspicious for malignancy.

Images were processed with CAD.
IMPRESSION: Further evaluation is suggested for possible mass in the left
breast.

RECOMMENDATION:
Diagnostic mammogram and possibly ultrasound of the left breast.
(Code:5M-3-PPT)

The patient will be contacted regarding the findings, and additional
imaging will be scheduled.

BI-RADS CATEGORY  0: Incomplete. Need additional imaging evaluation
and/or prior mammograms for comparison.

## 2021-03-20 ENCOUNTER — Encounter: Payer: Self-pay | Admitting: Emergency Medicine

## 2021-03-20 ENCOUNTER — Ambulatory Visit (INDEPENDENT_AMBULATORY_CARE_PROVIDER_SITE_OTHER): Payer: 59 | Admitting: Emergency Medicine

## 2021-03-20 ENCOUNTER — Other Ambulatory Visit: Payer: Self-pay

## 2021-03-20 VITALS — BP 120/78 | HR 81 | Temp 99.0°F | Ht 61.0 in | Wt 168.6 lb

## 2021-03-20 DIAGNOSIS — E1169 Type 2 diabetes mellitus with other specified complication: Secondary | ICD-10-CM | POA: Diagnosis not present

## 2021-03-20 DIAGNOSIS — E785 Hyperlipidemia, unspecified: Secondary | ICD-10-CM

## 2021-03-20 DIAGNOSIS — E1159 Type 2 diabetes mellitus with other circulatory complications: Secondary | ICD-10-CM

## 2021-03-20 DIAGNOSIS — K760 Fatty (change of) liver, not elsewhere classified: Secondary | ICD-10-CM | POA: Diagnosis not present

## 2021-03-20 DIAGNOSIS — I152 Hypertension secondary to endocrine disorders: Secondary | ICD-10-CM

## 2021-03-20 MED ORDER — AMLODIPINE BESYLATE 5 MG PO TABS: 5.0000 mg | ORAL_TABLET | Freq: Every day | ORAL | 3 refills | Status: DC

## 2021-03-20 MED ORDER — GLIPIZIDE 5 MG PO TABS
5.0000 mg | ORAL_TABLET | Freq: Every day | ORAL | 3 refills | Status: DC
Start: 2021-03-20 — End: 2022-04-22

## 2021-03-20 MED ORDER — ATORVASTATIN CALCIUM 20 MG PO TABS: 20.0000 mg | ORAL_TABLET | Freq: Every day | ORAL | 3 refills | Status: DC

## 2021-03-20 NOTE — Progress Notes (Signed)
Lab Results  Component Value Date   HGBA1C 6.5 (H) 09/11/2020   BP Readings from Last 3 Encounters:  09/11/20 131/75  08/17/20 100/69  06/08/20 (!) 138/60   The 10-year ASCVD risk score Dawn Bussing DC Jr., et al., 2013) is: 6.6%   Values used to calculate the score:     Age: 60 years     Sex: Female     Is Non-Hispanic African American: No     Diabetic: Yes     Tobacco smoker: No     Systolic Blood Pressure: 161 mmHg     Is BP treated: Yes     HDL Cholesterol: 52 mg/dL     Total Cholesterol: 131 mg/dL Lab Results  Component Value Date   CREATININE 0.78 03/15/2020   BUN 13 03/15/2020   NA 140 03/15/2020   K 4.2 03/15/2020   CL 105 03/15/2020   CO2 20 03/15/2020   [place] 60 y.o.   Chief Complaint  Patient presents with  . Hypertension  . Diabetes    Discuss blood sugar  . Medication Refill    pend    HISTORY OF PRESENT ILLNESS: This is a 60 y.o. female with history of diabetes and hypertension here for follow-up. 1.  Hypertension: On amlodipine 5 mg daily 2.  Diabetes: Has been off medication for the past 2 months usually states pharmacy will not refill her prescription. Presently taking glipizide 5 mg twice a day.  Intolerant to metformin. 3.  Dyslipidemia: On atorvastatin 20 mg daily. No complaints or medical concerns today.  HPI   Prior to Admission medications   Medication Sig Start Date End Date Taking? Authorizing Provider  amLODipine (NORVASC) 5 MG tablet Take 1 tablet (5 mg total) by mouth daily. 06/08/20  Yes Warrick Llera, Ines Bloomer, MD  atorvastatin (LIPITOR) 20 MG tablet Take 1 tablet (20 mg total) by mouth daily. 06/08/20  Yes Ayeisha Lindenberger, Ines Bloomer, MD  Cholecalciferol (VITAMIN D-3) 125 MCG (5000 UT) TABS Take 1 tablet by mouth daily. Patient not taking: Reported on 03/20/2021 06/21/20   Hilts, Legrand Como, MD  glipiZIDE (GLUCOTROL) 5 MG tablet Take 1 tablet (5 mg total) by mouth daily with breakfast. Patient not taking: Reported on 09/11/2020 06/08/20  09/11/20  Horald Pollen, MD  Glucosamine Sulfate 1000 MG CAPS Take 1 capsule (1,000 mg total) by mouth 2 (two) times daily. Patient not taking: Reported on 03/20/2021 06/21/20   Hilts, Legrand Como, MD  HYDROcodone-acetaminophen (NORCO/VICODIN) 5-325 MG tablet Take 1-2 tablets by mouth every 6 (six) hours as needed. Patient not taking: Reported on 03/20/2021 08/17/20   Loura Halt A, NP  Menatetrenone (VITAMIN K2) 100 MCG TABS Take 100 mcg by mouth daily. Patient not taking: Reported on 03/20/2021 06/21/20   Hilts, Legrand Como, MD  nabumetone (RELAFEN) 500 MG tablet Take 1 tablet (500 mg total) by mouth 2 (two) times daily as needed. Patient not taking: Reported on 03/20/2021 06/21/20   Hilts, Legrand Como, MD  ondansetron (ZOFRAN ODT) 4 MG disintegrating tablet Take 1 tablet (4 mg total) by mouth every 8 (eight) hours as needed for nausea or vomiting. Patient not taking: Reported on 03/20/2021 08/17/20   Orvan July, NP    No Known Allergies  Patient Active Problem List   Diagnosis Date Noted  . Hypertension associated with diabetes ([place]) 03/15/2020  . Dyslipidemia associated with type 2 diabetes mellitus ([place]) 03/15/2020  . Elevated liver enzymes 09/01/2019  . Hx of colonic polyps 09/01/2019  . Influenza vaccination declined 09/01/2019  .  Arthritis of knee 07/14/2019  . Fatty liver 07/14/2019  . History of total left knee replacement 07/14/2019  . Mixed hyperlipidemia 07/14/2019  . Obesity (BMI 30-39.9) 07/14/2019    Past Medical History:  Diagnosis Date  . Hepatic steatosis   . HLD (hyperlipidemia)   . Hypertension   . Internal hemorrhoids     Past Surgical History:  Procedure Laterality Date  . NO PAST SURGERIES      Social History   Socioeconomic History  . Marital status: Single    Spouse name: Not on file  . Number of children: 6  . Years of education: Not on file  . Highest education level: Not on file  Occupational History  . Not on file  Tobacco Use  . Smoking status: Never  Smoker  . Smokeless tobacco: Never Used  Substance and Sexual Activity  . Alcohol use: No    Alcohol/week: 0.0 standard drinks  . Drug use: No  . Sexual activity: Never  Other Topics Concern  . Not on file  Social History Narrative   ** Merged History Encounter **       Social Determinants of Health   Financial Resource Strain: Not on file  Food Insecurity: Not on file  Transportation Needs: Not on file  Physical Activity: Not on file  Stress: Not on file  Social Connections: Not on file  Intimate Partner Violence: Not on file    Family History  Problem Relation Age of Onset  . Hypertension Mother   . Alcohol abuse Father   . Heart disease Father      Review of Systems  Constitutional: Negative.   HENT: Negative.  Negative for congestion and sore throat.   Respiratory: Negative.  Negative for cough and shortness of breath.   Cardiovascular: Negative.  Negative for chest pain and palpitations.  Gastrointestinal: Negative for abdominal pain, diarrhea, nausea and vomiting.  Genitourinary: Positive for frequency. Negative for hematuria.  Skin: Negative.  Negative for rash.  Neurological: Negative.  Negative for dizziness and headaches.  All other systems reviewed and are negative.   Today's Vitals   03/20/21 1524  BP: 120/78  Pulse: 81  Temp: 99 F (37.2 C)  TempSrc: Oral  SpO2: 97%  Weight: 168 lb 9.6 oz (76.5 kg)  Height: 5\' 1"  (1.549 m)   Body mass index is 31.86 kg/m.  Physical Exam Vitals reviewed.  Constitutional:      Appearance: Normal appearance.  HENT:     Head: Normocephalic.     Mouth/Throat:     Mouth: Mucous membranes are moist.     Pharynx: Oropharynx is clear.  Eyes:     Extraocular Movements: Extraocular movements intact.     Conjunctiva/sclera: Conjunctivae normal.     Pupils: Pupils are equal, round, and reactive to light.  Cardiovascular:     Rate and Rhythm: Normal rate and regular rhythm.     Pulses: Normal pulses.     Heart  sounds: Normal heart sounds.  Pulmonary:     Effort: Pulmonary effort is normal.     Breath sounds: Normal breath sounds.  Musculoskeletal:        General: Normal range of motion.     Cervical back: Normal range of motion and neck supple.  Skin:    General: Skin is warm and dry.     Capillary Refill: Capillary refill takes less than 2 seconds.  Neurological:     General: No focal deficit present.     Mental Status:  She is alert and oriented to person, place, and time.  Psychiatric:        Mood and Affect: Mood normal.        Behavior: Behavior normal.      ASSESSMENT & PLAN: A total of 30 minutes was spent with the patient and counseling/coordination of care regarding diabetes and hypertension and cardiovascular risks associated with these conditions, review of all medications and need to take them daily, education on nutrition, review of most recent office visit notes, review of most recent blood work results, prognosis, documentation, and need for follow-up.  Hypertension associated with diabetes ([place]) Well-controlled hypertension.  Continue amlodipine 5 mg daily. Blood work done today.  Expecting A1c to be elevated. Patient has been off glipizide for 2 months. Diet and nutrition discussed. Continue atorvastatin 20 mg daily. Follow-up in 3 months.   Dawn Espinoza was seen today for hypertension, diabetes and medication refill.  Diagnoses and all orders for this visit:  Hypertension associated with diabetes ([place]) -     amLODipine (NORVASC) 5 MG tablet; Take 1 tablet (5 mg total) by mouth daily. -     Hemoglobin A1c -     Comprehensive metabolic panel  Fatty liver  Dyslipidemia associated with type 2 diabetes mellitus ([facility]) -     atorvastatin (LIPITOR) 20 MG tablet; Take 1 tablet (20 mg total) by mouth daily.  Type 2 diabetes mellitus with other specified complication, without long-term current use of insulin ([facility]) -     glipiZIDE (GLUCOTROL) 5 MG tablet; Take 1 tablet (5 mg  total) by mouth daily with breakfast.    Patient Instructions   Diabetes mellitus y nutricin, en adultos Diabetes Mellitus and Nutrition, Adult Si sufre de diabetes, o diabetes mellitus, es muy importante tener hbitos alimenticios saludables debido a que sus niveles de Designer, television/film set sangre (glucosa) se ven afectados en gran medida por lo que come y bebe. Comer alimentos saludables en las cantidades correctas, aproximadamente a la misma hora todos los [place], [place] ayudar a:  Aeronautical engineer glucemia.  Disminuir el riesgo de sufrir una enfermedad cardaca.  Mejorar la presin arterial.  Science writer o mantener un peso saludable. Qu puede afectar mi plan de alimentacin? Todas las personas que sufren de diabetes son diferentes y cada una tiene necesidades diferentes en cuanto a un plan de alimentacin. El mdico puede recomendarle que trabaje con un nutricionista para elaborar el mejor plan para usted. Su plan de alimentacin puede variar segn factores como:  Las caloras que necesita.  Los medicamentos que toma.  Su peso.  Sus niveles de glucemia, presin arterial y colesterol.  Su nivel de [place].  Otras afecciones que tenga, como enfermedades cardacas o renales. Cmo me afectan los carbohidratos? Los carbohidratos, o hidratos de carbono, afectan su nivel de glucemia ms que cualquier otro tipo de alimento. La ingesta de carbohidratos naturalmente aumenta la cantidad de [employer]. El recuento de carbohidratos es un mtodo destinado a Catering manager un registro de la cantidad de carbohidratos que se consumen. El recuento de carbohidratos es importante para Theatre manager la glucemia a un nivel saludable, especialmente si utiliza insulina o toma determinados medicamentos por va oral para la diabetes. Es importante conocer la cantidad de carbohidratos que se pueden ingerir en cada comida sin correr Engineer, manufacturing. Esto es Psychologist, forensic. Su nutricionista puede ayudarlo a calcular  la cantidad de carbohidratos que debe ingerir en cada comida y en cada refrigerio. Cmo me afecta el alcohol? El alcohol puede  provocar disminuciones sbitas de la glucemia (hipoglucemia), especialmente si utiliza insulina o toma determinados medicamentos por va oral para la diabetes. La hipoglucemia es una afeccin potencialmente mortal. Los sntomas de la hipoglucemia, como somnolencia, mareos y confusin, son similares a los sntomas de haber consumido demasiado alcohol.  No beba alcohol si: ? Su mdico le indica no hacerlo. ? Est embarazada, puede estar embarazada o est tratando de quedar embarazada.  Si bebe alcohol: ? No beba con el estmago vaco. ? Limite la cantidad que bebe:  De 0 a 1 medida por da para las mujeres.  De 0 a 2 medidas por da para los hombres. ? Est atento a la cantidad de alcohol que hay en las bebidas que toma. En los [place], una medida equivale a una botella de cerveza de 12oz (351ml), un vaso de vino de 5oz (157ml) o un vaso de una bebida alcohlica de alta graduacin de 1oz (25ml). ? Mantngase hidratado bebiendo agua, refrescos dietticos o t helado sin azcar.  Tenga en cuenta que los refrescos comunes, los jugos y otras bebida para Optician, dispensing pueden contener mucha azcar y se deben contar como carbohidratos. Consejos para seguir Photographer las etiquetas de los alimentos  Comience por leer el tamao de la porcin en la "Informacin nutricional" en las etiquetas de los alimentos envasados y las bebidas. La cantidad de caloras, carbohidratos, grasas y otros nutrientes mencionados en la etiqueta se basan en una porcin del alimento. Muchos alimentos contienen ms de una porcin por envase.  Verifique la cantidad total de gramos (g) de carbohidratos totales en una porcin. Puede calcular la cantidad de porciones de carbohidratos al dividir el total de carbohidratos por 15. Por ejemplo, si un alimento tiene un total de 30g de carbohidratos  totales por porcin, equivale a 2 porciones de carbohidratos.  Verifique la cantidad de gramos (g) de grasas saturadas y grasas trans de una porcin. Escoja alimentos que no contengan estas grasas o que su contenido de estas sea [place].  Verifique la cantidad de miligramos (mg) de sal (sodio) en una porcin. La mayora de las personas deben limitar la ingesta de sodio total a menos de 2300mg  por Training and development officer.  Siempre consulte la informacin nutricional de los alimentos etiquetados como "con bajo contenido de grasa" o "sin grasa". Estos alimentos pueden tener un mayor contenido de Location manager agregada o carbohidratos refinados, y deben evitarse.  Hable con su nutricionista para identificar sus objetivos diarios en cuanto a los nutrientes mencionados en la etiqueta. Al ir de compras  Evite comprar alimentos procesados, enlatados o precocidos. Estos alimentos tienden a Special educational needs teacher mayor cantidad de [place], sodio y azcar agregada.  Compre en la zona exterior de la tienda de comestibles. Esta es la zona donde se encuentran con mayor frecuencia las frutas y las verduras frescas, los cereales a granel, las carnes frescas y los productos lcteos frescos. Al cocinar  Utilice mtodos de coccin a baja temperatura, como hornear, en lugar de mtodos de coccin a alta temperatura, como frer en abundante aceite.  Cocine con aceites saludables, como el aceite de [place], canola o [place].  Evite cocinar con manteca, crema o carnes con alto contenido de grasa. Planificacin de las comidas  Coma las comidas y los refrigerios regularmente, preferentemente a la misma hora todos [place]. Evite pasar largos perodos de tiempo sin comer.  Consuma alimentos ricos en fibra, como frutas frescas, verduras, frijoles y cereales integrales. Consulte a su nutricionista sobre cuntas porciones de carbohidratos puede consumir en  cada comida.  Consuma entre 4 y 6 onzas (entre 112 y 168g) de protenas magras por da, como carnes magras,  pollo, pescado, huevos o tofu. Una onza (oz) de protena magra equivale a: ? 1 onza (28g) de carne, pollo o pescado. ? 1huevo. ?  de taza (62 g) de tofu.  Coma algunos alimentos por da que contengan grasas saludables, como aguacates, frutos secos, semillas y pescado.   Qu alimentos debo comer? Lambert Mody Bayas. Manzanas. Naranjas. Duraznos. Damascos. Ciruelas. Uvas. Mango. Papaya. [place]. Kiwi. Cerezas. [place] Valeda Malm. [place]. Verduras de [employer], que incluyen col rizada, [place], hojas de [place] y de [place]. Remolachas. Coliflor. Repollo. Brcoli. Zanahorias. Judas verdes. Tomates. Pimientos. Cebollas. Pepinos. Coles de Bruselas. Granos Granos integrales, como panes, galletas, tortillas, cereales y pastas de salvado o integrales. Avena sin azcar. Quinua. Arroz integral o salvaje. Carnes y Psychiatric nurse. Carne de ave sin piel. Cortes magros de ave y carne de res. Tofu. Frutos secos. Semillas. Lcteos Productos lcteos sin grasa o con bajo contenido de [place], [place], yogur y [place]. Es posible que los productos que se enumeran ms [place] no constituyan una lista completa de los alimentos y las bebidas que puede tomar. Consulte a un nutricionista para obtener ms informacin. Qu alimentos debo evitar? Lambert Mody Frutas enlatadas al almbar. Verduras Verduras enlatadas. Verduras congeladas con mantequilla o salsa de crema. Granos Productos elaborados con [place] y [place], como panes, pastas, bocadillos y cereales. Evite todos los alimentos procesados. Carnes y otras protenas Cortes de carne con alto contenido de Lobbyist. Carne de ave con piel. Carnes empanizadas o fritas. Carne procesada. Evite las grasas saturadas. Lcteos Yogur, Stella enteros. Bebidas Bebidas azucaradas, como gaseosas o t helado. Es posible que los productos que se enumeran ms [place] no constituyan una lista completa de los alimentos y las bebidas que Nurse, adult. Consulte  a un nutricionista para obtener ms informacin. Preguntas para hacerle al mdico  Es necesario que me rena con Radio broadcast assistant en el cuidado de la diabetes?  Es necesario que me rena con un nutricionista?  A qu nmero puedo llamar si tengo preguntas?  Cules son los mejores momentos para controlar la glucemia? Dnde encontrar ms informacin:  Asociacin Estadounidense de la Diabetes (American Diabetes Association): diabetes.org  Academy of Nutrition and Dietetics (Academia de Nutricin y Information systems manager): www.eatright.[employer] of Diabetes and Digestive and Kidney Diseases ([place] la Diabetes y [place] y [place]): DesMoinesFuneral.dk  Association of Diabetes Care and Education Specialists (Asociacin de Especialistas en Atencin y Educacin sobre la Diabetes): www.diabeteseducator.org Resumen  Es importante tener hbitos alimenticios saludables debido a que sus niveles de Designer, television/film set sangre (glucosa) se ven afectados en gran medida por lo que come y bebe.  Un plan de alimentacin saludable lo ayudar a controlar la glucemia y Theatre manager un estilo de vida saludable.  El mdico puede recomendarle que trabaje con un nutricionista para elaborar el mejor plan para usted.  Tenga en cuenta que los carbohidratos (hidratos de carbono) y el alcohol tienen efectos inmediatos en sus niveles de glucemia. Es importante contar los carbohidratos que ingiere y consumir alcohol con prudencia. Esta informacin no tiene Marine scientist el consejo del mdico. Asegrese de hacerle al mdico cualquier pregunta que tenga. Document Revised: 12/09/2019 Document Reviewed: 12/09/2019 Elsevier Patient Education  2021 [place], MD [facility]

## 2021-03-20 NOTE — Patient Instructions (Signed)
Diabetes mellitus y nutricin, en adultos Diabetes Mellitus and Nutrition, Adult Si sufre de diabetes, o diabetes mellitus, es muy importante tener hbitos alimenticios saludables debido a que sus niveles de Designer, television/film set sangre (glucosa) se ven afectados en gran medida por lo que come y bebe. [place] alimentos saludables en las cantidades correctas, aproximadamente a la misma hora todos los [place], [place] ayudar a:  Aeronautical engineer glucemia.  Disminuir el riesgo de sufrir una enfermedad cardaca.  Mejorar la presin arterial.  Science writer o mantener un peso saludable. Qu puede afectar mi plan de alimentacin? Todas las personas que sufren de diabetes son diferentes y cada una tiene necesidades diferentes en cuanto a un plan de alimentacin. El mdico puede recomendarle que trabaje con un nutricionista para elaborar el mejor plan para usted. Su plan de alimentacin puede variar segn factores como:  Las caloras que necesita.  Los medicamentos que toma.  Su peso.  Sus niveles de glucemia, presin arterial y colesterol.  Su nivel de [place].  Otras afecciones que tenga, como enfermedades cardacas o renales. Cmo me afectan los carbohidratos? Los carbohidratos, o hidratos de carbono, afectan su nivel de glucemia ms que cualquier otro tipo de alimento. La ingesta de carbohidratos naturalmente aumenta la cantidad de [employer]. El recuento de carbohidratos es un mtodo destinado a Catering manager un registro de la cantidad de carbohidratos que se consumen. El recuento de carbohidratos es importante para Theatre manager la glucemia a un nivel saludable, especialmente si utiliza insulina o toma determinados medicamentos por va oral para la diabetes. Es importante conocer la cantidad de carbohidratos que se pueden ingerir en cada comida sin correr Engineer, manufacturing. Esto es Psychologist, forensic. Su nutricionista puede ayudarlo a calcular la cantidad de carbohidratos que debe ingerir en cada comida y en cada  refrigerio. Cmo me afecta el alcohol? El alcohol puede provocar disminuciones sbitas de la glucemia (hipoglucemia), especialmente si utiliza insulina o toma determinados medicamentos por va oral para la diabetes. La hipoglucemia es una afeccin potencialmente mortal. Los sntomas de la hipoglucemia, como somnolencia, mareos y confusin, son similares a los sntomas de haber consumido demasiado alcohol.  No beba alcohol si: ? Su mdico le indica no hacerlo. ? Est embarazada, puede estar embarazada o est tratando de quedar embarazada.  Si bebe alcohol: ? No beba con el estmago vaco. ? Limite la cantidad que bebe:  De 0 a 1 medida por da para las mujeres.  De 0 a 2 medidas por da para los hombres. ? Est atento a la cantidad de alcohol que hay en las bebidas que toma. En los [place], una medida equivale a una botella de cerveza de 12oz (368m), un vaso de vino de 5oz (1468m o un vaso de una bebida alcohlica de alta graduacin de 1oz (4485m ? Mantngase hidratado bebiendo agua, refrescos dietticos o t helado sin azcar.  Tenga en cuenta que los refrescos comunes, los jugos y otras bebida para mezOptician, dispensingeden contener mucha azcar y se deben contar como carbohidratos. Consejos para seguir estPhotographers etiquetas de los alimentos  Comience por leer el tamao de la porcin en la "Informacin nutricional" en las etiquetas de los alimentos envasados y las bebidas. La cantidad de caloras, carbohidratos, grasas y otros nutrientes mencionados en la etiqueta se basan en una porcin del alimento. Muchos alimentos contienen ms de una porcin por envase.  Verifique la cantidad total de gramos (g) de carbohidratos totales en una porcin. Puede calcular la cantidad de porciones  de carbohidratos al dividir el total de carbohidratos por 15. Por ejemplo, si un alimento tiene un total de 30g de carbohidratos totales por porcin, equivale a 2 porciones de  carbohidratos.  Verifique la cantidad de gramos (g) de grasas saturadas y grasas trans de una porcin. Escoja alimentos que no contengan estas grasas o que su contenido de estas sea [place].  Verifique la cantidad de miligramos (mg) de sal (sodio) en una porcin. [place] [employer] de las personas deben limitar la ingesta de sodio total a menos de 2333m por dTraining and development officer  Siempre consulte la informacin nutricional de los alimentos etiquetados como "con bajo contenido de grasa" o "sin grasa". Estos alimentos pueden tener un mayor contenido de aLocation manageragregada o carbohidratos refinados, y deben evitarse.  Hable con su nutricionista para identificar sus objetivos diarios en cuanto a los nutrientes mencionados en la etiqueta. Al ir de compras  Evite comprar alimentos procesados, enlatados o precocidos. Estos alimentos tienden a tSpecial educational needs teachermayor cantidad de gF[place] sodio y azcar agregada.  Compre en la zona exterior de la tienda de comestibles. Esta es la zona donde se encuentran con mayor frecuencia las frutas y las verduras frescas, los cereales a granel, las carnes frescas y los productos lcteos frescos. Al cocinar  Utilice mtodos de coccin a baja temperatura, como hornear, en lugar de mtodos de coccin a alta temperatura, como frer en abundante aceite.  Cocine con aceites saludables, como el aceite de oFrontier canola o gPiketon  Evite cocinar con manteca, crema o carnes con alto contenido de grasa. Planificacin de las comidas  Coma las comidas y los refrigerios regularmente, preferentemente a la misma hora todos lKnight[place] Evite pasar largos perodos de tiempo sin comer.  Consuma alimentos ricos en fibra, como frutas frescas, verduras, frijoles y cereales integrales. Consulte a su nutricionista sobre cuntas porciones de carbohidratos puede consumir en cada comida.  Consuma entre 4 y 6 onzas (entre 112 y 168g) de protenas magras por da, como carnes magras, pollo, pescado, huevos o tofu. Una onza (oz) de  protena magra equivale a: ? 1 onza (28g) de carne, pollo o pescado. ? 1huevo. ?  de taza (62 g) de tofu.  Coma algunos alimentos por da que contengan grasas saludables, como aguacates, frutos secos, semillas y pescado.   Qu alimentos debo comer? FLambert ModyBayas. Manzanas. Naranjas. Duraznos. Damascos. Ciruelas. Uvas. Mango. Papaya. GTolna Kiwi. Cerezas. VHolland CommonsLValeda Malm Espinaca. Verduras de hBoeing que incluyen col rizada, aFolsom hojas de bIraqy de mFountain Remolachas. Coliflor. Repollo. Brcoli. Zanahorias. Judas verdes. Tomates. Pimientos. Cebollas. Pepinos. Coles de Bruselas. Granos Granos integrales, como panes, galletas, tortillas, cereales y pastas de salvado o integrales. Avena sin azcar. Quinua. Arroz integral o salvaje. Carnes y oPsychiatric nurse Carne de ave sin piel. Cortes magros de ave y carne de res. Tofu. Frutos secos. Semillas. Lcteos Productos lcteos sin grasa o con bajo contenido de gPepin cUpper Stewartsville yogur y qKahaluu-Keauhou Es posible que los productos que se enumeran ms aNew Caledoniano constituyan una lista completa de los alimentos y las bebidas que puede tomar. Consulte a un nutricionista para obtener ms informacin. Qu alimentos debo evitar? FLambert ModyFrutas enlatadas al almbar. Verduras Verduras enlatadas. Verduras congeladas con mantequilla o salsa de crema. Granos Productos elaborados con hIsraely hLao People's Democratic Republic como panes, pastas, bocadillos y cereales. Evite todos los alimentos procesados. Carnes y otras protenas Cortes de carne con alto contenido de gLobbyist Carne de ave con piel. Carnes empanizadas o fritas. Carne procesada. Evite las grasas saturadas.  los alimentos y las bebidas que debe evitar. Consulte a un nutricionista para obtener ms informacin. Preguntas para hacerle al mdico Es necesario que me rena con un instructor en el cuidado  de la diabetes? Es necesario que me rena con un nutricionista? A qu nmero puedo llamar si tengo preguntas? Cules son los mejores momentos para controlar la glucemia? Dnde encontrar ms informacin: Asociacin Estadounidense de la Diabetes (American Diabetes Association): diabetes.org Academy of Nutrition and Dietetics (Academia de Nutricin y Diettica): www.eatright.org National Institute of Diabetes and Digestive and Kidney Diseases (Instituto Nacional de la Diabetes y las Enfermedades Digestivas y Renales): www.niddk.nih.gov Association of Diabetes Care and Education Specialists (Asociacin de Especialistas en Atencin y Educacin sobre la Diabetes): www.diabeteseducator.org Resumen Es importante tener hbitos alimenticios saludables debido a que sus niveles de azcar en la sangre (glucosa) se ven afectados en gran medida por lo que come y bebe. Un plan de alimentacin saludable lo ayudar a controlar la glucemia y mantener un estilo de vida saludable. El mdico puede recomendarle que trabaje con un nutricionista para elaborar el mejor plan para usted. Tenga en cuenta que los carbohidratos (hidratos de carbono) y el alcohol tienen efectos inmediatos en sus niveles de glucemia. Es importante contar los carbohidratos que ingiere y consumir alcohol con prudencia. Esta informacin no tiene como fin reemplazar el consejo del mdico. Asegrese de hacerle al mdico cualquier pregunta que tenga. Document Revised: 12/09/2019 Document Reviewed: 12/09/2019 Elsevier Patient Education  2021 Elsevier Inc.  

## 2021-03-20 NOTE — Assessment & Plan Note (Addendum)
Well-controlled hypertension.  Continue amlodipine 5 mg daily. Blood work done today.  Expecting A1c to be elevated. Patient has been off glipizide for 2 months. Diet and nutrition discussed. Continue atorvastatin 20 mg daily. Follow-up in 3 months.

## 2021-03-21 LAB — COMPREHENSIVE METABOLIC PANEL
ALT: 49 U/L — ABNORMAL HIGH (ref 0–35)
AST: 38 U/L — ABNORMAL HIGH (ref 0–37)
Albumin: 4.4 g/dL (ref 3.5–5.2)
Alkaline Phosphatase: 83 U/L (ref 39–117)
BUN: 17 mg/dL (ref 6–23)
CO2: 28 mEq/L (ref 19–32)
Calcium: 9.7 mg/dL (ref 8.4–10.5)
Chloride: 101 mEq/L (ref 96–112)
Creatinine, Ser: 0.84 mg/dL (ref 0.40–1.20)
GFR: 75.74 mL/min (ref >60.00)
Glucose, Bld: 96 mg/dL (ref 70–99)
Potassium: 4.1 mEq/L (ref 3.5–5.1)
Sodium: 137 mEq/L (ref 135–145)
Total Bilirubin: 0.8 mg/dL (ref 0.2–1.2)
Total Protein: 7.5 g/dL (ref 6.0–8.3)

## 2021-03-21 LAB — HEMOGLOBIN A1C: Hgb A1c MFr Bld: 6.4 % (ref 4.6–6.5)

## 2021-04-13 ENCOUNTER — Ambulatory Visit
Admission: RE | Admit: 2021-04-13 | Discharge: 2021-04-13 | Disposition: A | Discharge status: Home / Self Care | Payer: 59 | Source: Ambulatory Visit | Attending: Emergency Medicine | Admitting: Emergency Medicine

## 2021-04-13 ENCOUNTER — Other Ambulatory Visit: Payer: Self-pay

## 2021-04-13 ENCOUNTER — Ambulatory Visit: Payer: 59

## 2021-04-13 DIAGNOSIS — N6002 Solitary cyst of left breast: Secondary | ICD-10-CM

## 2021-06-20 ENCOUNTER — Encounter: Payer: Self-pay | Admitting: Emergency Medicine

## 2021-06-20 ENCOUNTER — Other Ambulatory Visit: Payer: Self-pay

## 2021-06-20 ENCOUNTER — Ambulatory Visit (INDEPENDENT_AMBULATORY_CARE_PROVIDER_SITE_OTHER): Payer: 59 | Admitting: Emergency Medicine

## 2021-06-20 VITALS — BP 126/60 | HR 72 | Temp 98.8°F | Ht 61.0 in | Wt 172.0 lb

## 2021-06-20 DIAGNOSIS — E1169 Type 2 diabetes mellitus with other specified complication: Secondary | ICD-10-CM

## 2021-06-20 DIAGNOSIS — E785 Hyperlipidemia, unspecified: Secondary | ICD-10-CM

## 2021-06-20 DIAGNOSIS — I152 Hypertension secondary to endocrine disorders: Secondary | ICD-10-CM

## 2021-06-20 DIAGNOSIS — E1159 Type 2 diabetes mellitus with other circulatory complications: Secondary | ICD-10-CM | POA: Diagnosis not present

## 2021-06-20 NOTE — Progress Notes (Signed)
Dawn Espinoza 60 y.o.   Chief Complaint  Patient presents with   Follow-up    HISTORY OF PRESENT ILLNESS: This is a 60 y.o. female with history of prediabetes, hypertension, dyslipidemia here for follow-up. Doing well.  Has no complaints or medical concerns today #1 dyslipidemia: On atorvastatin 20 mg daily #2 hypertension on amlodipine 5 mg daily #3 diabetes: On glipizide 5 mg in the morning Last hemoglobin A1c was 6.4 off medications.  HPI   Prior to Admission medications   Medication Sig Start Date End Date Taking? Authorizing Provider  amLODipine (NORVASC) 5 MG tablet Take 1 tablet (5 mg total) by mouth daily. 03/20/21  Yes Dawt Reeb, Ines Bloomer, MD  atorvastatin (LIPITOR) 20 MG tablet Take 1 tablet (20 mg total) by mouth daily. 03/20/21  Yes Alexsus Papadopoulos, Ines Bloomer, MD  Cholecalciferol (VITAMIN D-3) 125 MCG (5000 UT) TABS Take 1 tablet by mouth daily. Patient not taking: No sig reported 06/21/20   Hilts, Legrand Como, MD  glipiZIDE (GLUCOTROL) 5 MG tablet Take 1 tablet (5 mg total) by mouth daily with breakfast. 03/20/21 06/18/21  Horald Pollen, MD  Glucosamine Sulfate 1000 MG CAPS Take 1 capsule (1,000 mg total) by mouth 2 (two) times daily. Patient not taking: No sig reported 06/21/20   Hilts, Michael, MD  HYDROcodone-acetaminophen (NORCO/VICODIN) 5-325 MG tablet Take 1-2 tablets by mouth every 6 (six) hours as needed. Patient not taking: No sig reported 08/17/20   Loura Halt A, NP  Menatetrenone (VITAMIN K2) 100 MCG TABS Take 100 mcg by mouth daily. Patient not taking: No sig reported 06/21/20   Hilts, Michael, MD  nabumetone (RELAFEN) 500 MG tablet Take 1 tablet (500 mg total) by mouth 2 (two) times daily as needed. Patient not taking: No sig reported 06/21/20   Hilts, Legrand Como, MD  ondansetron (ZOFRAN ODT) 4 MG disintegrating tablet Take 1 tablet (4 mg total) by mouth every 8 (eight) hours as needed for nausea or vomiting. Patient not taking: No sig reported 08/17/20   Loura Halt A, NP     No Known Allergies  Patient Active Problem List   Diagnosis Date Noted   Hypertension associated with diabetes ([place]) 03/15/2020   Dyslipidemia associated with type 2 diabetes mellitus ([place]) 03/15/2020   Elevated liver enzymes 09/01/2019   Hx of colonic polyps 09/01/2019   Influenza vaccination declined 09/01/2019   Arthritis of knee 07/14/2019   Fatty liver 07/14/2019   History of total left knee replacement 07/14/2019   Mixed hyperlipidemia 07/14/2019   Obesity (BMI 30-39.9) 07/14/2019    Past Medical History:  Diagnosis Date   Hepatic steatosis    HLD (hyperlipidemia)    Hypertension    Internal hemorrhoids     Past Surgical History:  Procedure Laterality Date   NO PAST SURGERIES      Social History   Socioeconomic History   Marital status: Single    Spouse name: Not on file   Number of children: 6   Years of education: Not on file   Highest education level: Not on file  Occupational History   Not on file  Tobacco Use   Smoking status: Never   Smokeless tobacco: Never  Substance and Sexual Activity   Alcohol use: No    Alcohol/week: 0.0 standard drinks   Drug use: No   Sexual activity: Never  Other Topics Concern   Not on file  Social History Narrative   ** Merged History Encounter **       Social Determinants of Health  Financial Resource Strain: Not on file  Food Insecurity: Not on file  Transportation Needs: Not on file  Physical Activity: Not on file  Stress: Not on file  Social Connections: Not on file  Intimate Partner Violence: Not on file    Family History  Problem Relation Age of Onset   Hypertension Mother    Alcohol abuse Father    Heart disease Father      Review of Systems  Constitutional: Negative.  Negative for chills and fever.  HENT: Negative.  Negative for congestion and sore throat.   Respiratory: Negative.  Negative for cough and shortness of breath.   Cardiovascular: Negative.  Negative for chest pain and  palpitations.  Genitourinary: Negative.  Negative for dysuria.  Musculoskeletal: Negative.   Skin: Negative.  Negative for rash.  Neurological:  Negative for dizziness and headaches.  All other systems reviewed and are negative. Today's Vitals   06/20/21 1509  BP: 126/60  Pulse: 72  Temp: 98.8 F (37.1 C)  TempSrc: Oral  SpO2: 98%  Weight: 172 lb (78 kg)  Height: '5\' 1"'$  (1.549 m)   Body mass index is 32.5 kg/m.   Physical Exam Vitals reviewed.  Constitutional:      Appearance: Normal appearance.  HENT:     Head: Normocephalic.  Eyes:     Extraocular Movements: Extraocular movements intact.     Pupils: Pupils are equal, round, and reactive to light.  Cardiovascular:     Rate and Rhythm: Normal rate and regular rhythm.     Pulses: Normal pulses.     Heart sounds: Normal heart sounds.  Pulmonary:     Effort: Pulmonary effort is normal.     Breath sounds: Normal breath sounds.  Musculoskeletal:        General: Normal range of motion.     Cervical back: Normal range of motion and neck supple.  Skin:    General: Skin is warm and dry.     Capillary Refill: Capillary refill takes less than 2 seconds.  Neurological:     General: No focal deficit present.     Mental Status: She is alert and oriented to person, place, and time.  Psychiatric:        Mood and Affect: Mood normal.        Behavior: Behavior normal.     ASSESSMENT & PLAN: A total of 31 minutes was spent with the patient and counseling/coordination of care regarding preparing for this visit, review of most recent office visit notes, review of most recent blood work results done last May, review of all medications and changes made, cardiovascular risks associated with hypertension diabetes and dyslipidemia, education on nutrition, prognosis, documentation, and need for follow-up in 3 months.  Hypertension associated with diabetes ([place]) Well-controlled hypertension.  Continue amlodipine 5 mg  daily. Well-controlled diabetes.  Probably no need for once daily glipizide.  Advised to stop it.  Diet and nutrition discussed.  Need to decrease amount of daily carbohydrate intake.  Follow-up in 3 months.  Dyslipidemia associated with type 2 diabetes mellitus ([place]) Diet and nutrition discussed.  Continue atorvastatin 20 mg daily. Follow-up in 3 months.  Digna was seen today for follow-up.  Diagnoses and all orders for this visit:  Hypertension associated with diabetes ([place])  Dyslipidemia associated with type 2 diabetes mellitus ([place])  Patient Instructions  Diabetes mellitus y nutricin, en adultos Diabetes Mellitus and Nutrition, Adult Si sufre de diabetes, o diabetes mellitus, es muy importante tener hbitos alimenticios saludables debido  a que sus niveles de Dispensing optician (glucosa) se ven afectados en gran medida por lo que come y bebe. [place] alimentos saludables en las cantidades correctas, aproximadamente a la misma hora todos los [place], [place] ayudar a: Aeronautical engineer glucemia. Disminuir el riesgo de sufrir una enfermedad cardaca. Mejorar la presin arterial. Science writer o mantener un peso saludable. Qu puede afectar mi plan de alimentacin? Todas las personas que sufren de diabetes son diferentes y cada una tiene necesidades diferentes en cuanto a un plan de alimentacin. El mdico puede recomendarle que trabaje con un nutricionista para elaborar el mejor plan para usted. Su plan de alimentacin puede variar segn factores como: Las caloras que necesita. Los medicamentos que toma. Su peso. Sus niveles de glucemia, presin arterial y colesterol. Su nivel de [place]. Otras afecciones que tenga, como enfermedades cardacas o renales. Cmo me afectan los carbohidratos? Los carbohidratos, o hidratos de carbono, afectan su nivel de glucemia ms que cualquier otro tipo de alimento. La ingesta de carbohidratos naturalmente aumenta la cantidad de [employer]. El  recuento de carbohidratos es un mtodo destinado a Catering manager un registro de la cantidad de carbohidratos que se consumen. El recuento de carbohidratos es importante para Theatre manager la glucemia a un nivel saludable, especialmente si utiliza insulina o toma determinadosmedicamentos por va oral para la diabetes. Es importante conocer la cantidad de carbohidratos que se pueden ingerir en cada comida sin correr Engineer, manufacturing. Esto es Psychologist, forensic. Su nutricionista puede ayudarlo a calcular la cantidad de carbohidratos que debeingerir en cada comida y en cada refrigerio. Cmo me afecta el alcohol? El alcohol puede provocar disminuciones sbitas de la glucemia (hipoglucemia), especialmente si utiliza insulina o toma determinados medicamentos por va oral para la diabetes. La hipoglucemia es una afeccin potencialmente mortal. Los sntomas de la hipoglucemia, como somnolencia, mareos y confusin, son similares a los sntomas de haber consumido demasiado alcohol. No beba alcohol si: Su mdico le indica no hacerlo. Est embarazada, puede estar embarazada o est tratando de [place]. Si bebe alcohol: No beba con el estmago vaco. Limite la cantidad que bebe: De 0 a 1 medida por da para las mujeres. De 0 a 2 medidas por da para los hombres. Est atento a la cantidad de alcohol que hay en las bebidas que toma. En los [place], una medida equivale a una botella de cerveza de 12 oz (355 ml), un vaso de vino de 5 oz (148 ml) o un vaso de una bebida alcohlica de alta graduacin de 1 oz (44 ml). Mantngase hidratado bebiendo agua, refrescos dietticos o t helado sin azcar. Tenga en cuenta que los refrescos comunes, los jugos y otras bebida para Optician, dispensing pueden contener mucha azcar y se deben contar como carbohidratos. Consejos para seguir Company secretary las etiquetas de los alimentos Comience por leer el tamao de la porcin en la "Informacin nutricional" en las etiquetas de los  alimentos envasados y las bebidas. La cantidad de caloras, carbohidratos, grasas y otros nutrientes mencionados en la etiqueta se basan en una porcin del alimento. Muchos alimentos contienen ms de una porcin por envase. Verifique la cantidad total de gramos (g) de carbohidratos totales en una porcin. Puede calcular la cantidad de porciones de carbohidratos al dividir el total de carbohidratos por 15. Por ejemplo, si un alimento tiene un total de 30 g de carbohidratos totales por porcin, equivale a 2 porciones de carbohidratos. Verifique la cantidad de gramos (g) de grasas saturadas y Physicist, medical  trans de una porcin. Escoja alimentos que no contengan estas grasas o que su contenido de estas sea [place]. Verifique la cantidad de miligramos (mg) de sal (sodio) en una porcin. La [employer] de las personas deben limitar la ingesta de sodio total a menos de 2300 mg Honeywell. Siempre consulte la informacin nutricional de los alimentos etiquetados como "con bajo contenido de grasa" o "sin grasa". Estos alimentos pueden tener un mayor contenido de Location manager agregada o carbohidratos refinados, y deben evitarse. Hable con su nutricionista para identificar sus objetivos diarios en cuanto a los nutrientes mencionados en la etiqueta. Al ir de compras Evite comprar alimentos procesados, enlatados o precocidos. Estos alimentos tienden a Special educational needs teacher mayor cantidad de [place], sodio y azcar agregada. Compre en la zona exterior de la tienda de comestibles. Esta es la zona donde se encuentran con mayor frecuencia las frutas y las verduras frescas, los cereales a granel, las carnes frescas y los productos lcteos frescos. Al cocinar Utilice mtodos de coccin a baja temperatura, como hornear, en lugar de mtodos de coccin a alta temperatura, como frer en abundante aceite. Cocine con aceites saludables, como el aceite de [place], canola o [place]. Evite cocinar con manteca, crema o carnes con alto contenido de grasa. Planificacin de  las comidas Coma las comidas y los refrigerios regularmente, preferentemente a la misma hora todos [place]. Evite pasar largos perodos de tiempo sin comer. Consuma alimentos ricos en fibra, como frutas frescas, verduras, frijoles y cereales integrales. Consulte a su nutricionista sobre cuntas porciones de carbohidratos puede consumir en cada comida. Consuma entre 4 y 6 onzas (entre 112 y 168 g) de protenas magras por da, como carnes [place], pollo, pescado, huevos o tofu. Una onza (oz) de protena magra equivale a: 1 onza (28 g) de carne, pollo o pescado. 1 huevo.  de taza (62 g) de tofu. Coma algunos alimentos por da que contengan grasas saludables, como aguacates, frutos secos, semillas y pescado. Qu alimentos debo comer? Lambert Mody Bayas. Manzanas. Naranjas. Duraznos. Damascos. Ciruelas. Uvas. Mango. Papaya.[place]. Kiwi. Cerezas. [place] Valeda Malm. [place]. Verduras de Boeing, que incluyen col rizada, [place], hojas de [place] y de [place]. Remolachas. Coliflor. Repollo. Brcoli. Zanahorias. Judas verdes. Tomates. Pimientos. Cebollas. Pepinos. Coles deBruselas. Granos Granos integrales, como panes, galletas, tortillas, cereales y pastas desalvado o integrales. Avena sin azcar. Quinua. Arroz integral o salvaje. Carnes y Psychiatric nurse. Carne de ave sin piel. Cortes magros de ave y carne de res. Tofu.Frutos secos. Semillas. Lcteos Productos lcteos sin grasa o con bajo contenido de [place], [place], yogur [place]. Es posible que los productos que se enumeran ms [place] no constituyan una lista completa de los alimentos y las bebidas que puede tomar. Consulte a un nutricionista para obtener ms informacin. Qu alimentos debo evitar? Lambert Mody Frutas enlatadas al almbar. Verduras Verduras enlatadas. Verduras congeladas con mantequilla o salsa de crema. Granos Productos elaborados con [place] y [place], como panes, pastas,bocadillos y cereales. Evite todos los  alimentos procesados. Carnes y otras protenas Cortes de carne con alto contenido de Lobbyist. Carne de ave con piel. Carnesempanizadas o fritas. Carne procesada. Evite las grasas saturadas. Lcteos [place]og[place], [place] enteros. Bebidas Bebidas azucaradas, como gaseosas o t helado. Es posible que los productos que se enumeran ms [place] no constituyan una lista completa de los alimentos y las bebidas que Nurse, adult. Consulte a un nutricionista para obtener ms informacin. Preguntas para hacerle al mdico Es necesario que me rena con Radio broadcast assistant en el cuidado de  la diabetes? Es necesario que me rena con un nutricionista? A qu nmero puedo llamar si tengo preguntas? Cules son los mejores momentos para controlar la glucemia? Dnde encontrar ms informacin: Asociacin Estadounidense de la Diabetes (American Diabetes Association): diabetes.org Academy of Nutrition and Dietetics (Academia de Nutricin y Information systems manager): www.eatright.Unisys Corporation of Diabetes and Digestive and Kidney Diseases ([place] la Diabetes y [place] y [place]): DesMoinesFuneral.dk Association of Diabetes Care and Education Specialists (Asociacin de Especialistas en Atencin y Educacin sobre la Diabetes): www.diabeteseducator.org Resumen Es importante tener hbitos alimenticios saludables debido a que sus niveles de Designer, television/film set sangre (glucosa) se ven afectados en gran medida por lo que come y bebe. Un plan de alimentacin saludable lo ayudar a controlar la glucemia y Theatre manager un estilo de vida saludable. El mdico puede recomendarle que trabaje con un nutricionista para elaborar el mejor plan para usted. Tenga en cuenta que los carbohidratos (hidratos de carbono) y el alcohol tienen efectos inmediatos en sus niveles de glucemia. Es importante contar los carbohidratos que ingiere y consumir alcohol con prudencia. Esta informacin no tiene Marine scientist el consejo del  mdico. Asegresede hacerle al mdico cualquier pregunta que tenga. Document Revised: 12/09/2019 Document Reviewed: 12/09/2019 Elsevier Patient Education  2021 [place], MD [facility]

## 2021-06-20 NOTE — Assessment & Plan Note (Signed)
Diet and nutrition discussed.  Continue atorvastatin 20 mg daily. Follow-up in 3 months.

## 2021-06-20 NOTE — Patient Instructions (Signed)
Diabetes mellitus y nutricin, en adultos Diabetes Mellitus and Nutrition, Adult Si sufre de diabetes, o diabetes mellitus, es muy importante tener hbitos alimenticios saludables debido a que sus niveles de Designer, television/film set sangre (glucosa) se ven afectados en gran medida por lo que come y bebe. Comer alimentos saludables en las cantidades correctas, aproximadamente a la misma hora todos los [place], [place] ayudar a: Aeronautical engineer glucemia. Disminuir el riesgo de sufrir una enfermedad cardaca. Mejorar la presin arterial. Science writer o mantener un peso saludable. Qu puede afectar mi plan de alimentacin? Todas las personas que sufren de diabetes son diferentes y cada una tiene necesidades diferentes en cuanto a un plan de alimentacin. El mdico puede recomendarle que trabaje con un nutricionista para elaborar el mejor plan para usted. Su plan de alimentacin puede variar segn factores como: Las caloras que necesita. Los medicamentos que toma. Su peso. Sus niveles de glucemia, presin arterial y colesterol. Su nivel de [place]. Otras afecciones que tenga, como enfermedades cardacas o renales. Cmo me afectan los carbohidratos? Los carbohidratos, o hidratos de carbono, afectan su nivel de glucemia ms que cualquier otro tipo de alimento. La ingesta de carbohidratos naturalmente aumenta la cantidad de [employer]. El recuento de carbohidratos es un mtodo destinado a Catering manager un registro de la cantidad de carbohidratos que se consumen. El recuento de carbohidratos es importante para Theatre manager la glucemia a un nivel saludable, especialmente si utiliza insulina o toma determinadosmedicamentos por va oral para la diabetes. Es importante conocer la cantidad de carbohidratos que se pueden ingerir en cada comida sin correr Engineer, manufacturing. Esto es Psychologist, forensic. Su nutricionista puede ayudarlo a calcular la cantidad de carbohidratos que debeingerir en cada comida y en cada refrigerio. Cmo me  afecta el alcohol? El alcohol puede provocar disminuciones sbitas de la glucemia (hipoglucemia), especialmente si utiliza insulina o toma determinados medicamentos por va oral para la diabetes. La hipoglucemia es una afeccin potencialmente mortal. Los sntomas de la hipoglucemia, como somnolencia, mareos y confusin, son similares a los sntomas de haber consumido demasiado alcohol. No beba alcohol si: Su mdico le indica no hacerlo. Est embarazada, puede estar embarazada o est tratando de [place]. Si bebe alcohol: No beba con el estmago vaco. Limite la cantidad que bebe: De 0 a 1 medida por da para las mujeres. De 0 a 2 medidas por da para los hombres. Est atento a la cantidad de alcohol que hay en las bebidas que toma. En los [place], una medida equivale a una botella de cerveza de 12 oz (355 ml), un vaso de vino de 5 oz (148 ml) o un vaso de una bebida alcohlica de alta graduacin de 1 oz (44 ml). Mantngase hidratado bebiendo agua, refrescos dietticos o t helado sin azcar. Tenga en cuenta que los refrescos comunes, los jugos y otras bebida para Optician, dispensing pueden contener mucha azcar y se deben contar como carbohidratos. Consejos para seguir Company secretary las etiquetas de los alimentos Comience por leer el tamao de la porcin en la "Informacin nutricional" en las etiquetas de los alimentos envasados y las bebidas. La cantidad de caloras, carbohidratos, grasas y otros nutrientes mencionados en la etiqueta se basan en una porcin del alimento. Muchos alimentos contienen ms de una porcin por envase. Verifique la cantidad total de gramos (g) de carbohidratos totales en una porcin. Puede calcular la cantidad de porciones de carbohidratos al dividir el total de carbohidratos por 15. Por ejemplo, si un alimento tiene un total  de 30 g de carbohidratos totales por porcin, equivale a 2 porciones de carbohidratos. Verifique la cantidad de gramos (g) de grasas  saturadas y grasas trans de una porcin. Escoja alimentos que no contengan estas grasas o que su contenido de estas sea [place]. Verifique la cantidad de miligramos (mg) de sal (sodio) en una porcin. La [employer] de las personas deben limitar la ingesta de sodio total a menos de 2300 mg [employer]. Siempre consulte la informacin nutricional de los alimentos etiquetados como "con bajo contenido de grasa" o "sin grasa". Estos alimentos pueden tener un mayor contenido de Location manager agregada o carbohidratos refinados, y deben evitarse. Hable con su nutricionista para identificar sus objetivos diarios en cuanto a los nutrientes mencionados en la etiqueta. Al ir de compras Evite comprar alimentos procesados, enlatados o precocidos. Estos alimentos tienden a Special educational needs teacher mayor cantidad de [place], sodio y azcar agregada. Compre en la zona exterior de la tienda de comestibles. Esta es la zona donde se encuentran con mayor frecuencia las frutas y las verduras frescas, los cereales a granel, las carnes frescas y los productos lcteos frescos. Al cocinar Utilice mtodos de coccin a baja temperatura, como hornear, en lugar de mtodos de coccin a alta temperatura, como frer en abundante aceite. Cocine con aceites saludables, como el aceite de [place], canola o [place]. Evite cocinar con manteca, crema o carnes con alto contenido de grasa. Planificacin de las comidas Coma las comidas y los refrigerios regularmente, preferentemente a la misma hora todos [place]. Evite pasar largos perodos de tiempo sin comer. Consuma alimentos ricos en fibra, como frutas frescas, verduras, frijoles y cereales integrales. Consulte a su nutricionista sobre cuntas porciones de carbohidratos puede consumir en cada comida. Consuma entre 4 y 6 onzas (entre 112 y 168 g) de protenas magras por da, como carnes [place], pollo, pescado, huevos o tofu. Una onza (oz) de protena magra equivale a: 1 onza (28 g) de carne, pollo o pescado. 1 huevo.  de  taza (62 g) de tofu. Coma algunos alimentos por da que contengan grasas saludables, como aguacates, frutos secos, semillas y pescado. Qu alimentos debo comer? Lambert Mody Bayas. Manzanas. Naranjas. Duraznos. Damascos. Ciruelas. Uvas. Mango. Papaya.[place]. Kiwi. Cerezas. [place] Valeda Malm. [place]spin[place]. Verduras de [employer], que incluyen col rizada, Dresser, hojas de [place] y de [place]. Remolachas. Coliflor. Repollo. Brcoli. Zanahorias. Judas verdes. Tomates. Pimientos. Cebollas. Pepinos. Coles deBruselas. Granos Granos integrales, como panes, galletas, tortillas, cereales y pastas desalvado o integrales. Avena sin azcar. Quinua. Arroz integral o salvaje. Carnes y Psychiatric nurse. Carne de ave sin piel. Cortes magros de ave y carne de res. Tofu.Frutos secos. Semillas. Lcteos Productos lcteos sin grasa o con bajo contenido de [place], [place], yogur [place]. Es posible que los productos que se enumeran ms [place] no constituyan una lista completa de los alimentos y las bebidas que puede tomar. Consulte a un nutricionista para obtener ms informacin. Qu alimentos debo evitar? Lambert Mody Frutas enlatadas al almbar. Verduras Verduras enlatadas. Verduras congeladas con mantequilla o salsa de crema. Granos Productos elaborados con [place] y [place], como panes, pastas,bocadillos y cereales. Evite todos los alimentos procesados. Carnes y otras protenas Cortes de carne con alto contenido de Lobbyist. Carne de ave con piel. Carnesempanizadas o fritas. Carne procesada. Evite las grasas saturadas. Lcteos [place]og[place], [place] enteros. Bebidas Bebidas azucaradas, como gaseosas o t helado. Es posible que los productos que se enumeran ms [place] no constituyan una lista completa de los alimentos y las bebidas que Nurse, adult.  Consulte a un nutricionista para obtener ms informacin. Preguntas para hacerle al mdico Es necesario que me rena con Radio broadcast assistant en el cuidado de la  diabetes? Es necesario que me rena con un nutricionista? A qu nmero puedo llamar si tengo preguntas? Cules son los mejores momentos para controlar la glucemia? Dnde encontrar ms informacin: Asociacin Estadounidense de la Diabetes (American Diabetes Association): diabetes.org Academy of Nutrition and Dietetics (Academia de Nutricin y Information systems manager): www.eatright.Unisys Corporation of Diabetes and Digestive and Kidney Diseases ([place] la Diabetes y [place] y [place]): [place]inesFuneral.dk Association of Diabetes Care and Education Specialists (Asociacin de Especialistas en Atencin y Educacin sobre la Diabetes): www.diabeteseducator.org Resumen Es importante tener hbitos alimenticios saludables debido a que sus niveles de Designer, television/film set sangre (glucosa) se ven afectados en gran medida por lo que come y bebe. Un plan de alimentacin saludable lo ayudar a controlar la glucemia y Theatre manager un estilo de vida saludable. El mdico puede recomendarle que trabaje con un nutricionista para elaborar el mejor plan para usted. Tenga en cuenta que los carbohidratos (hidratos de carbono) y el alcohol tienen efectos inmediatos en sus niveles de glucemia. Es importante contar los carbohidratos que ingiere y consumir alcohol con prudencia. Esta informacin no tiene Marine scientist el consejo del mdico. Asegresede hacerle al mdico cualquier pregunta que tenga. Document Revised: 12/09/2019 Document Reviewed: 12/09/2019 Elsevier Patient Education  2021 [employer].

## 2021-06-20 NOTE — Assessment & Plan Note (Signed)
Well-controlled hypertension.  Continue amlodipine 5 mg daily. Well-controlled diabetes.  Probably no need for once daily glipizide.  Advised to stop it.  Diet and nutrition discussed.  Need to decrease amount of daily carbohydrate intake.  Follow-up in 3 months.

## 2021-09-25 ENCOUNTER — Ambulatory Visit: Payer: 59 | Admitting: Emergency Medicine

## 2022-03-15 ENCOUNTER — Other Ambulatory Visit: Payer: Self-pay | Admitting: Emergency Medicine

## 2022-03-15 DIAGNOSIS — N6002 Solitary cyst of left breast: Secondary | ICD-10-CM

## 2022-03-15 DIAGNOSIS — Z09 Encounter for follow-up examination after completed treatment for conditions other than malignant neoplasm: Secondary | ICD-10-CM

## 2022-04-16 ENCOUNTER — Ambulatory Visit
Admission: RE | Admit: 2022-04-16 | Discharge: 2022-04-16 | Disposition: A | Discharge status: Home / Self Care | Payer: Self-pay | Source: Ambulatory Visit | Attending: Emergency Medicine | Admitting: Emergency Medicine

## 2022-04-16 ENCOUNTER — Ambulatory Visit
Admission: RE | Admit: 2022-04-16 | Discharge: 2022-04-16 | Disposition: A | Discharge status: Home / Self Care | Payer: 59 | Source: Ambulatory Visit | Attending: Emergency Medicine | Admitting: Emergency Medicine

## 2022-04-16 DIAGNOSIS — Z09 Encounter for follow-up examination after completed treatment for conditions other than malignant neoplasm: Secondary | ICD-10-CM

## 2022-04-16 DIAGNOSIS — N6002 Solitary cyst of left breast: Secondary | ICD-10-CM

## 2022-04-22 ENCOUNTER — Ambulatory Visit (INDEPENDENT_AMBULATORY_CARE_PROVIDER_SITE_OTHER): Payer: 59 | Admitting: Emergency Medicine

## 2022-04-22 ENCOUNTER — Encounter: Payer: Self-pay | Admitting: Emergency Medicine

## 2022-04-22 VITALS — BP 136/80 | HR 71 | Temp 98.3°F | Ht 61.0 in | Wt 170.0 lb

## 2022-04-22 DIAGNOSIS — E1169 Type 2 diabetes mellitus with other specified complication: Secondary | ICD-10-CM | POA: Diagnosis not present

## 2022-04-22 DIAGNOSIS — I152 Hypertension secondary to endocrine disorders: Secondary | ICD-10-CM | POA: Diagnosis not present

## 2022-04-22 DIAGNOSIS — E785 Hyperlipidemia, unspecified: Secondary | ICD-10-CM | POA: Diagnosis not present

## 2022-04-22 DIAGNOSIS — E1159 Type 2 diabetes mellitus with other circulatory complications: Secondary | ICD-10-CM | POA: Diagnosis not present

## 2022-04-22 LAB — COMPREHENSIVE METABOLIC PANEL
ALT: 72 U/L — ABNORMAL HIGH (ref 0–35)
AST: 42 U/L — ABNORMAL HIGH (ref 0–37)
Albumin: 4.2 g/dL (ref 3.5–5.2)
Alkaline Phosphatase: 96 U/L (ref 39–117)
BUN: 16 mg/dL (ref 6–23)
CO2: 29 mEq/L (ref 19–32)
Calcium: 9.5 mg/dL (ref 8.4–10.5)
Chloride: 104 mEq/L (ref 96–112)
Creatinine, Ser: 0.79 mg/dL (ref 0.40–1.20)
GFR: 80.9 mL/min (ref >60.00)
Glucose, Bld: 86 mg/dL (ref 70–99)
Potassium: 3.7 mEq/L (ref 3.5–5.1)
Sodium: 140 mEq/L (ref 135–145)
Total Bilirubin: 0.4 mg/dL (ref 0.2–1.2)
Total Protein: 7.5 g/dL (ref 6.0–8.3)

## 2022-04-22 LAB — CBC WITH DIFFERENTIAL/PLATELET
Basophils Absolute: 0.1 10*3/uL (ref 0.0–0.1)
Basophils Relative: 1.1 % (ref 0.0–3.0)
Eosinophils Absolute: 0.1 10*3/uL (ref 0.0–0.7)
Eosinophils Relative: 0.7 % (ref 0.0–5.0)
HCT: 40.4 % (ref 36.0–46.0)
Hemoglobin: 13.2 g/dL (ref 12.0–15.0)
Lymphocytes Relative: 41.2 % (ref 12.0–46.0)
Lymphs Abs: 3 10*3/uL (ref 0.7–4.0)
MCHC: 32.8 g/dL (ref 30.0–36.0)
MCV: 93 fl (ref 78.0–100.0)
Monocytes Absolute: 0.6 10*3/uL (ref 0.1–1.0)
Monocytes Relative: 8.2 % (ref 3.0–12.0)
Neutro Abs: 3.6 10*3/uL (ref 1.4–7.7)
Neutrophils Relative %: 48.8 % (ref 43.0–77.0)
Platelets: 250 10*3/uL (ref 150.0–400.0)
RBC: 4.35 Mil/uL (ref 3.87–5.11)
RDW: 12.8 % (ref 11.5–15.5)
WBC: 7.4 10*3/uL (ref 4.0–10.5)

## 2022-04-22 LAB — LIPID PANEL
Cholesterol: 169 mg/dL (ref 0–200)
HDL: 44.9 mg/dL (ref >39.00)
NonHDL: 123.9
Total CHOL/HDL Ratio: 4
Triglycerides: 333 mg/dL — ABNORMAL HIGH (ref 0.0–149.0)
VLDL: 66.6 mg/dL — ABNORMAL HIGH (ref 0.0–40.0)

## 2022-04-22 LAB — POCT GLYCOSYLATED HEMOGLOBIN (HGB A1C): Hemoglobin A1C: 6.6 % — AB (ref 4.0–5.6)

## 2022-04-22 NOTE — Assessment & Plan Note (Signed)
Well-controlled hypertension.  Continue amlodipine 5 mg daily. Well-controlled diabetes with hemoglobin A1c of 6.6 off medications, diet controlled. Cardiovascular risks associated with hypertension and diabetes discussed. Diet and nutrition discussed. Follow-up in 3 months.

## 2022-04-22 NOTE — Progress Notes (Signed)
Dawn Espinoza 61 y.o.   Chief Complaint  Patient presents with   Follow-up    HISTORY OF PRESENT ILLNESS: This is a 61 y.o. female here for follow-up of hypertension, dyslipidemia, and diabetes. Last office visit August 2022. Taking amlodipine 5 mg and atorvastatin 20 mg daily. Not taking any diabetes medications. No complaints or any other medical concerns today.  HPI   Prior to Admission medications   Medication Sig Start Date End Date Taking? Authorizing Provider  amLODipine (NORVASC) 5 MG tablet Take 1 tablet (5 mg total) by mouth daily. 03/20/21   Horald Pollen, MD  atorvastatin (LIPITOR) 20 MG tablet Take 1 tablet (20 mg total) by mouth daily. 03/20/21   Horald Pollen, MD  Cholecalciferol (VITAMIN D-3) 125 MCG (5000 UT) TABS Take 1 tablet by mouth daily. Patient not taking: No sig reported 06/21/20   Hilts, Legrand Como, MD  glipiZIDE (GLUCOTROL) 5 MG tablet Take 1 tablet (5 mg total) by mouth daily with breakfast. 03/20/21 06/18/21  Horald Pollen, MD  Glucosamine Sulfate 1000 MG CAPS Take 1 capsule (1,000 mg total) by mouth 2 (two) times daily. Patient not taking: No sig reported 06/21/20   Hilts, Michael, MD  HYDROcodone-acetaminophen (NORCO/VICODIN) 5-325 MG tablet Take 1-2 tablets by mouth every 6 (six) hours as needed. Patient not taking: No sig reported 08/17/20   Loura Halt A, NP  Menatetrenone (VITAMIN K2) 100 MCG TABS Take 100 mcg by mouth daily. Patient not taking: No sig reported 06/21/20   Hilts, Michael, MD  nabumetone (RELAFEN) 500 MG tablet Take 1 tablet (500 mg total) by mouth 2 (two) times daily as needed. Patient not taking: No sig reported 06/21/20   Hilts, Legrand Como, MD  ondansetron (ZOFRAN ODT) 4 MG disintegrating tablet Take 1 tablet (4 mg total) by mouth every 8 (eight) hours as needed for nausea or vomiting. Patient not taking: No sig reported 08/17/20   Loura Halt A, NP    No Known Allergies  Patient Active Problem List   Diagnosis Date Noted    Hypertension associated with diabetes ([place]) 03/15/2020   Dyslipidemia associated with type 2 diabetes mellitus ([place]) 03/15/2020   Elevated liver enzymes 09/01/2019   Hx of colonic polyps 09/01/2019   Influenza vaccination declined 09/01/2019   Arthritis of knee 07/14/2019   Fatty liver 07/14/2019   History of total left knee replacement 07/14/2019   Mixed hyperlipidemia 07/14/2019   Obesity (BMI 30-39.9) 07/14/2019    Past Medical History:  Diagnosis Date   Hepatic steatosis    HLD (hyperlipidemia)    Hypertension    Internal hemorrhoids     Past Surgical History:  Procedure Laterality Date   NO PAST SURGERIES      Social History   Socioeconomic History   Marital status: Single    Spouse name: Not on file   Number of children: 6   Years of education: Not on file   Highest education level: Not on file  Occupational History   Not on file  Tobacco Use   Smoking status: Never   Smokeless tobacco: Never  Substance and Sexual Activity   Alcohol use: No    Alcohol/week: 0.0 standard drinks   Drug use: No   Sexual activity: Never  Other Topics Concern   Not on file  Social History Narrative   ** Merged History Encounter **       Social Determinants of Health   Financial Resource Strain: Not on file  Food Insecurity: Not on file  Transportation Needs: Not on file  Physical Activity: Not on file  Stress: Not on file  Social Connections: Not on file  Intimate Partner Violence: Not on file    Family History  Problem Relation Age of Onset   Hypertension Mother    Alcohol abuse Father    Heart disease Father      Review of Systems  Constitutional: Negative.  Negative for chills and fever.  HENT: Negative.  Negative for congestion and sore throat.   Respiratory: Negative.  Negative for cough and shortness of breath.   Cardiovascular: Negative.  Negative for chest pain and palpitations.  Gastrointestinal: Negative.  Negative for abdominal pain, nausea and  vomiting.  Genitourinary: Negative.   Skin: Negative.  Negative for rash.  Neurological:  Negative for dizziness and headaches.  All other systems reviewed and are negative. Today's Vitals   04/22/22 1521  BP: 136/80  Pulse: 71  Temp: 98.3 F (36.8 C)  TempSrc: Oral  SpO2: 96%  Weight: 170 lb (77.1 kg)  Height: '5\' 1"'$  (1.549 m)   Body mass index is 32.12 kg/m.   Physical Exam Vitals reviewed.  Constitutional:      Appearance: Normal appearance.  HENT:     Head: Normocephalic.     Mouth/Throat:     Mouth: Mucous membranes are moist.     Pharynx: Oropharynx is clear.  Eyes:     Extraocular Movements: Extraocular movements intact.     Conjunctiva/sclera: Conjunctivae normal.     Pupils: Pupils are equal, round, and reactive to light.  Cardiovascular:     Rate and Rhythm: Normal rate and regular rhythm.     Pulses: Normal pulses.     Heart sounds: Normal heart sounds.  Pulmonary:     Effort: Pulmonary effort is normal.     Breath sounds: Normal breath sounds.  Musculoskeletal:        General: Normal range of motion.     Cervical back: No tenderness.  Lymphadenopathy:     Cervical: No cervical adenopathy.  Skin:    General: Skin is warm and dry.     Capillary Refill: Capillary refill takes less than 2 seconds.  Neurological:     General: No focal deficit present.     Mental Status: She is alert and oriented to person, place, and time.  Psychiatric:        Mood and Affect: Mood normal.        Behavior: Behavior normal.    Results for orders placed or performed in visit on 04/22/22 (from the past 24 hour(s))  POCT HgB A1C     Status: Abnormal   Collection Time: 04/22/22  4:03 PM  Result Value Ref Range   Hemoglobin A1C 6.6 (A) 4.0 - 5.6 %   HbA1c POC (<> result, manual entry)     HbA1c, POC (prediabetic range)     HbA1c, POC (controlled diabetic range)      ASSESSMENT & PLAN: A total of 46 minutes was spent with the patient and counseling/coordination of  care regarding preparing for this visit, review of most recent office visit notes, review of most recent blood work results including today's hemoglobin A1c, cardiovascular risks associated with hypertension, dyslipidemia, diabetes, education on nutrition, review of all medications, prognosis, documentation, need for follow-up.  Problem List Items Addressed This Visit       Cardiovascular and Mediastinum   Hypertension associated with diabetes ([place]) - Primary    Well-controlled hypertension.  Continue amlodipine 5 mg daily.  Well-controlled diabetes with hemoglobin A1c of 6.6 off medications, diet controlled. Cardiovascular risks associated with hypertension and diabetes discussed. Diet and nutrition discussed. Follow-up in 3 months.       Relevant Orders   CBC with Differential/Platelet   Comprehensive metabolic panel     Endocrine   Dyslipidemia associated with type 2 diabetes mellitus ([place])    Stable.  Diet and nutrition discussed. Continue atorvastatin 20 mg daily. The 10-year ASCVD risk score (Arnett DK, et al., 2019) is: 8%   Values used to calculate the score:     Age: 54 years     Sex: Female     Is Non-Hispanic African American: No     Diabetic: Yes     Tobacco smoker: No     Systolic Blood Pressure: 062 mmHg     Is BP treated: Yes     HDL Cholesterol: 52 mg/dL     Total Cholesterol: 131 mg/dL Follow-up in 3 months.       Relevant Orders   Lipid panel   Other Visit Diagnoses     Type 2 diabetes mellitus with other specified complication, without long-term current use of insulin ([place])       Relevant Orders   POCT HgB A1C (Completed)      Patient Instructions  Diabetes mellitus y nutricin, en adultos Diabetes Mellitus and Nutrition, Adult Si sufre de diabetes, o diabetes mellitus, es muy importante tener hbitos alimenticios saludables debido a que sus niveles de Designer, television/film set sangre (glucosa) se ven afectados en gran medida por lo que come y bebe. [place]  alimentos saludables en las cantidades correctas, aproximadamente a la misma hora todos los [place], [place] ayudar a: Chief Technology Officer su glucemia. Disminuir el riesgo de sufrir una enfermedad cardaca. Mejorar la presin arterial. Science writer o mantener un peso saludable. Qu puede afectar mi plan de alimentacin? Todas las personas que sufren de diabetes son diferentes y cada una tiene necesidades diferentes en cuanto a un plan de alimentacin. El mdico puede recomendarle que trabaje con un nutricionista para elaborar el mejor plan para usted. Su plan de alimentacin puede variar segn factores como: Las caloras que necesita. Los medicamentos que toma. Su peso. Sus niveles de glucemia, presin arterial y colesterol. Su nivel de [place]. Otras afecciones que tenga, como enfermedades cardacas o renales. Cmo me afectan los carbohidratos? Los carbohidratos, o hidratos de carbono, afectan su nivel de glucemia ms que cualquier otro tipo de alimento. La ingesta de carbohidratos aumenta la cantidad de [employer]. Es importante conocer la cantidad de carbohidratos que se pueden ingerir en cada comida sin correr Engineer, manufacturing. Esto es Psychologist, forensic. Su nutricionista puede ayudarlo a calcular la cantidad de carbohidratos que debe ingerir en cada comida y en cada refrigerio. Cmo me afecta el alcohol? El alcohol puede provocar una disminucin de la glucemia (hipoglucemia), especialmente si [place] insulina o toma determinados medicamentos por va oral para la diabetes. La hipoglucemia es una afeccin potencialmente mortal. Los sntomas de la hipoglucemia, como somnolencia, mareos y confusin, son similares a los sntomas de haber consumido demasiado alcohol. No beba alcohol si: Su mdico le indica no hacerlo. Est embarazada, puede estar embarazada o est tratando de [place]. Si bebe alcohol: Limite la cantidad que bebe a lo siguiente: De 0 a 1 medida por da para las mujeres. De  0 a 2 medidas por da para los hombres. Sepa cunta cantidad de alcohol hay en las bebidas que toma. En los [place],  una medida equivale a una botella de cerveza de 12 oz (355 ml), un vaso de vino de 5 oz (148 ml) o un vaso de una bebida alcohlica de alta graduacin de 1 oz (44 ml). Mantngase hidratado bebiendo agua, refrescos dietticos o t helado sin azcar. Tenga en cuenta que los refrescos comunes, los jugos y otras bebidas para mezclar pueden contener Freight forwarder y se deben contar como carbohidratos. Consejos para seguir Company secretary las etiquetas de los alimentos Comience por leer el tamao de la porcin en la etiqueta de Informacin nutricional de los alimentos envasados y las bebidas. La cantidad de caloras, carbohidratos, grasas y otros nutrientes detallados en la etiqueta se basan en una porcin del alimento. Muchos alimentos contienen ms de una porcin por envase. Verifique la cantidad total de gramos (g) de carbohidratos totales en una porcin. Verifique la cantidad de gramos de grasas saturadas y grasas trans en una porcin. Escoja alimentos que no contengan estas grasas o que su contenido de estas sea [place]. Verifique la cantidad de miligramos (mg) de sal (sodio) en una porcin. [place] [employer] de las personas deben limitar la ingesta de sodio total a menos de 2300 mg Honeywell. Siempre consulte la informacin nutricional de los alimentos etiquetados como "con bajo contenido de grasa" o "sin grasa". Estos alimentos pueden tener un mayor contenido de Location manager agregada o carbohidratos refinados, y deben evitarse. Hable con su nutricionista para identificar sus objetivos diarios en cuanto a los nutrientes mencionados en la etiqueta. Al ir de compras Evite comprar alimentos procesados, enlatados o precocidos. Estos alimentos tienden a Special educational needs teacher mayor cantidad de Comeri­o, sodio y azcar agregada. Compre en la zona exterior de la tienda de comestibles. Esta es la zona donde se encuentran  con mayor frecuencia las frutas y las verduras frescas, los cereales a granel, las carnes frescas y los productos lcteos frescos. Al cocinar Use mtodos de coccin a baja temperatura, como hornear, en lugar de mtodos de coccin a alta temperatura, como frer en abundante aceite. Cocine con aceites saludables, como el aceite de [place], canola o [place]. Evite cocinar con manteca, crema o carnes con alto contenido de grasa. Planificacin de las comidas Coma las comidas y los refrigerios regularmente, preferentemente a la misma hora todos [place]. Evite pasar largos perodos de tiempo sin comer. Consuma alimentos ricos en fibra, como frutas frescas, verduras, frijoles y cereales integrales. Consuma entre 4 y 6 onzas (entre 112 y 168 g) de protenas magras por da, como carnes [place], pollo, pescado, huevos o tofu. Una onza (oz) (28 g) de protena magra equivale a: 1 onza (28 g) de carne, pollo o pescado. 1 huevo.  taza (62 g) de tofu. Coma algunos alimentos por da que contengan grasas saludables, como aguacates, frutos secos, semillas y pescado. Qu alimentos debo comer? Lambert Mody Bayas. Manzanas. Naranjas. Duraznos. Damascos. Ciruelas. Uvas. Mangos. Papayas. Granadas. Kiwi. Cerezas. Verduras Verduras de Boeing, que incluyen [place], [place], col rizada, acelga, hojas de berza, hojas de mostaza y repollo. Remolachas. Coliflor. Brcoli. Zanahorias. Judas verdes. Tomates. Pimientos. Cebollas. Pepinos. Coles de Bruselas. Granos Granos integrales, como panes, galletas, tortillas, cereales y pastas de salvado o integrales. Avena sin azcar. Quinua. Arroz integral o salvaje. Carnes y otras protenas Frutos de mar. Carne de ave sin piel. Cortes magros de ave y carne de res. Tofu. Frutos secos. Semillas. Lcteos Productos lcteos sin grasa o con bajo contenido de [place], [place], yogur y [place]. Es posible que los productos detallados arriba no constituyan  una [employer] de los alimentos y  las bebidas que puede tomar. Consulte a un nutricionista para obtener ms informacin. Qu alimentos debo evitar? Lambert Mody Frutas enlatadas al almbar. Verduras Verduras enlatadas. Verduras congeladas con mantequilla o salsa de crema. Granos Productos elaborados con [place] y [place], como panes, pastas, bocadillos y cereales. Evite todos los alimentos procesados. Carnes y otras protenas Cortes de carne con alto contenido de Lobbyist. Carne de ave con piel. Carnes empanizadas o fritas. Carne procesada. Evite las grasas saturadas. Lcteos Yogur, Echo enteros. Bebidas Bebidas azucaradas, como gaseosas o t helado. Es posible que los productos que se enumeran ms [place] no constituyan una lista completa de los alimentos y las bebidas que Nurse, adult. Consulte a un nutricionista para obtener ms informacin. Preguntas para hacerle al mdico Debo consultar con un especialista certificado en atencin y educacin sobre la diabetes? Es necesario que me rena con un nutricionista? A qu nmero puedo llamar si tengo preguntas? Cules son los mejores momentos para controlar la glucemia? Dnde encontrar ms informacin: American Diabetes Association (Asociacin Estadounidense de la Diabetes): diabetes.org Academy of Nutrition and Dietetics (Academia de Nutricin y Information systems manager): eatright.Unisys Corporation of Diabetes and Digestive and Kidney Diseases ([place] la Diabetes y [place] y [place]): AmenCredit.is Association of Diabetes Care & Education Specialists (Asociacin de Especialistas en Atencin y Educacin sobre la Diabetes): diabeteseducator.org Resumen Es importante tener hbitos alimenticios saludables debido a que sus niveles de Designer, television/film set sangre (glucosa) se ven afectados en gran medida por lo que come y bebe. Es importante consumir alcohol con prudencia. Un plan de comidas saludable lo ayudar a controlar la glucosa en sangre y a  reducir el riesgo de enfermedades cardacas. El mdico puede recomendarle que trabaje con un nutricionista para elaborar el mejor plan para usted. Esta informacin no tiene Marine scientist el consejo del mdico. Asegrese de hacerle al mdico cualquier pregunta que tenga. Document Revised: 07/12/2020 Document Reviewed: 07/12/2020 Elsevier Patient Education  [place], MD [facility]

## 2022-04-22 NOTE — Patient Instructions (Signed)
Diabetes mellitus y nutricin, en adultos Diabetes Mellitus and Nutrition, Adult Si sufre de diabetes, o diabetes mellitus, es muy importante tener hbitos alimenticios saludables debido a que sus niveles de azcar en la sangre (glucosa) se ven afectados en gran medida por lo que come y bebe. Comer alimentos saludables en las cantidades correctas, aproximadamente a la misma hora todos los das, lo ayudar a: Controlar su glucemia. Disminuir el riesgo de sufrir una enfermedad cardaca. Mejorar la presin arterial. Alcanzar o mantener un peso saludable. Qu puede afectar mi plan de alimentacin? Todas las personas que sufren de diabetes son diferentes y cada una tiene necesidades diferentes en cuanto a un plan de alimentacin. El mdico puede recomendarle que trabaje con un nutricionista para elaborar el mejor plan para usted. Su plan de alimentacin puede variar segn factores como: Las caloras que necesita. Los medicamentos que toma. Su peso. Sus niveles de glucemia, presin arterial y colesterol. Su nivel de actividad. Otras afecciones que tenga, como enfermedades cardacas o renales. Cmo me afectan los carbohidratos? Los carbohidratos, o hidratos de carbono, afectan su nivel de glucemia ms que cualquier otro tipo de alimento. La ingesta de carbohidratos aumenta la cantidad de glucosa en la sangre. Es importante conocer la cantidad de carbohidratos que se pueden ingerir en cada comida sin correr ningn riesgo. Esto es diferente en cada persona. Su nutricionista puede ayudarlo a calcular la cantidad de carbohidratos que debe ingerir en cada comida y en cada refrigerio. Cmo me afecta el alcohol? El alcohol puede provocar una disminucin de la glucemia (hipoglucemia), especialmente si usa insulina o toma determinados medicamentos por va oral para la diabetes. La hipoglucemia es una afeccin potencialmente mortal. Los sntomas de la hipoglucemia, como somnolencia, mareos y confusin, son  similares a los sntomas de haber consumido demasiado alcohol. No beba alcohol si: Su mdico le indica no hacerlo. Est embarazada, puede estar embarazada o est tratando de quedar embarazada. Si bebe alcohol: Limite la cantidad que bebe a lo siguiente: De 0 a 1 medida por da para las mujeres. De 0 a 2 medidas por da para los hombres. Sepa cunta cantidad de alcohol hay en las bebidas que toma. En los [place], una medida equivale a una botella de cerveza de 12 oz (355 ml), un vaso de vino de 5 oz (148 ml) o un vaso de una bebida alcohlica de alta graduacin de 1 oz (44 ml). Mantngase hidratado bebiendo agua, refrescos dietticos o t helado sin azcar. Tenga en cuenta que los refrescos comunes, los jugos y otras bebidas para mezclar pueden contener mucha azcar y se deben contar como carbohidratos. Consejos para seguir este plan  Leer las etiquetas de los alimentos Comience por leer el tamao de la porcin en la etiqueta de Informacin nutricional de los alimentos envasados y las bebidas. La cantidad de caloras, carbohidratos, grasas y otros nutrientes detallados en la etiqueta se basan en una porcin del alimento. Muchos alimentos contienen ms de una porcin por envase. Verifique la cantidad total de gramos (g) de carbohidratos totales en una porcin. Verifique la cantidad de gramos de grasas saturadas y grasas trans en una porcin. Escoja alimentos que no contengan estas grasas o que su contenido de estas sea bajo. Verifique la cantidad de miligramos (mg) de sal (sodio) en una porcin. La mayora de las personas deben limitar la ingesta de sodio total a menos de 2300 mg por da. Siempre consulte la informacin nutricional de los alimentos etiquetados como "con bajo contenido de grasa" o "sin grasa".   Estos alimentos pueden tener un mayor contenido de azcar agregada o carbohidratos refinados, y deben evitarse. Hable con su nutricionista para identificar sus objetivos diarios en  cuanto a los nutrientes mencionados en la etiqueta. Al ir de compras Evite comprar alimentos procesados, enlatados o precocidos. Estos alimentos tienden a tener una mayor cantidad de grasa, sodio y azcar agregada. Compre en la zona exterior de la tienda de comestibles. Esta es la zona donde se encuentran con mayor frecuencia las frutas y las verduras frescas, los cereales a granel, las carnes frescas y los productos lcteos frescos. Al cocinar Use mtodos de coccin a baja temperatura, como hornear, en lugar de mtodos de coccin a alta temperatura, como frer en abundante aceite. Cocine con aceites saludables, como el aceite de oliva, canola o girasol. Evite cocinar con manteca, crema o carnes con alto contenido de grasa. Planificacin de las comidas Coma las comidas y los refrigerios regularmente, preferentemente a la misma hora todos los das. Evite pasar largos perodos de tiempo sin comer. Consuma alimentos ricos en fibra, como frutas frescas, verduras, frijoles y cereales integrales. Consuma entre 4 y 6 onzas (entre 112 y 168 g) de protenas magras por da, como carnes magras, pollo, pescado, huevos o tofu. Una onza (oz) (28 g) de protena magra equivale a: 1 onza (28 g) de carne, pollo o pescado. 1 huevo.  taza (62 g) de tofu. Coma algunos alimentos por da que contengan grasas saludables, como aguacates, frutos secos, semillas y pescado. Qu alimentos debo comer? Frutas Bay[place]. Manzanas. Naranjas. Duraznos. Damascos. Ciruelas. Uvas. Mangos. Papayas. Granadas. Kiwi. Cerezas. Verduras Verduras de hoja verde, que incluyen lechuga, espinaca, col rizada, acelga, hojas de berza, hojas de mostaza y repollo. Remolachas. Coliflor. Brcoli. Zanahorias. Judas verdes. Tomates. Pimientos. Cebollas. Pepinos. Coles de Bruselas. Granos Granos integrales, como panes, galletas, tortillas, cereales y pastas de salvado o integrales. Avena sin azcar. Quinua. Arroz integral o salvaje. Carnes y otras  protenas Frutos de mar. Carne de ave sin piel. Cortes magros de ave y carne de res. Tofu. Frutos secos. Semillas. Lcteos Productos lcteos sin grasa o con bajo contenido de grasa, como leche, yogur y queso. Es posible que los productos detallados arriba no constituyan una lista completa de los alimentos y las bebidas que puede tomar. Consulte a un nutricionista para obtener ms informacin. Qu alimentos debo evitar? Frutas Frutas enlatadas al almbar. Verduras Verduras enlatadas. Verduras congeladas con mantequilla o salsa de crema. Granos Productos elaborados con harina y harina blanca refinada, como panes, pastas, bocadillos y cereales. Evite todos los alimentos procesados. Carnes y otras protenas Cortes de carne con alto contenido de grasa. Carne de ave con piel. Carnes empanizadas o fritas. Carne procesada. Evite las grasas saturadas. Lcteos Yogur, queso o leche enteros. Bebidas Bebidas azucaradas, como gaseosas o t helado. Es posible que los productos que se enumeran ms arriba no constituyan una lista completa de los alimentos y las bebidas que debe evitar. Consulte a un nutricionista para obtener ms informacin. Preguntas para hacerle al mdico Debo consultar con un especialista certificado en atencin y educacin sobre la diabetes? Es necesario que me rena con un nutricionista? A qu nmero puedo llamar si tengo preguntas? Cules son los mejores momentos para controlar la glucemia? Dnde encontrar ms informacin: American Diabetes Association (Asociacin Estadounidense de la Diabetes): diabetes.org Academy of Nutrition and Dietetics (Academia de Nutricin y Diettica): eatright.org National Institute of Diabetes and Digestive and Kidney Diseases (Instituto Nacional de la Diabetes y las Enfermedades Digestivas y Renales): niddk.nih.gov Association of Diabetes   Care & Education Specialists (Asociacin de Especialistas en Atencin y Educacin sobre la Diabetes):  diabeteseducator.org Resumen Es importante tener hbitos alimenticios saludables debido a que sus niveles de azcar en la sangre (glucosa) se ven afectados en gran medida por lo que come y bebe. Es importante consumir alcohol con prudencia. Un plan de comidas saludable lo ayudar a controlar la glucosa en sangre y a reducir el riesgo de enfermedades cardacas. El mdico puede recomendarle que trabaje con un nutricionista para elaborar el mejor plan para usted. Esta informacin no tiene como fin reemplazar el consejo del mdico. Asegrese de hacerle al mdico cualquier pregunta que tenga. Document Revised: 07/12/2020 Document Reviewed: 07/12/2020 Elsevier Patient Education  2023 Elsevier Inc.  

## 2022-04-22 NOTE — Assessment & Plan Note (Signed)
Stable.  Diet and nutrition discussed. Continue atorvastatin 20 mg daily. The 10-year ASCVD risk score (Arnett DK, et al., 2019) is: 8%   Values used to calculate the score:     Age: 61 years     Sex: Female     Is Non-Hispanic African American: No     Diabetic: Yes     Tobacco smoker: No     Systolic Blood Pressure: 056 mmHg     Is BP treated: Yes     HDL Cholesterol: 52 mg/dL     Total Cholesterol: 131 mg/dL Follow-up in 3 months.

## 2022-04-23 LAB — LDL CHOLESTEROL, DIRECT: Direct LDL: 84 mg/dL

## 2022-04-26 ENCOUNTER — Other Ambulatory Visit: Payer: Self-pay | Admitting: *Deleted

## 2022-04-26 DIAGNOSIS — I152 Hypertension secondary to endocrine disorders: Secondary | ICD-10-CM

## 2022-04-26 DIAGNOSIS — E1169 Type 2 diabetes mellitus with other specified complication: Secondary | ICD-10-CM

## 2022-04-26 MED ORDER — AMLODIPINE BESYLATE 5 MG PO TABS: 5.0000 mg | ORAL_TABLET | Freq: Every day | ORAL | 3 refills | Status: DC

## 2022-04-26 MED ORDER — ATORVASTATIN CALCIUM 20 MG PO TABS: 20.0000 mg | ORAL_TABLET | Freq: Every day | ORAL | 3 refills | Status: DC

## 2022-07-23 ENCOUNTER — Encounter: Payer: Self-pay | Admitting: Emergency Medicine

## 2022-07-23 ENCOUNTER — Ambulatory Visit (INDEPENDENT_AMBULATORY_CARE_PROVIDER_SITE_OTHER): Payer: 59 | Admitting: Emergency Medicine

## 2022-07-23 VITALS — BP 124/82 | HR 66 | Temp 98.1°F | Ht 61.0 in | Wt 172.0 lb

## 2022-07-23 DIAGNOSIS — I10 Essential (primary) hypertension: Secondary | ICD-10-CM | POA: Diagnosis not present

## 2022-07-23 DIAGNOSIS — E785 Hyperlipidemia, unspecified: Secondary | ICD-10-CM | POA: Diagnosis not present

## 2022-07-23 DIAGNOSIS — E1169 Type 2 diabetes mellitus with other specified complication: Secondary | ICD-10-CM | POA: Diagnosis not present

## 2022-07-23 DIAGNOSIS — I152 Hypertension secondary to endocrine disorders: Secondary | ICD-10-CM

## 2022-07-23 DIAGNOSIS — E1159 Type 2 diabetes mellitus with other circulatory complications: Secondary | ICD-10-CM | POA: Diagnosis not present

## 2022-07-23 LAB — POCT GLYCOSYLATED HEMOGLOBIN (HGB A1C): Hemoglobin A1C: 6.6 % — AB (ref 4.0–5.6)

## 2022-07-23 MED ORDER — AMLODIPINE BESYLATE 5 MG PO TABS: 5.0000 mg | ORAL_TABLET | Freq: Every day | ORAL | 3 refills | Status: DC

## 2022-07-23 MED ORDER — ATORVASTATIN CALCIUM 20 MG PO TABS: 20.0000 mg | ORAL_TABLET | Freq: Every day | ORAL | 3 refills | Status: DC

## 2022-07-23 NOTE — Progress Notes (Signed)
Dawn Espinoza 61 y.o.   Chief Complaint  Patient presents with   Follow-up    73mth f/u appt DM     HISTORY OF PRESENT ILLNESS: This is a 61y.o. female with history of diabetes here for 326-monthollow-up. No complaints or medical concerns today.  HPI   Prior to Admission medications   Medication Sig Start Date End Date Taking? Authorizing Provider  amLODipine (NORVASC) 5 MG tablet Take 1 tablet (5 mg total) by mouth daily. 04/26/22  Yes Marrio Scribner, MiInes BloomerMD  atorvastatin (LIPITOR) 20 MG tablet Take 1 tablet (20 mg total) by mouth daily. 04/26/22  Yes SaHorald PollenMD    No Known Allergies  Patient Active Problem List   Diagnosis Date Noted   Hypertension associated with diabetes ([facility]   Dyslipidemia associated with type 2 diabetes mellitus ([facility]/28/2021   Elevated liver enzymes 09/01/2019   Hx of colonic polyps 09/01/2019   Influenza vaccination declined 09/01/2019   Arthritis of knee 07/14/2019   Fatty liver 07/14/2019   History of total left knee replacement 07/14/2019   Mixed hyperlipidemia 07/14/2019   Obesity (BMI 30-39.9) 07/14/2019    Past Medical History:  Diagnosis Date   Hepatic steatosis    HLD (hyperlipidemia)    Hypertension    Internal hemorrhoids     Past Surgical History:  Procedure Laterality Date   NO PAST SURGERIES      Social History   Socioeconomic History   Marital status: Single    Spouse name: Not on file   Number of children: 6   Years of education: Not on file   Highest education level: Not on file  Occupational History   Not on file  Tobacco Use   Smoking status: Never   Smokeless tobacco: Never  Substance and Sexual Activity   Alcohol use: No    Alcohol/week: 0.0 standard drinks of alcohol   Drug use: No   Sexual activity: Never  Other Topics Concern   Not on file  Social History Narrative   ** Merged History Encounter **       Social Determinants of Health   Financial Resource Strain:  Not on file  Food Insecurity: Not on file  Transportation Needs: Not on file  Physical Activity: Not on file  Stress: Not on file  Social Connections: Not on file  Intimate Partner Violence: Not on file    Family History  Problem Relation Age of Onset   Hypertension Mother    Alcohol abuse Father    Heart disease Father      Review of Systems  Constitutional: Negative.  Negative for chills and fever.  HENT: Negative.  Negative for congestion and sore throat.   Respiratory: Negative.  Negative for cough and shortness of breath.   Cardiovascular: Negative.  Negative for chest pain and palpitations.  Gastrointestinal: Negative.  Negative for abdominal pain, diarrhea, nausea and vomiting.  Genitourinary: Negative.  Negative for dysuria and hematuria.  Skin: Negative.  Negative for rash.  Neurological: Negative.  Negative for dizziness and headaches.  All other systems reviewed and are negative.   Vitals:   07/23/22 1536  BP: 124/82  Pulse: 66  Temp: 98.1 F (36.7 C)  SpO2: 95%   Wt Readings from Last 3 Encounters:  07/23/22 172 lb (78 kg)  04/22/22 170 lb (77.1 kg)  06/20/21 172 lb (78 kg)    Physical Exam Vitals reviewed.  Constitutional:      Appearance: Normal appearance.  HENT:     Head: Normocephalic.  Eyes:     Extraocular Movements: Extraocular movements intact.     Conjunctiva/sclera: Conjunctivae normal.     Pupils: Pupils are equal, round, and reactive to light.  Cardiovascular:     Rate and Rhythm: Normal rate and regular rhythm.     Pulses: Normal pulses.     Heart sounds: Normal heart sounds.  Pulmonary:     Effort: Pulmonary effort is normal.     Breath sounds: Normal breath sounds.  Abdominal:     Palpations: Abdomen is soft.     Tenderness: There is no abdominal tenderness.  Musculoskeletal:     Cervical back: No tenderness.     Right lower leg: No edema.     Left lower leg: No edema.  Lymphadenopathy:     Cervical: No cervical  adenopathy.  Skin:    General: Skin is warm and dry.     Capillary Refill: Capillary refill takes less than 2 seconds.  Neurological:     General: No focal deficit present.     Mental Status: She is alert and oriented to person, place, and time.  Psychiatric:        Mood and Affect: Mood normal.        Behavior: Behavior normal.     Results for orders placed or performed in visit on 07/23/22 (from the past 24 hour(s))  POCT HgB A1C     Status: Abnormal   Collection Time: 07/23/22  3:49 PM  Result Value Ref Range   Hemoglobin A1C 6.6 (A) 4.0 - 5.6 %   HbA1c POC (<> result, manual entry)     HbA1c, POC (prediabetic range)     HbA1c, POC (controlled diabetic range)      ASSESSMENT & PLAN: A total of 42 minutes was spent with the patient and counseling/coordination of care regarding preparing for this visit, review of most recent office visit notes, review of most recent blood work results including interpretation of today's hemoglobin A1c, review of multiple chronic medical problems and their management, review of all medications, cardiovascular risks associated with hypertension diabetes and dyslipidemia, education on nutrition, prognosis, documentation, and need for follow-up.  Problem List Items Addressed This Visit       Cardiovascular and Mediastinum   Hypertension associated with diabetes ([place])    Well-controlled hypertension. Continue amlodipine 5 mg daily. Well-controlled diabetes with hemoglobin A1c of 6.6. Cardiovascular risks associated with hypertension and diabetes discussed. Diet and nutrition discussed. BP Readings from Last 3 Encounters:  07/23/22 124/82  04/22/22 136/80  06/20/21 126/60   Lab Results  Component Value Date   HGBA1C 6.6 (A) 07/23/2022         Relevant Medications   amLODipine (NORVASC) 5 MG tablet   atorvastatin (LIPITOR) 20 MG tablet   Other Relevant Orders   Ambulatory referral to Gynecology     Endocrine   Dyslipidemia associated  with type 2 diabetes mellitus ([place])    Stable.  Diet and nutrition discussed. Continue atorvastatin 20 mg daily. The 10-year ASCVD risk score (Arnett DK, et al., 2019) is: 8.8%   Values used to calculate the score:     Age: 46 years     Sex: Female     Is Non-Hispanic African American: No     Diabetic: Yes     Tobacco smoker: No     Systolic Blood Pressure: 161 mmHg     Is BP treated: Yes     HDL Cholesterol:  44.9 mg/dL     Total Cholesterol: 169 mg/dL       Relevant Medications   atorvastatin (LIPITOR) 20 MG tablet   Other Visit Diagnoses     Type 2 diabetes mellitus with other specified complication, without long-term current use of insulin (HCC)    -  Primary   Relevant Medications   atorvastatin (LIPITOR) 20 MG tablet   Other Relevant Orders   POCT HgB A1C (Completed)      Patient Instructions  Diabetes mellitus y nutricin, en adultos Diabetes Mellitus and Nutrition, Adult Si sufre de diabetes, o diabetes mellitus, es muy importante tener hbitos alimenticios saludables debido a que sus niveles de Designer, television/film set sangre (glucosa) se ven afectados en gran medida por lo que come y bebe. [place] alimentos saludables en las cantidades correctas, aproximadamente a la misma hora todos los [place], [place] ayudar a: Chief Technology Officer su glucemia. Disminuir el riesgo de sufrir una enfermedad cardaca. Mejorar la presin arterial. Science writer o mantener un peso saludable. Qu puede afectar mi plan de alimentacin? Todas las personas que sufren de diabetes son diferentes y cada una tiene necesidades diferentes en cuanto a un plan de alimentacin. El mdico puede recomendarle que trabaje con un nutricionista para elaborar el mejor plan para usted. Su plan de alimentacin puede variar segn factores como: Las caloras que necesita. Los medicamentos que toma. Su peso. Sus niveles de glucemia, presin arterial y colesterol. Su nivel de [place]. Otras afecciones que tenga, como enfermedades cardacas  o renales. Cmo me afectan los carbohidratos? Los carbohidratos, o hidratos de carbono, afectan su nivel de glucemia ms que cualquier otro tipo de alimento. La ingesta de carbohidratos aumenta la cantidad de [employer]. Es importante conocer la cantidad de carbohidratos que se pueden ingerir en cada comida sin correr Engineer, manufacturing. Esto es Psychologist, forensic. Su nutricionista puede ayudarlo a calcular la cantidad de carbohidratos que debe ingerir en cada comida y en cada refrigerio. Cmo me afecta el alcohol? El alcohol puede provocar una disminucin de la glucemia (hipoglucemia), especialmente si [place] insulina o toma determinados medicamentos por va oral para la diabetes. La hipoglucemia es una afeccin potencialmente mortal. Los sntomas de la hipoglucemia, como somnolencia, mareos y confusin, son similares a los sntomas de haber consumido demasiado alcohol. No beba alcohol si: Su mdico le indica no hacerlo. Est embarazada, puede estar embarazada o est tratando de [place]. Si bebe alcohol: Limite la cantidad que bebe a lo siguiente: De 0 a 1 medida por da para las mujeres. De 0 a 2 medidas por da para los hombres. Sepa cunta cantidad de alcohol hay en las bebidas que toma. En los [place], una medida equivale a una botella de cerveza de 12 oz (355 ml), un vaso de vino de 5 oz (148 ml) o un vaso de una bebida alcohlica de alta graduacin de 1 oz (44 ml). Mantngase hidratado bebiendo agua, refrescos dietticos o t helado sin azcar. Tenga en cuenta que los refrescos comunes, los jugos y otras bebidas para mezclar pueden contener Freight forwarder y se deben contar como carbohidratos. Consejos para seguir Company secretary las etiquetas de los alimentos Comience por leer el tamao de la porcin en la etiqueta de Informacin nutricional de los alimentos envasados y las bebidas. La cantidad de caloras, carbohidratos, grasas y otros nutrientes detallados en la  etiqueta se basan en una porcin del alimento. Muchos alimentos contienen ms de una porcin por envase. Verifique la cantidad total de  gramos (g) de carbohidratos totales en una porcin. Verifique la cantidad de gramos de grasas saturadas y grasas trans en una porcin. Escoja alimentos que no contengan estas grasas o que su contenido de estas sea [place]. Verifique la cantidad de miligramos (mg) de sal (sodio) en una porcin. [place] [employer] de las personas deben limitar la ingesta de sodio total a menos de 2300 mg [employer]. Siempre consulte la informacin nutricional de los alimentos etiquetados como "con bajo contenido de grasa" o "sin grasa". Estos alimentos pueden tener un mayor contenido de Location manager agregada o carbohidratos refinados, y deben evitarse. Hable con su nutricionista para identificar sus objetivos diarios en cuanto a los nutrientes mencionados en la etiqueta. Al ir de compras Evite comprar alimentos procesados, enlatados o precocidos. Estos alimentos tienden a Special educational needs teacher mayor cantidad de [place], sodio y azcar agregada. Compre en la zona exterior de la tienda de comestibles. Esta es la zona donde se encuentran con mayor frecuencia las frutas y las verduras frescas, los cereales a granel, las carnes frescas y los productos lcteos frescos. Al cocinar Use mtodos de coccin a baja temperatura, como hornear, en lugar de mtodos de coccin a alta temperatura, como frer en abundante aceite. Cocine con aceites saludables, como el aceite de [place], canola o [place]. Evite cocinar con manteca, crema o carnes con alto contenido de grasa. Planificacin de las comidas Coma las comidas y los refrigerios regularmente, preferentemente a la misma hora todos [place]. Evite pasar largos perodos de tiempo sin comer. Consuma alimentos ricos en fibra, como frutas frescas, verduras, frijoles y cereales integrales. Consuma entre 4 y 6 onzas (entre 112 y 168 g) de protenas magras por da, como carnes [place],  pollo, pescado, huevos o tofu. Una onza (oz) (28 g) de protena magra equivale a: 1 onza (28 g) de carne, pollo o pescado. 1 huevo.  taza (62 g) de tofu. Coma algunos alimentos por da que contengan grasas saludables, como aguacates, frutos secos, semillas y pescado. Qu alimentos debo comer? Lambert Mody Bayas. Manzanas. Naranjas. Duraznos. Damascos. Ciruelas. Uvas. Mangos. Papayas. Granadas. Kiwi. Cerezas. Verduras Verduras de [employer], que incluyen [place], [place], col rizada, acelga, hojas de berza, hojas de mostaza y repollo. Remolachas. Coliflor. Brcoli. Zanahorias. Judas verdes. Tomates. Pimientos. Cebollas. Pepinos. Coles de Bruselas. Granos Granos integrales, como panes, galletas, tortillas, cereales y pastas de salvado o integrales. Avena sin azcar. Quinua. Arroz integral o salvaje. Carnes y otras protenas Frutos de mar. Carne de ave sin piel. Cortes magros de ave y carne de res. Tofu. Frutos secos. Semillas. Lcteos Productos lcteos sin grasa o con bajo contenido de [place], [place], yogur y [place]. Es posible que los productos detallados arriba no constituyan una lista completa de los alimentos y las bebidas que puede tomar. Consulte a un nutricionista para obtener ms informacin. Qu alimentos debo evitar? Lambert Mody Frutas enlatadas al almbar. Verduras Verduras enlatadas. Verduras congeladas con mantequilla o salsa de crema. Granos Productos elaborados con [place] y [place], como panes, pastas, bocadillos y cereales. Evite todos los alimentos procesados. Carnes y otras protenas Cortes de carne con alto contenido de Lobbyist. Carne de ave con piel. Carnes empanizadas o fritas. Carne procesada. Evite las grasas saturadas. Lcteos Yogur, [place] enteros. Bebidas Bebidas azucaradas, como gaseosas o t helado. Es posible que los productos que se enumeran ms [place] no constituyan una lista completa de los alimentos y las bebidas que Nurse, adult. Consulte a  un nutricionista para obtener ms informacin. Preguntas para hacerle al mdico  Debo consultar con un especialista certificado en atencin y educacin sobre la diabetes? Es necesario que me rena con un nutricionista? A qu nmero puedo llamar si tengo preguntas? Cules son los mejores momentos para controlar la glucemia? Dnde encontrar ms informacin: American Diabetes Association (Asociacin Estadounidense de la Diabetes): diabetes.org Academy of Nutrition and Dietetics (Academia de Nutricin y Information systems manager): eatright.[employer]ys Corporation of Diabetes and Digestive and Kidney Diseases ([place] la Diabetes y [place] y Renales): AmenCredit.is Association of Diabetes Care & Education Specialists (Asociacin de Especialistas en Atencin y Educacin sobre la Diabetes): diabeteseducator.org Resumen Es importante tener hbitos alimenticios saludables debido a que sus niveles de Designer, television/film set sangre (glucosa) se ven afectados en gran medida por lo que come y bebe. Es importante consumir alcohol con prudencia. Un plan de comidas saludable lo ayudar a controlar la glucosa en sangre y a reducir el riesgo de enfermedades cardacas. El mdico puede recomendarle que trabaje con un nutricionista para elaborar el mejor plan para usted. Esta informacin no tiene Marine scientist el consejo del mdico. Asegrese de hacerle al mdico cualquier pregunta que tenga. Document Revised: 07/12/2020 Document Reviewed: 07/12/2020 Elsevier Patient Education  [place], MD [facility]

## 2022-07-23 NOTE — Assessment & Plan Note (Signed)
Stable.  Diet and nutrition discussed. Continue atorvastatin 20 mg daily. The 10-year ASCVD risk score (Arnett DK, et al., 2019) is: 8.8%   Values used to calculate the score:     Age: 61 years     Sex: Female     Is Non-Hispanic African American: No     Diabetic: Yes     Tobacco smoker: No     Systolic Blood Pressure: 883 mmHg     Is BP treated: Yes     HDL Cholesterol: 44.9 mg/dL     Total Cholesterol: 169 mg/dL

## 2022-07-23 NOTE — Patient Instructions (Signed)
Diabetes mellitus y nutricin, en adultos Diabetes Mellitus and Nutrition, Adult Si sufre de diabetes, o diabetes mellitus, es muy importante tener hbitos alimenticios saludables debido a que sus niveles de azcar en la sangre (glucosa) se ven afectados en gran medida por lo que come y bebe. Comer alimentos saludables en las cantidades correctas, aproximadamente a la misma hora todos los das, lo ayudar a: Controlar su glucemia. Disminuir el riesgo de sufrir una enfermedad cardaca. Mejorar la presin arterial. Alcanzar o mantener un peso saludable. Qu puede afectar mi plan de alimentacin? Todas las personas que sufren de diabetes son diferentes y cada una tiene necesidades diferentes en cuanto a un plan de alimentacin. El mdico puede recomendarle que trabaje con un nutricionista para elaborar el mejor plan para usted. Su plan de alimentacin puede variar segn factores como: Las caloras que necesita. Los medicamentos que toma. Su peso. Sus niveles de glucemia, presin arterial y colesterol. Su nivel de actividad. Otras afecciones que tenga, como enfermedades cardacas o renales. Cmo me afectan los carbohidratos? Los carbohidratos, o hidratos de carbono, afectan su nivel de glucemia ms que cualquier otro tipo de alimento. La ingesta de carbohidratos aumenta la cantidad de glucosa en la sangre. Es importante conocer la cantidad de carbohidratos que se pueden ingerir en cada comida sin correr ningn riesgo. Esto es diferente en cada persona. Su nutricionista puede ayudarlo a calcular la cantidad de carbohidratos que debe ingerir en cada comida y en cada refrigerio. Cmo me afecta el alcohol? El alcohol puede provocar una disminucin de la glucemia (hipoglucemia), especialmente si usa insulina o toma determinados medicamentos por va oral para la diabetes. La hipoglucemia es una afeccin potencialmente mortal. Los sntomas de la hipoglucemia, como somnolencia, mareos y confusin, son  similares a los sntomas de haber consumido demasiado alcohol. No beba alcohol si: Su mdico le indica no hacerlo. Est embarazada, puede estar embarazada o est tratando de quedar embarazada. Si bebe alcohol: Limite la cantidad que bebe a lo siguiente: De 0 a 1 medida por da para las mujeres. De 0 a 2 medidas por da para los hombres. Sepa cunta cantidad de alcohol hay en las bebidas que toma. En los [place], una medida equivale a una botella de cerveza de 12 oz (355 ml), un vaso de vino de 5 oz (148 ml) o un vaso de una bebida alcohlica de alta graduacin de 1 oz (44 ml). Mantngase hidratado bebiendo agua, refrescos dietticos o t helado sin azcar. Tenga en cuenta que los refrescos comunes, los jugos y otras bebidas para mezclar pueden contener mucha azcar y se deben contar como carbohidratos. Consejos para seguir este plan  Leer las etiquetas de los alimentos Comience por leer el tamao de la porcin en la etiqueta de Informacin nutricional de los alimentos envasados y las bebidas. La cantidad de caloras, carbohidratos, grasas y otros nutrientes detallados en la etiqueta se basan en una porcin del alimento. Muchos alimentos contienen ms de una porcin por envase. Verifique la cantidad total de gramos (g) de carbohidratos totales en una porcin. Verifique la cantidad de gramos de grasas saturadas y grasas trans en una porcin. Escoja alimentos que no contengan estas grasas o que su contenido de estas sea bajo. Verifique la cantidad de miligramos (mg) de sal (sodio) en una porcin. La mayora de las personas deben limitar la ingesta de sodio total a menos de 2300 mg por da. Siempre consulte la informacin nutricional de los alimentos etiquetados como "con bajo contenido de grasa" o "sin grasa".   Estos alimentos pueden tener un mayor contenido de azcar agregada o carbohidratos refinados, y deben evitarse. Hable con su nutricionista para identificar sus objetivos diarios en  cuanto a los nutrientes mencionados en la etiqueta. Al ir de compras Evite comprar alimentos procesados, enlatados o precocidos. Estos alimentos tienden a tener una mayor cantidad de grasa, sodio y azcar agregada. Compre en la zona exterior de la tienda de comestibles. Esta es la zona donde se encuentran con mayor frecuencia las frutas y las verduras frescas, los cereales a granel, las carnes frescas y los productos lcteos frescos. Al cocinar Use mtodos de coccin a baja temperatura, como hornear, en lugar de mtodos de coccin a alta temperatura, como frer en abundante aceite. Cocine con aceites saludables, como el aceite de oliva, canola o girasol. Evite cocinar con manteca, crema o carnes con alto contenido de grasa. Planificacin de las comidas Coma las comidas y los refrigerios regularmente, preferentemente a la misma hora todos los das. Evite pasar largos perodos de tiempo sin comer. Consuma alimentos ricos en fibra, como frutas frescas, verduras, frijoles y cereales integrales. Consuma entre 4 y 6 onzas (entre 112 y 168 g) de protenas magras por da, como carnes magras, pollo, pescado, huevos o tofu. Una onza (oz) (28 g) de protena magra equivale a: 1 onza (28 g) de carne, pollo o pescado. 1 huevo.  taza (62 g) de tofu. Coma algunos alimentos por da que contengan grasas saludables, como aguacates, frutos secos, semillas y pescado. Qu alimentos debo comer? Frutas Bay[place]. Manzanas. Naranjas. Duraznos. Damascos. Ciruelas. Uvas. Mangos. Papayas. Granadas. Kiwi. Cerezas. Verduras Verduras de hoja verde, que incluyen lechuga, espinaca, col rizada, acelga, hojas de berza, hojas de mostaza y repollo. Remolachas. Coliflor. Brcoli. Zanahorias. Judas verdes. Tomates. Pimientos. Cebollas. Pepinos. Coles de Bruselas. Granos Granos integrales, como panes, galletas, tortillas, cereales y pastas de salvado o integrales. Avena sin azcar. Quinua. Arroz integral o salvaje. Carnes y otras  protenas Frutos de mar. Carne de ave sin piel. Cortes magros de ave y carne de res. Tofu. Frutos secos. Semillas. Lcteos Productos lcteos sin grasa o con bajo contenido de grasa, como leche, yogur y queso. Es posible que los productos detallados arriba no constituyan una lista completa de los alimentos y las bebidas que puede tomar. Consulte a un nutricionista para obtener ms informacin. Qu alimentos debo evitar? Frutas Frutas enlatadas al almbar. Verduras Verduras enlatadas. Verduras congeladas con mantequilla o salsa de crema. Granos Productos elaborados con harina y harina blanca refinada, como panes, pastas, bocadillos y cereales. Evite todos los alimentos procesados. Carnes y otras protenas Cortes de carne con alto contenido de grasa. Carne de ave con piel. Carnes empanizadas o fritas. Carne procesada. Evite las grasas saturadas. Lcteos Yogur, queso o leche enteros. Bebidas Bebidas azucaradas, como gaseosas o t helado. Es posible que los productos que se enumeran ms arriba no constituyan una lista completa de los alimentos y las bebidas que debe evitar. Consulte a un nutricionista para obtener ms informacin. Preguntas para hacerle al mdico Debo consultar con un especialista certificado en atencin y educacin sobre la diabetes? Es necesario que me rena con un nutricionista? A qu nmero puedo llamar si tengo preguntas? Cules son los mejores momentos para controlar la glucemia? Dnde encontrar ms informacin: American Diabetes Association (Asociacin Estadounidense de la Diabetes): diabetes.org Academy of Nutrition and Dietetics (Academia de Nutricin y Diettica): eatright.org National Institute of Diabetes and Digestive and Kidney Diseases (Instituto Nacional de la Diabetes y las Enfermedades Digestivas y Renales): niddk.nih.gov Association of Diabetes   Care & Education Specialists (Asociacin de Especialistas en Atencin y Educacin sobre la Diabetes):  diabeteseducator.org Resumen Es importante tener hbitos alimenticios saludables debido a que sus niveles de azcar en la sangre (glucosa) se ven afectados en gran medida por lo que come y bebe. Es importante consumir alcohol con prudencia. Un plan de comidas saludable lo ayudar a controlar la glucosa en sangre y a reducir el riesgo de enfermedades cardacas. El mdico puede recomendarle que trabaje con un nutricionista para elaborar el mejor plan para usted. Esta informacin no tiene como fin reemplazar el consejo del mdico. Asegrese de hacerle al mdico cualquier pregunta que tenga. Document Revised: 07/12/2020 Document Reviewed: 07/12/2020 Elsevier Patient Education  2023 Elsevier Inc.  

## 2022-07-23 NOTE — Assessment & Plan Note (Signed)
Well-controlled hypertension. Continue amlodipine 5 mg daily. Well-controlled diabetes with hemoglobin A1c of 6.6. Cardiovascular risks associated with hypertension and diabetes discussed. Diet and nutrition discussed. BP Readings from Last 3 Encounters:  07/23/22 124/82  04/22/22 136/80  06/20/21 126/60   Lab Results  Component Value Date   HGBA1C 6.6 (A) 07/23/2022

## 2022-10-29 ENCOUNTER — Encounter: Payer: Self-pay | Admitting: Internal Medicine

## 2022-10-29 ENCOUNTER — Ambulatory Visit (INDEPENDENT_AMBULATORY_CARE_PROVIDER_SITE_OTHER): Payer: Commercial Managed Care - HMO | Admitting: Internal Medicine

## 2022-10-29 VITALS — BP 130/80 | HR 65 | Temp 98.1°F | Wt 170.5 lb

## 2022-10-29 DIAGNOSIS — E1159 Type 2 diabetes mellitus with other circulatory complications: Secondary | ICD-10-CM | POA: Diagnosis not present

## 2022-10-29 DIAGNOSIS — E669 Obesity, unspecified: Secondary | ICD-10-CM | POA: Diagnosis not present

## 2022-10-29 DIAGNOSIS — E1169 Type 2 diabetes mellitus with other specified complication: Secondary | ICD-10-CM | POA: Diagnosis not present

## 2022-10-29 DIAGNOSIS — E785 Hyperlipidemia, unspecified: Secondary | ICD-10-CM

## 2022-10-29 DIAGNOSIS — I152 Hypertension secondary to endocrine disorders: Secondary | ICD-10-CM

## 2022-10-29 DIAGNOSIS — R739 Hyperglycemia, unspecified: Secondary | ICD-10-CM

## 2022-10-29 LAB — POCT GLYCOSYLATED HEMOGLOBIN (HGB A1C): Hemoglobin A1C: 6.5 % — AB (ref 4.0–5.6)

## 2022-10-29 MED ORDER — AMLODIPINE BESYLATE 5 MG PO TABS: 5.0000 mg | ORAL_TABLET | Freq: Every day | ORAL | 1 refills | Status: AC

## 2022-10-29 MED ORDER — ATORVASTATIN CALCIUM 20 MG PO TABS: 20.0000 mg | ORAL_TABLET | Freq: Every day | ORAL | 1 refills | Status: AC

## 2022-10-29 NOTE — Progress Notes (Signed)
Established Patient Office Visit     CC/Reason for Visit: Establish care, discuss chronic conditions, medication refills  HPI: Dawn Espinoza is a 61 y.o. female who is coming in today for the above mentioned reasons. Past Medical History is significant for: Hypertension, type 2 diabetes, hyperlipidemia, obesity.  She is originally from [place], lives here with a son, she does not smoke, she does not drink, she has no known drug allergies, her past surgical history is significant for a left knee replacement.  She has no family history of significance.  She wishes to establish care with me.  She is on amlodipine and atorvastatin.  Her diabetes is diet controlled.  She feels well and has no acute concerns or complaints.   Past Medical/Surgical History: Past Medical History:  Diagnosis Date   Hepatic steatosis    HLD (hyperlipidemia)    Hypertension    Internal hemorrhoids     Past Surgical History:  Procedure Laterality Date   NO PAST SURGERIES      Social History:  reports that she has never smoked. She has never used smokeless tobacco. She reports that she does not drink alcohol and does not use drugs.  Allergies: No Known Allergies  Family History:  Family History  Problem Relation Age of Onset   Hypertension Mother    Alcohol abuse Father    Heart disease Father      Current Outpatient Medications:    amLODipine (NORVASC) 5 MG tablet, Take 1 tablet (5 mg total) by mouth daily., Disp: 90 tablet, Rfl: 1   atorvastatin (LIPITOR) 20 MG tablet, Take 1 tablet (20 mg total) by mouth daily., Disp: 90 tablet, Rfl: 1  Review of Systems:  Constitutional: Denies fever, chills, diaphoresis, appetite change and fatigue.  HEENT: Denies photophobia, eye pain, redness, hearing loss, ear pain, congestion, sore throat, rhinorrhea, sneezing, mouth sores, trouble swallowing, neck pain, neck stiffness and tinnitus.   Respiratory: Denies SOB, DOE, cough, chest tightness,  and  wheezing.   Cardiovascular: Denies chest pain, palpitations and leg swelling.  Gastrointestinal: Denies nausea, vomiting, abdominal pain, diarrhea, constipation, blood in stool and abdominal distention.  Genitourinary: Denies dysuria, urgency, frequency, hematuria, flank pain and difficulty urinating.  Endocrine: Denies: hot or cold intolerance, sweats, changes in hair or nails, polyuria, polydipsia. Musculoskeletal: Denies myalgias, back pain, joint swelling, arthralgias and gait problem.  Skin: Denies pallor, rash and wound.  Neurological: Denies dizziness, seizures, syncope, weakness, light-headedness, numbness and headaches.  Hematological: Denies adenopathy. Easy bruising, personal or family bleeding history  Psychiatric/Behavioral: Denies suicidal ideation, mood changes, confusion, nervousness, sleep disturbance and agitation    Physical Exam: Vitals:   10/29/22 1439  BP: 130/80  Pulse: 65  Temp: 98.1 F (36.7 C)  TempSrc: Oral  SpO2: 98%  Weight: 170 lb 8 oz (77.3 kg)    Body mass index is 32.22 kg/m.   Constitutional: NAD, calm, comfortable Eyes: PERRL, lids and conjunctivae normal ENMT: Mucous membranes are moist.  Respiratory: clear to auscultation bilaterally, no wheezing, no crackles. Normal respiratory effort. No accessory muscle use.  Cardiovascular: Regular rate and rhythm, no murmurs / rubs / gallops. No extremity edema.   Psychiatric: Normal judgment and insight. Alert and oriented x 3. Normal mood.    Impression and Plan:  Hyperglycemia - Plan: Urine microalbumin-creatinine with uACR, POCT glycosylated hemoglobin (Hb A1C)  Hypertension associated with diabetes ([place]) - Plan: amLODipine (NORVASC) 5 MG tablet  Dyslipidemia associated with type 2 diabetes mellitus ([place]) - Plan:  atorvastatin (LIPITOR) 20 MG tablet  Obesity (BMI 30-39.9)  -Blood pressure is fairly well-controlled. -A1c of 6.5 demonstrates excellent control even off of medication. -Check  lipids when she returns. -She declines flu vaccine today despite counseling. -She is due for Pap smear and will update at next visit.  Time spent:33 minutes reviewing chart, interviewing and examining patient and formulating plan of care.     Lelon Frohlich, MD [facility]

## 2022-11-13 ENCOUNTER — Ambulatory Visit (HOSPITAL_COMMUNITY)
Admission: EM | Admit: 2022-11-13 | Discharge: 2022-11-13 | Disposition: A | Discharge status: Home / Self Care | Payer: Commercial Managed Care - HMO | Attending: Internal Medicine | Admitting: Internal Medicine

## 2022-11-13 ENCOUNTER — Encounter (HOSPITAL_COMMUNITY): Payer: Self-pay

## 2022-11-13 DIAGNOSIS — M5441 Lumbago with sciatica, right side: Secondary | ICD-10-CM | POA: Diagnosis not present

## 2022-11-13 DIAGNOSIS — J069 Acute upper respiratory infection, unspecified: Secondary | ICD-10-CM

## 2022-11-13 DIAGNOSIS — R0981 Nasal congestion: Secondary | ICD-10-CM

## 2022-11-13 MED ORDER — KETOROLAC TROMETHAMINE 30 MG/ML IJ SOLN
INTRAMUSCULAR | Status: AC
  Filled 2022-11-13: qty 1

## 2022-11-13 MED ORDER — BENZONATATE 100 MG PO CAPS: 100.0000 mg | ORAL_CAPSULE | Freq: Three times a day (TID) | ORAL | 0 refills | Status: DC

## 2022-11-13 MED ORDER — KETOROLAC TROMETHAMINE 30 MG/ML IJ SOLN
30.0000 mg | Freq: Once | INTRAMUSCULAR | Status: AC
  Administered 2022-11-13: 30 mg via INTRAMUSCULAR

## 2022-11-13 MED ORDER — PROMETHAZINE-DM 6.25-15 MG/5ML PO SYRP: 5.0000 mL | ORAL_SOLUTION | Freq: Every evening | ORAL | 0 refills | Status: DC | PRN

## 2022-11-13 MED ORDER — CETIRIZINE HCL 10 MG PO TABS: 10.0000 mg | ORAL_TABLET | Freq: Every day | ORAL | 2 refills | Status: DC

## 2022-11-13 NOTE — Discharge Instructions (Addendum)
You have a viral upper respiratory infection.  Take cetirizine '10mg'$  every 24 hours (once daily) to help dry up nasal congestion/mucous causing your cough.  Take tessalon pearles every 8 hours as needed for cough.  Take Promethazine DM cough medication to help with your cough at nighttime so that you are able to sleep. Do not drive, drink alcohol, or go to work while taking this medication since it can make you sleepy. Only take this at nighttime.   I gave you a shot in the clinic to help with your right sided low back/hip pain. Do not take any ibuprofen or NSAID containing medicines until tomorrow. You may keep taking tylenol 1,'000mg'$  every 6 hours as needed for pain. Starting tomorrow, you may take '600mg'$  of ibuprofen over the counter.  Apply heat to your right-side 20 minutes on 20 minutes off and perform gentle range of motion exercises.   If you develop any new or worsening symptoms, please return.  If your symptoms are severe, please go to the emergency room.  Follow-up with your primary care provider for further evaluation and management of your symptoms as well as ongoing wellness visits.  I hope you feel better!

## 2022-11-13 NOTE — ED Triage Notes (Signed)
Pt reports pain to the right hip, state she struggles to bend over or down X 2 days. Pt states she has a cough and runny nose X 1 week.   Pt reports she has taken tylenol and a cough syrup states it has helped some.

## 2022-11-13 NOTE — ED Provider Notes (Signed)
Person    CSN: [id number] Arrival date & time: 11/13/22  1452      History   Chief Complaint Chief Complaint  Patient presents with   Cough   Nasal Congestion   Hip Pain    HPI Dawn Espinoza is a 61 y.o. female.   Patient presents to urgent care for evaluation of nasal congestion and cough that started 1 week ago. Denies sore throat, ear pain, headache, body aches, chills, fever, nausea, vomiting, diarrhea, abdominal pain, urinary symptoms, and dizziness. She is not a smoker and denies drug use. Works at [employer] but wears as mask.  Denies history of chronic respiratory problems.  Denies chest pain, shortness of breath, heart palpitations, and weakness.  She is tolerating food and fluids well without difficulty.  Cough is worse at nighttime.  She has not attempted use of any over-the-counter medicines prior to arrival urgent care for her cold and flu symptoms.  She would also like to be evaluated for right-sided hip/low back pain that started today and is worse with movement.  No recent falls or injuries/trauma to the area.  Denies urinary symptoms, saddle anesthesia symptoms, numbness and tingling to the bilateral lower extremities, and previous injury to the right hip.  She tried using Tylenol at home this morning to help with hip pain but states this has not helped very much.   Cough Hip Pain    Past Medical History:  Diagnosis Date   Hepatic steatosis    HLD (hyperlipidemia)    Hypertension    Internal hemorrhoids     Patient Active Problem List   Diagnosis Date Noted   Hypertension associated with diabetes ([place]) 03/15/2020   Dyslipidemia associated with type 2 diabetes mellitus ([place]) 03/15/2020   Elevated liver enzymes 09/01/2019   Hx of colonic polyps 09/01/2019   Influenza vaccination declined 09/01/2019   Arthritis of knee 07/14/2019   Fatty liver 07/14/2019   History of total left knee replacement 07/14/2019   Mixed hyperlipidemia 07/14/2019    Obesity (BMI 30-39.9) 07/14/2019    Past Surgical History:  Procedure Laterality Date   NO PAST SURGERIES      OB History     Gravida  0   Para  0   Term  0   Preterm  0   AB  0   Living         SAB  0   IAB  0   Ectopic  0   Multiple      Live Births               Home Medications    Prior to Admission medications   Medication Sig Start Date End Date Taking? Authorizing Provider  benzonatate (TESSALON) 100 MG capsule Take 1 capsule (100 mg total) by mouth every 8 (eight) hours. 11/13/22  Yes Talbot Grumbling, FNP  cetirizine (ZYRTEC) 10 MG tablet Take 1 tablet (10 mg total) by mouth daily. 11/13/22  Yes Talbot Grumbling, FNP  promethazine-dextromethorphan (PROMETHAZINE-DM) 6.25-15 MG/5ML syrup Take 5 mLs by mouth at bedtime as needed for cough. 11/13/22  Yes Talbot Grumbling, FNP  amLODipine (NORVASC) 5 MG tablet Take 1 tablet (5 mg total) by mouth daily. 10/29/22   Isaac Bliss, Rayford Halsted, MD  atorvastatin (LIPITOR) 20 MG tablet Take 1 tablet (20 mg total) by mouth daily. 10/29/22   Erline Hau, MD    Family History Family History  Problem Relation Age of Onset  Hypertension Mother    Alcohol abuse Father    Heart disease Father     Social History Social History   Tobacco Use   Smoking status: Never   Smokeless tobacco: Never  Substance Use Topics   Alcohol use: No    Alcohol/week: 0.0 standard drinks of alcohol   Drug use: No     Allergies   Patient has no known allergies.   Review of Systems Review of Systems  Respiratory:  Positive for cough.   Per HPI  Physical Exam Triage Vital Signs ED Triage Vitals  Enc Vitals Group     BP 11/13/22 1747 (!) 148/81     Pulse Rate 11/13/22 1747 64     Resp 11/13/22 1747 18     Temp 11/13/22 1747 98.1 F (36.7 C)     Temp Source 11/13/22 1747 Oral     SpO2 11/13/22 1747 98 %     Weight --      Height --      Head Circumference --      Peak Flow --       Pain Score 11/13/22 1746 7     Pain Loc --      Pain Edu? --      Excl. in [place]? --    No data found.  Updated Vital Signs BP (!) 148/81 (BP Location: Left Arm)   Pulse 64   Temp 98.1 F (36.7 C) (Oral)   Resp 18   LMP 05/25/2019   SpO2 98%   Visual Acuity Right Eye Distance:   Left Eye Distance:   Bilateral Distance:    Right Eye Near:   Left Eye Near:    Bilateral Near:     Physical Exam Vitals and nursing note reviewed.  Constitutional:      Appearance: Normal appearance. She is not ill-appearing or toxic-appearing.  HENT:     Head: Normocephalic and atraumatic.     Right Ear: Hearing, tympanic membrane, ear canal and external ear normal.     Left Ear: Hearing, tympanic membrane, ear canal and external ear normal.     Nose: Congestion present.     Mouth/Throat:     Lips: Pink.     Mouth: Mucous membranes are moist.     Pharynx: No posterior oropharyngeal erythema.  Eyes:     General: Lids are normal. Vision grossly intact. Gaze aligned appropriately.        Right eye: No discharge.        Left eye: No discharge.     Extraocular Movements: Extraocular movements intact.     Conjunctiva/sclera: Conjunctivae normal.  Cardiovascular:     Rate and Rhythm: Normal rate and regular rhythm.     Heart sounds: Normal heart sounds, S1 normal and S2 normal.  Pulmonary:     Effort: Pulmonary effort is normal. No respiratory distress.     Breath sounds: Normal breath sounds and air entry.  Abdominal:     General: Bowel sounds are normal.     Palpations: Abdomen is soft.     Tenderness: There is no abdominal tenderness. There is no right CVA tenderness, left CVA tenderness or guarding.     Comments: No peritoneal signs.  Benign abdominal exam.  Musculoskeletal:     Cervical back: Neck supple. No tenderness.     Lumbar back: Tenderness present. No swelling, edema, deformity, signs of trauma, lacerations, spasms or bony tenderness. Normal range of motion. No scoliosis.  Right lower leg: No edema.     Left lower leg: No edema.     Comments: Tender to the right-sided sciatic notch and to the right oblique muscular area.  Tender with position change from seated to standing as well as with range of motion exercises to the lumbar spine.  Decreased range of motion to the lumbar spine due to tenderness.  No midline tenderness to the C, T, or L-spine.  Lymphadenopathy:     Cervical: No cervical adenopathy.  Skin:    General: Skin is warm and dry.     Capillary Refill: Capillary refill takes less than 2 seconds.     Findings: No rash.  Neurological:     General: No focal deficit present.     Mental Status: She is alert and oriented to person, place, and time. Mental status is at baseline.     Cranial Nerves: No dysarthria or facial asymmetry.     Motor: No weakness.     Comments: 5/5 strength with dorsi flexion and plantarflexion of the bilateral lower extremities.  Sensation intact.  Psychiatric:        Mood and Affect: Mood normal.        Speech: Speech normal.        Behavior: Behavior normal.        Thought Content: Thought content normal.        Judgment: Judgment normal.      UC Treatments / Results  Labs (all labs ordered are listed, but only abnormal results are displayed) Labs Reviewed - No data to display  EKG   Radiology No results found.  Procedures Procedures (including critical care time)  Medications Ordered in UC Medications  ketorolac (TORADOL) 30 MG/ML injection 30 mg (30 mg Intramuscular Given 11/13/22 1829)    Initial Impression / Assessment and Plan / UC Course  I have reviewed the triage vital signs and the nursing notes.  Pertinent labs & imaging results that were available during my care of the patient were reviewed by me and considered in my medical decision making (see chart for details).   1.  Acute right-sided low back pain with right-sided sciatica Symptoms just started today and patient has not attempted use of  over-the-counter NSAID therapy at home.  Therefore, we will treat this with ketorolac injection 30 mg for acute pain and she may begin taking ibuprofen 600 mg every 6 hours as needed for pain and inflammation to the area starting tomorrow.  Heat and gentle range of motion exercises recommended.  Deferred imaging based on stable musculoskeletal exam findings and atraumatic mechanism of pain/injury.  She is agreeable with this plan.  May follow-up with PCP if symptoms fail to improve in the next 3 to 4 days.  She is not exhibiting any red flag signs or symptoms indicating need for emergency referral to the emergency department for further workup or evaluation.  2.  Viral URI with cough, nasal congestion Symptoms and physical exam consistent with a viral upper respiratory tract infection that will likely resolve with rest, fluids, and prescriptions for symptomatic relief. No indication for imaging today based on stable cardiopulmonary exam and hemodynamically stable vital signs.  Deferred viral testing due to timing of illness.  Tessalon Perles, cetirizine, and Promethazine DM sent to pharmacy for symptomatic relief to be taken as prescribed. Promethazine DM cough medication may be used as needed only at bedtime due to possible drowsiness side effect (no alcohol, working, or driving while taking this advised). May  use ibuprofen/tylenol over the counter for body aches, fever/chills, and overall discomfort associated with viral illness. Nonpharmacologic interventions for symptom relief provided and after visit summary below.   Strict ED/urgent care return precautions given.  Patient verbalizes understanding and agreement with plan.  Counseled patient regarding possible side effects and uses of all medications prescribed at today's visit.  Patient verbalizes understanding and agreement with plan.  All questions answered.  Patient discharged from urgent care in stable condition.        Final Clinical  Impressions(s) / UC Diagnoses   Final diagnoses:  Acute right-sided low back pain with right-sided sciatica  Nasal congestion  Viral URI with cough     Discharge Instructions      You have a viral upper respiratory infection.  Take cetirizine '10mg'$  every 24 hours (once daily) to help dry up nasal congestion/mucous causing your cough.  Take tessalon pearles every 8 hours as needed for cough.  Take Promethazine DM cough medication to help with your cough at nighttime so that you are able to sleep. Do not drive, drink alcohol, or go to work while taking this medication since it can make you sleepy. Only take this at nighttime.   I gave you a shot in the clinic to help with your right sided low back/hip pain. Do not take any ibuprofen or NSAID containing medicines until tomorrow. You may keep taking tylenol 1,'000mg'$  every 6 hours as needed for pain. Starting tomorrow, you may take '600mg'$  of ibuprofen over the counter.  Apply heat to your right-side 20 minutes on 20 minutes off and perform gentle range of motion exercises.   If you develop any new or worsening symptoms, please return.  If your symptoms are severe, please go to the emergency room.  Follow-up with your primary care provider for further evaluation and management of your symptoms as well as ongoing wellness visits.  I hope you feel better!      ED Prescriptions     Medication Sig Dispense Auth. Provider   benzonatate (TESSALON) 100 MG capsule Take 1 capsule (100 mg total) by mouth every 8 (eight) hours. 21 capsule Talbot Grumbling, FNP   promethazine-dextromethorphan (PROMETHAZINE-DM) 6.25-15 MG/5ML syrup Take 5 mLs by mouth at bedtime as needed for cough. 118 mL Joella Prince M, FNP   cetirizine (ZYRTEC) 10 MG tablet Take 1 tablet (10 mg total) by mouth daily. 30 tablet Talbot Grumbling, FNP      PDMP not reviewed this encounter.   Talbot Grumbling, [place] 11/13/22 Bosie Helper

## 2022-11-15 ENCOUNTER — Emergency Department (HOSPITAL_COMMUNITY): Payer: Commercial Managed Care - HMO

## 2022-11-15 ENCOUNTER — Other Ambulatory Visit: Payer: Self-pay

## 2022-11-15 ENCOUNTER — Emergency Department (HOSPITAL_COMMUNITY)
Admission: EM | Admit: 2022-11-15 | Discharge: 2022-11-15 | Disposition: A | Discharge status: Home / Self Care | Payer: Commercial Managed Care - HMO | Attending: Emergency Medicine | Admitting: Emergency Medicine

## 2022-11-15 ENCOUNTER — Encounter (HOSPITAL_COMMUNITY): Payer: Self-pay | Admitting: Emergency Medicine

## 2022-11-15 DIAGNOSIS — D72829 Elevated white blood cell count, unspecified: Secondary | ICD-10-CM | POA: Insufficient documentation

## 2022-11-15 DIAGNOSIS — R8279 Other abnormal findings on microbiological examination of urine: Secondary | ICD-10-CM | POA: Insufficient documentation

## 2022-11-15 DIAGNOSIS — M545 Low back pain, unspecified: Secondary | ICD-10-CM | POA: Insufficient documentation

## 2022-11-15 LAB — URINALYSIS, ROUTINE W REFLEX MICROSCOPIC
Bilirubin Urine: NEGATIVE
Glucose, UA: NEGATIVE mg/dL
Ketones, ur: NEGATIVE mg/dL
Nitrite: NEGATIVE
Protein, ur: NEGATIVE mg/dL
Specific Gravity, Urine: 1.016 (ref 1.005–1.030)
pH: 5 (ref 5.0–8.0)

## 2022-11-15 MED ORDER — METHOCARBAMOL 500 MG PO TABS
1000.0000 mg | ORAL_TABLET | Freq: Once | ORAL | Status: AC
  Administered 2022-11-15: 1000 mg via ORAL
  Filled 2022-11-15: qty 2

## 2022-11-15 MED ORDER — LIDOCAINE 5 % EX PTCH: 1.0000 | MEDICATED_PATCH | CUTANEOUS | 0 refills | Status: DC

## 2022-11-15 MED ORDER — IBUPROFEN 400 MG PO TABS
600.0000 mg | ORAL_TABLET | Freq: Once | ORAL | Status: AC
  Administered 2022-11-15: 600 mg via ORAL
  Filled 2022-11-15: qty 1

## 2022-11-15 MED ORDER — LIDOCAINE 5 % EX PTCH
1.0000 | MEDICATED_PATCH | Freq: Once | CUTANEOUS | Status: DC
  Administered 2022-11-15: 1 via TRANSDERMAL
  Filled 2022-11-15: qty 1

## 2022-11-15 MED ORDER — METHOCARBAMOL 1000 MG PO TABS: 1000.0000 mg | ORAL_TABLET | Freq: Three times a day (TID) | ORAL | 0 refills | Status: DC | PRN

## 2022-11-15 NOTE — Discharge Instructions (Addendum)
Continue to take Tylenol and Ibuprofen for Pain. You are also being prescribed a muscle relaxer and a numbing patch.    If you develop worsening, recurrent, or continued back pain, numbness or weakness in the legs, incontinence of your bowels or bladders, numbness of your buttocks, fever, abdominal pain, or any other new/concerning symptoms then return to the ER for evaluation.    Contine tomando Tylenol e ibuprofeno para el dolor. Tambin le recetan un relajante muscular y un parche anestsico.  Si presenta dolor de espalda que empeora, recurrente o continuo, entumecimiento o debilidad en las piernas, incontinencia intestinal o vesical, entumecimiento de las nalgas, fiebre, dolor abdominal o cualquier otro sntoma nuevo o preocupante, regrese a la sala de emergencias para una evaluacin. Marland Kitchen

## 2022-11-15 NOTE — ED Provider Notes (Signed)
[facility]PART[facility] Provider Note   CSN: [id number] Arrival date & time: 11/15/22  0820     History  No chief complaint on file.   Dawn Espinoza is a 61 y.o. female.  HPI 61 year old female presents with atraumatic right low back pain.  History is taken with the Spanish Interpreter. Symptoms started 2 days ago when she first woke up.  She recently had a URI and so she had fevers then but since then has not had any fevers.  She denies bowel/bladder incontinence or weakness/numbness in the legs.  No radiation of the pain into her legs or into her abdomen.  No abdominal pain.  No dysuria but she states her urine has been darker yellow she is worried about potential infection.  No trauma.  Pain is worst when she bends or twists a certain way.  She was given a Toradol shot at urgent care 2 days ago but it did not help.  She has been taking Tylenol and ibuprofen without relief.  Home Medications Prior to Admission medications   Medication Sig Start Date End Date Taking? Authorizing Provider  lidocaine (LIDODERM) 5 % Place 1 patch onto the skin daily. Remove & Discard patch within 12 hours or as directed by MD 11/15/22  Yes Sherwood Gambler, MD  methocarbamol 1000 MG TABS Take 1,000 mg by mouth every 8 (eight) hours as needed for muscle spasms. 11/15/22  Yes Sherwood Gambler, MD  amLODipine (NORVASC) 5 MG tablet Take 1 tablet (5 mg total) by mouth daily. 10/29/22   Isaac Bliss, Rayford Halsted, MD  atorvastatin (LIPITOR) 20 MG tablet Take 1 tablet (20 mg total) by mouth daily. 10/29/22   Isaac Bliss, Rayford Halsted, MD  benzonatate (TESSALON) 100 MG capsule Take 1 capsule (100 mg total) by mouth every 8 (eight) hours. 11/13/22   Talbot Grumbling, FNP  cetirizine (ZYRTEC) 10 MG tablet Take 1 tablet (10 mg total) by mouth daily. 11/13/22   Talbot Grumbling, FNP  promethazine-dextromethorphan (PROMETHAZINE-DM) 6.25-15 MG/5ML syrup Take 5 mLs by mouth at bedtime as  needed for cough. 11/13/22   Talbot Grumbling, FNP      Allergies    Patient has no known allergies.    Review of Systems   Review of Systems  Constitutional:  Negative for fever.  Gastrointestinal:  Negative for abdominal pain.  Genitourinary:  Negative for dysuria.  Musculoskeletal:  Positive for back pain.  Neurological:  Negative for weakness and numbness.    Physical Exam Updated Vital Signs BP 129/69 (BP Location: Right Arm)   Pulse 75   Temp 98 F (36.7 C) (Oral)   Resp 15   Wt 77.1 kg   LMP 05/25/2019   SpO2 99%   BMI 32.12 kg/m  Physical Exam Vitals and nursing note reviewed.  Constitutional:      General: She is not in acute distress.    Appearance: She is well-developed. She is not ill-appearing or diaphoretic.  HENT:     Head: Normocephalic and atraumatic.  Cardiovascular:     Rate and Rhythm: Normal rate and regular rhythm.     Heart sounds: Normal heart sounds.  Pulmonary:     Effort: Pulmonary effort is normal.     Breath sounds: Normal breath sounds.  Abdominal:     Palpations: Abdomen is soft.     Tenderness: There is no abdominal tenderness.  Musculoskeletal:     Lumbar back: Tenderness present.       Back:  Right hip: No tenderness. Normal range of motion.     Comments: No rash to the right back  Skin:    General: Skin is warm and dry.  Neurological:     Mental Status: She is alert.     Comments: 5/5 strength in BLE, though it induces some pain in the right back with right leg strength testing.      ED Results / Procedures / Treatments   Labs (all labs ordered are listed, but only abnormal results are displayed) Labs Reviewed  URINALYSIS, ROUTINE W REFLEX MICROSCOPIC - Abnormal; Notable for the following components:      Result Value   APPearance HAZY (*)    Hgb urine dipstick SMALL (*)    Leukocytes,Ua MODERATE (*)    Bacteria, UA RARE (*)    Non Squamous Epithelial 0-5 (*)    All other components within normal limits   URINE CULTURE    EKG None  Radiology DG Lumbar Spine Complete  Result Date: 11/15/2022 CLINICAL DATA:  Right lower back/pelvic pain. EXAM: LUMBAR SPINE - COMPLETE 4+ VIEW COMPARISON:  Lumbar spine radiographs 04/06/2018 FINDINGS: There are 5 non-rib-bearing lumbar-type vertebral bodies. Vertebral body heights are preserved, without evidence of acute injury. Alignment is normal. There is no evidence of spondylolysis. There is disc space narrowing and degenerative endplate changes in the lower thoracic spine. The lumbar disc heights are preserved. There is mild facet arthropathy at L5-S1. 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 No acute finding. Electronically Signed   By: Valetta Mole M.D.   On: 11/15/2022 11:04   DG Hip Unilat W or Wo Pelvis 2-3 Views Right  Result Date: 11/15/2022 CLINICAL DATA:  Right lower back/pelvic pain. EXAM: DG HIP (WITH OR WITHOUT PELVIS) 2-3V RIGHT COMPARISON:  None Available. FINDINGS: There is no acute fracture or dislocation. Femoroacetabular alignment is normal. There is mild degenerative change about the right hip. The SI joints and symphysis pubis are intact. The soft tissues are unremarkable. IMPRESSION: Mild degenerative change about the right hip.  No acute finding. Electronically Signed   By: Valetta Mole M.D.   On: 11/15/2022 11:01    Procedures Procedures    Medications Ordered in ED Medications  lidocaine (LIDODERM) 5 % 1 patch (1 patch Transdermal Patch Applied 11/15/22 1104)  ibuprofen (ADVIL) tablet 600 mg (600 mg Oral Given 11/15/22 1104)  methocarbamol (ROBAXIN) tablet 1,000 mg (1,000 mg Oral Given 11/15/22 1104)    ED Course/ Medical Decision Making/ A&P                           Medical Decision Making Amount and/or Complexity of Data Reviewed External Data Reviewed: notes. Labs: ordered.    Details: Small amount of hemoglobin in the urine though this has been present on previous  urinalysis.  Contaminated urine with some leukocytes but rare bacteria, likely not UTI. Radiology: ordered and independent interpretation performed.    Details: No hip fracture or lumbar fracture.  No pelvic fracture.  Risk Prescription drug management.   Based on presentation this is likely muscular back pain.  My suspicion for renal colic is low given the location of pain.  Is also worse with certain movements especially twisting and bending.  No neurodeficits or concern for cauda equina at this time.  She was given NSAIDs, Lidoderm and Robaxin here and is feeling better.  I discussed her workup and planned through the  interpreter.  Patient will need to follow-up with PCP and she will be given some exercises to try as well as muscle relaxers and Lidoderm.  Otherwise, doubt renal emergency, AAA, or other intra-abdominal/retroperitoneal emergency.  Will have her follow-up with PCP and will give return precautions.        Final Clinical Impression(s) / ED Diagnoses Final diagnoses:  Acute right-sided low back pain without sciatica    Rx / DC Orders ED Discharge Orders          Ordered    lidocaine (LIDODERM) 5 %  Every 24 hours        11/15/22 1212    methocarbamol 1000 MG TABS  Every 8 hours PRN        11/15/22 1212              Sherwood Gambler, MD 11/15/22 1432

## 2022-11-15 NOTE — ED Triage Notes (Signed)
Pt//translator stated, I have hip pain on the right side started, on Wednesday. Im unable to bend , I woke up feeling the pain. I had an injection on Wednesday at the [facility].

## 2022-11-17 LAB — URINE CULTURE

## 2023-01-21 ENCOUNTER — Ambulatory Visit: Payer: 59 | Admitting: Emergency Medicine

## 2023-03-03 ENCOUNTER — Encounter: Payer: 59 | Admitting: Internal Medicine

## 2023-03-08 ENCOUNTER — Ambulatory Visit (HOSPITAL_COMMUNITY)
Admission: EM | Admit: 2023-03-08 | Discharge: 2023-03-08 | Disposition: A | Discharge status: Home / Self Care | Payer: BLUE CROSS/BLUE SHIELD | Attending: Physician Assistant | Admitting: Physician Assistant

## 2023-03-08 ENCOUNTER — Encounter (HOSPITAL_COMMUNITY): Payer: Self-pay

## 2023-03-08 DIAGNOSIS — L234 Allergic contact dermatitis due to dyes: Secondary | ICD-10-CM | POA: Diagnosis not present

## 2023-03-08 DIAGNOSIS — I10 Essential (primary) hypertension: Secondary | ICD-10-CM | POA: Diagnosis not present

## 2023-03-08 MED ORDER — CLOBETASOL PROPIONATE 0.05 % EX SHAM: 1.0000 "application " | MEDICATED_SHAMPOO | Freq: Every day | CUTANEOUS | 1 refills | Status: DC

## 2023-03-08 MED ORDER — CETIRIZINE HCL 10 MG PO TABS: 10.0000 mg | ORAL_TABLET | Freq: Every day | ORAL | 0 refills | Status: DC

## 2023-03-08 NOTE — ED Triage Notes (Signed)
Dawn Espinoza [id number]  Pt states on Thursday she went in to have xrays done on her head. She's been having burning sensation and redness on her scalp since.

## 2023-03-08 NOTE — Discharge Instructions (Addendum)
Parece que tienes una reaccin alrgica al tinte. Utilice champ de clobetasol diariamente durante la prxima semana. Comience con cetirizina por la noche para ayudar con la picazn. Evite la exposicin a tintes para el cabello. Si su sarpullido se propaga o cambia, debe ser atendido de inmediato.  Su presin arterial est elevada. Por favor tome su medicamento cuando llegue a casa. Controle esto en casa. Si desarrolla dolor en el pecho, dificultad para respirar, dolor de cabeza, cambios en la visin o mareos debido a la presin arterial alta, debe acudir a la sala de emergencias. Haga un seguimiento con su atencin primaria segn lo programado la prxima semana. Si tiene alguna dificultad para entrar con ellos, regrese aqu.

## 2023-03-08 NOTE — ED Provider Notes (Signed)
[facility]    CSN: [id number] Arrival date & time: 03/08/23  1549      History   Chief Complaint Chief Complaint  Patient presents with   scalp burn    HPI Dawn Espinoza is a 62 y.o. female.   Patient presents today with a 2-day history of painful rash on her scalp.  Reports that she went to have her hair done for the first time in a long time and believes that they used a chemical she is either allergic to or the improperly used because she has had ongoing pain and burning since that time.  She is unsure exactly what was used.  She denies any previous reactions to cosmetics or dyes.  She has not tried any over-the-counter medications but did return to the salon where they gave her a scalp oil that was ineffective.  She denies any spread of rash or additional symptoms including shortness of breath, fever, nausea, vomiting, swelling of her throat.  In addition, her blood pressure was noted to be elevated.  She does have history of hypertension has not yet taken her amlodipine today.  She denies any chest pain, shortness of breath, headache, vision change, dizziness.  She does follow with her primary care and has an appointment scheduled on Friday.    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 Authorizing Provider  amLODipine (NORVASC) 5 MG tablet Take 1 tablet (5 mg total) by mouth daily. 10/29/22  Yes Philip Aspen, Limmie Patricia, MD  atorvastatin (LIPITOR) 20 MG tablet Take 1 tablet (20 mg total) by mouth daily. 10/29/22  Yes Philip Aspen, Limmie Patricia, MD  cetirizine (ZYRTEC ALLERGY) 10 MG tablet Take 1 tablet (10 mg total) by mouth at bedtime. 03/08/23  Yes Kyriaki Moder, Denny Peon K, PA-C  Clobetasol Propionate 0.05 % shampoo Apply 1 application  topically daily. 03/08/23  Yes Evana Runnels, Noberto Retort, PA-C    Family History Family History  Problem Relation Age of Onset   Hypertension Mother    Alcohol abuse Father    Heart disease Father     Social History Social History   Tobacco Use   Smoking status: Never   Smokeless tobacco: Never  Vaping Use   Vaping Use: Never used  Substance Use Topics  Alcohol use: No    Alcohol/week: 0.0 standard drinks of alcohol   Drug use: No     Allergies   Patient has no known allergies.   Review of Systems Review of Systems  Constitutional:  Positive for activity change. Negative for appetite change, fatigue and fever.  Eyes:  Negative for visual disturbance.  Respiratory:  Negative for cough and shortness of breath.   Cardiovascular:  Negative for chest pain.  Gastrointestinal:  Negative for abdominal pain, diarrhea, nausea and vomiting.  Skin:  Positive for rash. Negative for color change.  Neurological:  Negative for dizziness, light-headedness and headaches.     Physical Exam Triage Vital Signs ED Triage Vitals  Enc Vitals Group     BP 03/08/23 1654 (!) 160/76     Pulse Rate 03/08/23 1654 75     Resp 03/08/23 1654 18     Temp 03/08/23 1654 98.9 F (37.2 C)     Temp Source 03/08/23 1654 Oral     SpO2  03/08/23 1654 98 %     Weight 03/08/23 1653 167 lb (75.8 kg)     Height --      Head Circumference --      Peak Flow --      Pain Score 03/08/23 1652 4     Pain Loc --      Pain Edu? --      Excl. in GC? --    No data found.  Updated Vital Signs BP 129/68 (BP Location: Left Arm)   Pulse 75   Temp 98.9 F (37.2 C) (Oral)   Resp 18   Wt 167 lb (75.8 kg)   LMP 05/25/2019   SpO2 98%   BMI 31.55 kg/m   Visual Acuity Right Eye Distance:   Left Eye Distance:   Bilateral Distance:    Right Eye Near:   Left Eye Near:    Bilateral Near:     Physical Exam Vitals reviewed.  Constitutional:      General: She is awake. She is not in acute distress.    Appearance: Normal appearance. She is well-developed. She is not ill-appearing.     Comments: Very pleasant female appears stated age in no acute distress sitting comfortably in exam room  HENT:     Head: Normocephalic and atraumatic.     Comments: Maculopapular rash with crusting noted scalp. Cardiovascular:     Rate and Rhythm: Normal rate and regular rhythm.     Heart sounds: Normal heart sounds, S1 normal and S2 normal. No murmur heard. Pulmonary:     Effort: Pulmonary effort is normal.     Breath sounds: Normal breath sounds. No wheezing, rhonchi or rales.     Comments: Clear to auscultation bilaterally Abdominal:     General: Bowel sounds are normal.     Palpations: Abdomen is soft.     Tenderness: There is no abdominal tenderness. There is no right CVA tenderness, left CVA tenderness, guarding or rebound.  Psychiatric:        Behavior: Behavior is cooperative.      UC Treatments / Results  Labs (all labs ordered are listed, but only abnormal results are displayed) Labs Reviewed - No data to display  EKG   Radiology No results found.  Procedures Procedures (including critical care time)  Medications Ordered in UC Medications - No data to display  Initial Impression / Assessment and Plan / UC Course  I  have reviewed the triage vital signs and the  nursing notes.  Pertinent labs & imaging results that were available during my care of the patient were reviewed by me and considered in my medical decision making (see chart for details).     Symptoms consistent with contact dermatitis.  Will use clobetasol shampoo to help manage her symptoms.  Will also start cetirizine to help with associated pruritus.  She is to use hypoallergenic soaps and detergents and avoid repeated exposure to hair dye.  Discussed that if she has any spread of rash, increasing pain, drainage or swelling consistent with infection, nausea/vomiting, swelling of her throat, shortness of breath she needs to be seen immediately.  Blood pressure was elevated today but patient denies any signs/symptoms of endorgan damage.  Recommend she take her blood pressure medication at home.  She is to monitor her blood pressure and return if it is persistently above 140/90.  Discussed that she should avoid decongestants, caffeine, sodium, NSAIDs.  She is to follow-up with her primary care as scheduled on Friday for recheck.  If she has any difficulty getting in with them she is to return here.  If she develops any chest pain, shortness of breath, headache, vision change, dizziness in the setting of high blood pressure she needs to be seen immediately.  Final Clinical Impressions(s) / UC Diagnoses   Final diagnoses:  Allergic contact dermatitis due to dyes  Elevated blood pressure reading with diagnosis of hypertension     Discharge Instructions      Parece que tienes una reaccin alrgica al tinte. Utilice champ de clobetasol diariamente durante la prxima semana. Comience con cetirizina por la noche para ayudar con la picazn. Evite la exposicin a tintes para el cabello. Si su sarpullido se propaga o cambia, debe ser atendido de inmediato.  Su presin arterial est elevada. Por favor tome su medicamento cuando llegue a casa. Controle esto  en casa. Si desarrolla dolor en el pecho, dificultad para respirar, dolor de cabeza, cambios en la visin o mareos debido a la presin arterial alta, debe acudir a la sala de emergencias. Haga un seguimiento con su atencin primaria segn lo programado la prxima semana. Si tiene alguna dificultad para entrar con ellos, regrese aqu.     ED Prescriptions     Medication Sig Dispense Auth. Provider   Clobetasol Propionate 0.05 % shampoo Apply 1 application  topically daily. 118 mL Zelena Bushong K, PA-C   cetirizine (ZYRTEC ALLERGY) 10 MG tablet Take 1 tablet (10 mg total) by mouth at bedtime. 14 tablet Nancey Kreitz, Noberto Retort, PA-C      PDMP not reviewed this encounter.   Jeani Hawking, PA-C 03/08/23 1755

## 2023-12-22 DIAGNOSIS — E782 Mixed hyperlipidemia: Secondary | ICD-10-CM | POA: Diagnosis not present

## 2023-12-22 DIAGNOSIS — M1711 Unilateral primary osteoarthritis, right knee: Secondary | ICD-10-CM | POA: Diagnosis not present

## 2023-12-22 DIAGNOSIS — E119 Type 2 diabetes mellitus without complications: Secondary | ICD-10-CM | POA: Diagnosis not present

## 2023-12-22 DIAGNOSIS — Z79899 Other long term (current) drug therapy: Secondary | ICD-10-CM | POA: Diagnosis not present

## 2023-12-22 DIAGNOSIS — M1712 Unilateral primary osteoarthritis, left knee: Secondary | ICD-10-CM | POA: Diagnosis not present

## 2023-12-22 DIAGNOSIS — Z23 Encounter for immunization: Secondary | ICD-10-CM | POA: Diagnosis not present

## 2023-12-22 DIAGNOSIS — I10 Essential (primary) hypertension: Secondary | ICD-10-CM | POA: Diagnosis not present

## 2023-12-22 DIAGNOSIS — Z791 Long term (current) use of non-steroidal anti-inflammatories (NSAID): Secondary | ICD-10-CM | POA: Diagnosis not present

## 2024-12-13 NOTE — Patient Instructions (Signed)
 DEBIDO AL COVID-19 SLO SE PERMITEN DOS VISITANTES (de 16 aos en adelante)  PARA QUE VENGAN CON USTED Y SE QUEDEN EN LA SALA DE ESPERA SOLAMENTE DURANTE EL PRE OP Y EL PROCEDIMIENTO.   **NO SE PERMITEN VISITAS EN EL REA DE CORTA ESTADA NI EN LA SALA DE RECUPERACIN!!**  SI VA A SER INGRESADO(A) AL HOSPITAL SLO SE LE PERMITEN CUATRO PERSONAS DE APOYO DURANTE LAS HORAS DE VISITA (7 AM -8PM)   La(s) persona(s) de apoyo debe(n) pasar nuestra evaluacin, entrar y salir con gel y usar la mscara en todo momento, incluso en la habitacin del paciente. Los pacientes tambin deben usar una mscara cuando el personal o su persona de apoyo estn en la habitacin. Los visitantes DEBEN LLEVAR ETIQUETA DE VISITANTE DE UNA MANERA VISIBLE. Un visitante adulto puede permanecer con usted durante la noche y DEBE estar en la habitacin a las 8 P.M.     Su procedimiento est programado en: 12/20/24   Presntese a la entrada principal del [facility]     Presntese a admisiones por la maana a las: 8:45 AM.   Llame a este nmero si tiene problemas la maana de la ciruga al [phone]   No consuma alimentos: Despus de la medianoche   Despus de la medianoche puede tomar los siguientes lquidos hasta la(s) : 8:15 AM DEL DA DE LA CIRUGA  Agua Caf negro (con azcar, SIN LECHE, NI CREMA)  T normal y descafeinado (con azcar, SIN LECHE, NI CREMA)                              Gelatina normal (NO ROJA)                                           Helados de frutas (sin pulpa. NO DE COLOR ROJO)                                     Helados de hielo (NO ROJO)                                                                  Jugo: de manzana, uva BLANCA, arndano BLANCO Bebidas deportivas como Gatorade (NO ROJAS)   SIGA CUALQUIER INSTRUCCIN ADICIONAL PREOPERATORIA QUE HAYA RECIBIDO DEL OFICINA DE DU CIRUJANO!!!   La higiene bucal tambin es importante para reducir el riesgo de infeccin.                                    Recuerde - LVESE LOS DIENTES EN LA MAANA DE LA CIRUGA CON SU PASTA DENTAL HABITUAL   NO fume despus de la medianoche   [employer] en la maana de la ciruga con UN SORBO DE AGUA: amlodipine ,atorvastantin(lipitor).                   No debe trae ningn metal en el cuerpo, incluyendo pinzas para el cabello, joyas, ni aretes/pendientes  No use maquillaje, lociones/cremas, polvos, perfumes/colonias o desodorante  No use esmalte de uas, incluyendo los de gel ni S&S, uas artificiales/acrlicas o cualquier otro tipo de cobertura en las uas naturales, incluyendo las uas de las manos y avaya. Si tiene uas artificiales, con capas de gel, etc. que necesite que le quiten en un saln de uas, por favor, pida que se lo quiten antes de la ciruga o la ciruga podra ser cancelada/retrasada si el cirujano o el anestesilogo consideran que no puede ser monitoreado(a) de una forma segura.   No se rasure en las 48 horas antes de la operacin.    No traiga objetos de valor al hospital. [place] NO SE HACE RESPONSABLE DE LOS OBJETOS DE VALOR.   Los contactos, las dentaduras o los puentes no se pueden usar durante la ciruga.   Melonie una bolsa pequea para la noche el da de la [place].   NO TRAIGA AL HOSPITAL LOS MEDICAMENTOS QUE TOMA EN CASA . LA FARMACIA LE SUMINISTRAR LOS MEDICAMENTOS QUE TENGA EN SU LISTA DE MEDICAMENTOS DURANTE SU ESTADA EN EL HOSPITAL!    Los pacientes dados de alta el mismo da de la ciruga no podrn company secretary a casa.  Es NECESARIO que alguien se quede con usted durante las primeras 24 horas despus de la anestesia.   Instrucciones especiales: Melonie query copia de sus documentos de poder notarial y testamento vital el da de su ciruga si no los ha escaneado antes.              Por favor, lea las siguientes hojas informativas que le dieron: SI TIENE PREGUNTAS SOBRE SUS INSTRUCCIONES PREOPERATORIAS POR FAVOR LLAME AL  [phone]   Pre-operative 4 CHG Bath Instructions  DYNA-Hex 4 Chlorhexidine Gluconate 4% Solution Antiseptic 4 fl. oz   You can play a key role in reducing the risk of infection after surgery. Your skin needs to be as free of germs as possible. You can reduce the number of germs on your skin by washing with CHG (chlorhexidine gluconate) soap before surgery. CHG is an antiseptic soap that kills germs and continues to kill germs even after washing.   DO NOT use if you have an allergy to chlorhexidine/CHG or antibacterial soaps. If your skin becomes reddened or irritated, stop using the CHG and notify one of our RNs at   Please shower with the CHG soap starting 4 days before surgery using the following schedule:     Please keep in mind the following:  DO NOT shave, including legs and underarms, starting the day of your first shower.   You may shave your face at any point before/day of surgery.  Place clean sheets on your bed the day you start using CHG soap. Use a clean washcloth (not used since being washed) for each shower. DO NOT sleep with pets once you start using the CHG.  CHG Shower Instructions:  If you choose to wash your hair and private area, wash first with your normal shampoo/soap.  After you use shampoo/soap, rinse your hair and body thoroughly to remove shampoo/soap residue.  Turn the water OFF and apply about 3 tablespoons (45 ml) of CHG soap to a CLEAN washcloth.  Apply CHG soap ONLY FROM YOUR NECK DOWN TO YOUR TOES (washing for 3-5 minutes)  DO NOT use CHG soap on face, private areas, open wounds, or sores.  Pay special attention to the area where your surgery is being performed.  If you are having back surgery,  having someone wash your back for you may be helpful. Wait 2 minutes after CHG soap is applied, then you may rinse off the CHG soap.  Pat dry with a clean towel  Put on clean clothes/pajamas   If you choose to wear lotion, please use ONLY the CHG-compatible  lotions on the back of this paper.     Additional instructions for the day of surgery: DO NOT APPLY any lotions, deodorants, cologne, or perfumes.   Put on clean/comfortable clothes.  Brush your teeth.  Ask your nurse before applying any prescription medications to the skin.   CHG Compatible Lotions   Aveeno Moisturizing lotion  Cetaphil Moisturizing Cream  Cetaphil Moisturizing Lotion  Clairol Herbal Essence Moisturizing Lotion, Dry Skin  Clairol Herbal Essence Moisturizing Lotion, Extra Dry Skin  Clairol Herbal Essence Moisturizing Lotion, Normal Skin  Curel Age Defying Therapeutic Moisturizing Lotion with Alpha Hydroxy  Curel Extreme Care Body Lotion  Curel Soothing Hands Moisturizing Hand Lotion  Curel Therapeutic Moisturizing Cream, Fragrance-Free  Curel Therapeutic Moisturizing Lotion, Fragrance-Free  Curel Therapeutic Moisturizing Lotion, Original Formula  Eucerin Daily Replenishing Lotion  Eucerin Dry Skin Therapy Plus Alpha Hydroxy Crme  Eucerin Dry Skin Therapy Plus Alpha Hydroxy Lotion  Eucerin Original Crme  Eucerin Original Lotion  Eucerin Plus Crme Eucerin Plus Lotion  Eucerin TriLipid Replenishing Lotion  Keri Anti-Bacterial Hand Lotion  Keri Deep Conditioning Original Lotion Dry Skin Formula Softly Scented  Keri Deep Conditioning Original Lotion, Fragrance Free Sensitive Skin Formula  Keri Lotion Fast Absorbing Fragrance Free Sensitive Skin Formula  Keri Lotion Fast Absorbing Softly Scented Dry Skin Formula  Keri Original Lotion  Keri Skin Renewal Lotion Keri Silky Smooth Lotion  Keri Silky Smooth Sensitive Skin Lotion  Nivea Body Creamy Conditioning Oil  Nivea Body Extra Enriched Teacher, Adult Education Moisturizing Lotion Nivea Crme  Nivea Skin Firming Lotion  NutraDerm 30 Skin Lotion  NutraDerm Skin Lotion  NutraDerm Therapeutic Skin Cream  NutraDerm Therapeutic Skin Lotion  ProShield Protective Hand Cream Espirmetro  de incentivo (Vea este video en casa: [email or web address])  Un espirmetro de incentivo es una herramienta que puede ayudarle a mantener los pulmones limpios y activos. Esta herramienta mide la capacidad en la que se llenan los pulmones con cada respiracin. Tomar respiraciones largas y profundas puede ayudar a revertir o disminuir la probabilidad de risk manager respiratorios (pulmonares) (especialmente infecciones) a consecuencia de: Un largo perodo de [employer]pp[employer] partners no puede moverse o estar activo(a). ANTES DEL PROCEDIMIENTO  Si el espirmetro incluye un indicador para mostrar su mejor esfuerzo, su enfermera o el(la) terapeuta respiratorio(a) lo ajustar a una meta deseada. Si es posible, sintese derecho(a) o ligeramente inclinado(a) hacia adelante. Trate de no encorvarse. Sujete el espirmetro de incentive en posicin erguida. INSTRUCCIONES DE USO  Sintese en el borde de la cama si es posible, o sintese lo mejor que pueda en la cama o en una silla. Sujete el espirmetro de incentivo en posicin recta. Exhale el aire normalmente. Colquese la boquilla en la boca y cierre bien los labios alrededor de ella. Inspire de forma lenta y lo ms profundamente posible, elevando el pistn o la constellation energy parte superior del cilindro. Aguante le respiracin durante 3-5 segundos o tanto tiempo como sea posible. Deje que el pistn o la bolita caigan hasta la parte inferior del cilindro. Retire la boquilla de la boca y exhale normalmente. Descanse unos pocos segundos y repita los pasos del 1 al 7,  al menos 10 veces cada 1-2 horas cuando est despierto(a). Tmese su tiempo y realice algunas respiraciones normales entre las respiraciones profundas. El espirmetro puede incluir un indicador para mostrar su mejor esfuerzo. Use el indicador como una meta a alcanzar por  cada respiracin. Despus de cada serie de 10 respiraciones, practique la tos para asegurarse de que  sus pulmones estn limpios. Si tiene una incisin (el corte realizado al momento de la ciruga), sujete la incisin al toser colocando una almohada o una toalla enrollada firmemente contra ella. Cuando ya pueda levantarse de la cama, camine dentro de la casa y tosa bien. Puede dejar de utilizar el espirmetro de incentivo cuando se lo indique el(la) que est a cargo de su cuidado.  RIESGOS Y COMPLICACIONES Tmese su tiempo para no marearse ni sentirse aturdido(a). Si tiene engineer, mining, es posible que necesite tomar o pedir medicamento para chief technology officer antes de usar el espirmetro de incentivo. Es ms difcil respirar profundamente si tiene dolor. DESPUS DEL USO Descanse y respire lenta y fcilmente. Puede ser til llevar un registro de su progreso. El(la) que est a cargo de su cuidado puede darle una tabla sencilla para ayudarle con esto. Si est utilizando el espirmetro en casa, siga estas instrucciones: BUSQUE ATENCIN MDICA SI:  Est teniendo dificultad para usar el espirmetro. Tiene problemas para usar el espirmetro con la frecuencia que se le indic. Su medicamento para chief technology officer no le est aliviando lo suficiente cuando est usando el espirmetro. Tiene fiebre de 100.5 F (38.1 C) o ms. BUSQUE ATENCIN MDICA INMEDIATA SI:  Al toser expulse un esputo con sangre que no tena antes. Tiene fiebre de 102 F (38.9 C) o ms. Le empeora el dolor en el lugar de la incisin o cerca de l. ASEGRESE DE QUE:  Entiende estas instrucciones. Observar su condicin. Buscar ayuda de inmediato si no se siente bien o empeora. Documento publicado: 03/17/2007 Documento revisado: 01/27/2012 Documentp revisado: 05/18/2007 ExitCare Patient Information 2014 [place], [place].

## 2024-12-13 NOTE — Progress Notes (Signed)
Pt. Needs orders for surgery. 

## 2024-12-13 NOTE — Progress Notes (Signed)
 Sent message, via epic in basket, requesting orders in epic from Careers adviser.

## 2024-12-15 ENCOUNTER — Encounter (HOSPITAL_COMMUNITY): Payer: Self-pay

## 2024-12-15 ENCOUNTER — Encounter (HOSPITAL_COMMUNITY)
Admission: RE | Admit: 2024-12-15 | Discharge: 2024-12-15 | Disposition: A | Discharge status: Home / Self Care | Payer: Self-pay | Source: Ambulatory Visit | Attending: Orthopedic Surgery | Admitting: Orthopedic Surgery

## 2024-12-15 ENCOUNTER — Other Ambulatory Visit: Payer: Self-pay

## 2024-12-15 VITALS — BP 151/54 | HR 63 | Temp 98.3°F | Ht 61.0 in | Wt 162.0 lb

## 2024-12-15 DIAGNOSIS — Z01812 Encounter for preprocedural laboratory examination: Secondary | ICD-10-CM | POA: Diagnosis present

## 2024-12-15 DIAGNOSIS — E1159 Type 2 diabetes mellitus with other circulatory complications: Secondary | ICD-10-CM | POA: Diagnosis not present

## 2024-12-15 DIAGNOSIS — Z01818 Encounter for other preprocedural examination: Secondary | ICD-10-CM | POA: Diagnosis not present

## 2024-12-15 DIAGNOSIS — I152 Hypertension secondary to endocrine disorders: Secondary | ICD-10-CM | POA: Insufficient documentation

## 2024-12-15 DIAGNOSIS — Z01810 Encounter for preprocedural cardiovascular examination: Secondary | ICD-10-CM | POA: Diagnosis present

## 2024-12-15 HISTORY — DX: Unspecified osteoarthritis, unspecified site: M19.90

## 2024-12-15 HISTORY — DX: Prediabetes: R73.03

## 2024-12-15 LAB — BASIC METABOLIC PANEL WITH GFR
Anion gap: 12 (ref 5–15)
BUN: 12 mg/dL (ref 8–23)
CO2: 22 mmol/L (ref 22–32)
Calcium: 9.7 mg/dL (ref 8.9–10.3)
Chloride: 106 mmol/L (ref 98–111)
Creatinine, Ser: 0.79 mg/dL (ref 0.44–1.00)
GFR, Estimated: >60 mL/min
Glucose, Bld: 119 mg/dL — ABNORMAL HIGH (ref 70–99)
Potassium: 4.5 mmol/L (ref 3.5–5.1)
Sodium: 140 mmol/L (ref 135–145)

## 2024-12-15 LAB — CBC
HCT: 45 % (ref 36.0–46.0)
Hemoglobin: 14.5 g/dL (ref 12.0–15.0)
MCH: 31 pg (ref 26.0–34.0)
MCHC: 32.2 g/dL (ref 30.0–36.0)
MCV: 96.2 fL (ref 80.0–100.0)
Platelets: 250 10*3/uL (ref 150–400)
RBC: 4.68 MIL/uL (ref 3.87–5.11)
RDW: 13 % (ref 11.5–15.5)
WBC: 7.2 10*3/uL (ref 4.0–10.5)
nRBC: 0 % (ref 0.0–0.2)

## 2024-12-15 LAB — SURGICAL PCR SCREEN
MRSA, PCR: NEGATIVE
Staphylococcus aureus: NEGATIVE

## 2024-12-15 NOTE — Progress Notes (Signed)
 For Anesthesia: PCP - Theophilus Andrews, Tully GRADE, MD  Cardiologist - N/A  Bowel Prep reminder:  Chest x-ray -  EKG - 12/15/24 Stress Test -  ECHO -  Cardiac Cath -  Pacemaker/ICD device last checked: Pacemaker orders received: Device Rep notified:  Spinal Cord Stimulator:N/A  Sleep Study - N/A CPAP -   Fasting Blood Sugar - N/A Checks Blood Sugar _____ times a day Date and result of last Hgb A1c- 12/22/23: 6.4  Last dose of GLP1 agonist- N/A GLP1 instructions: Hold 7 days prior to schedule (Hold 24 hours-daily)   Last dose of SGLT-2 inhibitors- N/A SGLT-2 instructions: Hold 72 hours prior to surgery  Blood Thinner Instructions:N/A Last Dose: Time last taken:  Aspirin Instructions:N/A Last Dose: Time last taken:  Activity level: Can go up a flight of stairs and activities of daily living without stopping and without chest pain and/or shortness of breath   Able to exercise without chest pain and/or shortness of breath  Anesthesia review:   Patient denies shortness of breath, fever, cough and chest pain at PAT appointment   Patient verbalized understanding of instructions that were reviewed over the telephone.

## 2024-12-16 ENCOUNTER — Other Ambulatory Visit: Payer: Self-pay | Admitting: Orthopedic Surgery

## 2024-12-16 ENCOUNTER — Telehealth: Payer: Self-pay

## 2024-12-16 NOTE — Telephone Encounter (Signed)
 Copied from CRM #[id number]. Topic: Appointments - Scheduling Inquiry for Clinic >> Dec 16, 2024  4:36 PM [name] wrote: Reason for CRM: Pt would like to [facility] to [place], Dawn Espinoza and establish care due to him being a Spanish speaker. Please call daughter Hadassah to set app on #[phone]

## 2024-12-17 DIAGNOSIS — M1711 Unilateral primary osteoarthritis, right knee: Secondary | ICD-10-CM | POA: Diagnosis present

## 2024-12-17 NOTE — H&P (Signed)
 TOTAL KNEE ADMISSION H&P  Patient is being admitted for right total knee arthroplasty.  Subjective:  Chief Complaint:right knee pain.  HPI: Dawn Espinoza, 64 y.o. female, has a history of pain and functional disability in the right knee due to arthritis and has failed non-surgical conservative treatments for greater than 12 weeks to includeNSAID's and/or analgesics, corticosteriod injections, flexibility and strengthening excercises, use of assistive devices, weight reduction as appropriate, and activity modification.  Onset of symptoms was gradual, starting 1 years ago with gradually worsening course since that time. The patient noted no past surgery on the right knee(s).  Patient currently rates pain in the right knee(s) at 10 out of 10 with activity. Patient has night pain, worsening of pain with activity and weight bearing, pain that interferes with activities of daily living, pain with passive range of motion, crepitus, and joint swelling.  Patient has evidence of subchondral sclerosis, periarticular osteophytes, and joint space narrowing by imaging studies. There is no active infection.  Patient Active Problem List   Diagnosis Date Noted   Osteoarthritis of right knee 12/17/2024   Hypertension associated with diabetes (HCC) 03/15/2020   Dyslipidemia associated with type 2 diabetes mellitus (HCC) 03/15/2020   Elevated liver enzymes 09/01/2019   Hx of colonic polyps 09/01/2019   Influenza vaccination declined 09/01/2019   Arthritis of knee 07/14/2019   Fatty liver 07/14/2019   History of total left knee replacement 07/14/2019   Mixed hyperlipidemia 07/14/2019   Obesity (BMI 30-39.9) 07/14/2019   Past Medical History:  Diagnosis Date   Arthritis    Hepatic steatosis    HLD (hyperlipidemia)    Hypertension    Internal hemorrhoids    Pre-diabetes     Past Surgical History:  Procedure Laterality Date   COLONOSCOPY     TOTAL KNEE ARTHROPLASTY Left     No current  facility-administered medications for this encounter.   Current Outpatient Medications  Medication Sig Dispense Refill Last Dose/Taking   amLODipine  (NORVASC ) 5 MG tablet Take 1 tablet (5 mg total) by mouth daily. 90 tablet 1 Taking   atorvastatin  (LIPITOR) 20 MG tablet Take 1 tablet (20 mg total) by mouth daily. (Patient taking differently: Take 20 mg by mouth at bedtime.) 90 tablet 1 Taking Differently   VITAMIN D  PO Take 1 capsule by mouth daily.   Taking   CELEBREX 200 MG capsule Take 1 capsule twice a day by oral route with meal(s) for 30 days. (Patient not taking: Reported on 12/15/2024)   Not Taking   COLACE 100 MG capsule Take 1 capsule twice a day by oral route for 30 days, for constipation prevention. (Patient not taking: Reported on 12/15/2024)   Not Taking   oxyCODONE  (OXY IR/ROXICODONE ) 5 MG immediate release tablet Take 1 tablet every 4 hours by oral route as needed, for pain. (Patient not taking: Reported on 12/15/2024)   Not Taking   tiZANidine  (ZANAFLEX ) 2 MG tablet Take 1 tablet every 8 hours by oral route as needed, for spasm. (Patient not taking: Reported on 12/15/2024)   Not Taking   Allergies[1]  Social History   Tobacco Use   Smoking status: Never   Smokeless tobacco: Never  Substance Use Topics   Alcohol use: No    Alcohol/week: 0.0 standard drinks of alcohol    Family History  Problem Relation Age of Onset   Hypertension Mother    Alcohol abuse Father    Heart disease Father      Review of Systems  Constitutional: Negative.  HENT: Negative.    Eyes: Negative.   Respiratory: Negative.    Cardiovascular: Negative.   Gastrointestinal: Negative.   Endocrine: Negative.   Genitourinary: Negative.   Musculoskeletal:  Positive for arthralgias.  Allergic/Immunologic: Negative.   Neurological: Negative.   Hematological: Negative.   Psychiatric/Behavioral: Negative.      Objective:  Physical Exam Constitutional:      Appearance: Normal appearance. She is  obese.  HENT:     Head: Normocephalic and atraumatic.     Nose: Nose normal.  Eyes:     Pupils: Pupils are equal, round, and reactive to light.  Cardiovascular:     Pulses: Normal pulses.  Pulmonary:     Effort: Pulmonary effort is normal.  Musculoskeletal:        General: Swelling and tenderness present.     Cervical back: Normal range of motion and neck supple.     Comments: She has crepitation through range of motion. No ligamentous laxity. ROM is 0 degrees to 115 degrees of flexion. She ambulates with a limp.  Skin:    General: Skin is warm and dry.  Neurological:     General: No focal deficit present.     Mental Status: She is alert and oriented to person, place, and time. Mental status is at baseline.  Psychiatric:        Mood and Affect: Mood normal.        Behavior: Behavior normal.        Thought Content: Thought content normal.        Judgment: Judgment normal.     Vital signs in last 24 hours:    Labs:   Estimated body mass index is 30.61 kg/m as calculated from the following:   Height as of 12/15/24: 5' 1 (1.549 m).   Weight as of 12/15/24: 73.5 kg.   Imaging Review Plain radiographs demonstrate  right knee including 4 views AP, lateral, von Rosen, and sunrise views show that she has bone-on-bone in the medial compartment of her right knee without acute bony abnormalities.      Assessment/Plan:  End stage arthritis, right knee   The patient history, physical examination, clinical judgment of the provider and imaging studies are consistent with end stage degenerative joint disease of the right knee(s) and total knee arthroplasty is deemed medically necessary. The treatment options including medical management, injection therapy arthroscopy and arthroplasty were discussed at length. The risks and benefits of total knee arthroplasty were presented and reviewed. The risks due to aseptic loosening, infection, stiffness, patella tracking problems, thromboembolic  complications and other imponderables were discussed. The patient acknowledged the explanation, agreed to proceed with the plan and consent was signed. Patient is being admitted for inpatient treatment for surgery, pain control, PT, OT, prophylactic antibiotics, VTE prophylaxis, progressive ambulation and ADL's and discharge planning. The patient is planning to be discharged home with home health services     Patient's anticipated LOS is less than 2 midnights, meeting these requirements: - Younger than 53 - Lives within 1 hour of care - Has a competent adult at home to recover with post-op recover - NO history of  - Chronic pain requiring opiods  - Diabetes  - Coronary Artery Disease  - Heart failure  - Heart attack  - Stroke  - DVT/VTE  - Cardiac arrhythmia  - Respiratory Failure/COPD  - Renal failure  - Anemia  - Advanced Liver disease      [1] No Known Allergies

## 2024-12-17 NOTE — Telephone Encounter (Signed)
 Okay.  I will accept her as a new patient.  Thank you.

## 2024-12-17 NOTE — Telephone Encounter (Signed)
 Please advise.

## 2024-12-20 ENCOUNTER — Ambulatory Visit (HOSPITAL_COMMUNITY)
Admission: RE | Admit: 2024-12-20 | Discharge: 2024-12-20 | Disposition: A | Attending: Orthopedic Surgery | Admitting: Orthopedic Surgery

## 2024-12-20 ENCOUNTER — Encounter (HOSPITAL_COMMUNITY): Admission: RE | Payer: Self-pay | Source: Home / Self Care

## 2024-12-20 ENCOUNTER — Encounter (HOSPITAL_COMMUNITY): Payer: Self-pay | Admitting: Orthopedic Surgery

## 2024-12-20 ENCOUNTER — Encounter (HOSPITAL_COMMUNITY): Payer: Self-pay | Admitting: Anesthesiology

## 2024-12-20 ENCOUNTER — Other Ambulatory Visit: Payer: Self-pay

## 2024-12-20 DIAGNOSIS — I152 Hypertension secondary to endocrine disorders: Secondary | ICD-10-CM | POA: Insufficient documentation

## 2024-12-20 DIAGNOSIS — E7849 Other hyperlipidemia: Secondary | ICD-10-CM | POA: Insufficient documentation

## 2024-12-20 DIAGNOSIS — Z8249 Family history of ischemic heart disease and other diseases of the circulatory system: Secondary | ICD-10-CM | POA: Insufficient documentation

## 2024-12-20 DIAGNOSIS — M1711 Unilateral primary osteoarthritis, right knee: Secondary | ICD-10-CM | POA: Diagnosis present

## 2024-12-20 DIAGNOSIS — E1169 Type 2 diabetes mellitus with other specified complication: Secondary | ICD-10-CM | POA: Insufficient documentation

## 2024-12-20 LAB — GLUCOSE, CAPILLARY
Glucose-Capillary: 122 mg/dL — ABNORMAL HIGH (ref 70–99)
Glucose-Capillary: 148 mg/dL — ABNORMAL HIGH (ref 70–99)

## 2024-12-20 MED ORDER — CHLORHEXIDINE GLUCONATE 0.12 % MT SOLN
15.0000 mL | Freq: Once | OROMUCOSAL | Status: AC
  Administered 2024-12-20: 15 mL via OROMUCOSAL

## 2024-12-20 MED ORDER — LACTATED RINGERS IV BOLUS: 500.0000 mL | Freq: Once | INTRAVENOUS | Status: DC

## 2024-12-20 MED ORDER — DEXAMETHASONE SOD PHOSPHATE PF 10 MG/ML IJ SOLN
INTRAMUSCULAR | Status: DC | PRN
  Administered 2024-12-20: 5 mg via INTRAVENOUS

## 2024-12-20 MED ORDER — LACTATED RINGERS IV SOLN: INTRAVENOUS | Status: DC

## 2024-12-20 MED ORDER — MIDAZOLAM HCL (PF) 2 MG/2ML IJ SOLN
INTRAMUSCULAR | Status: DC | PRN
  Administered 2024-12-20: 1 mg via INTRAVENOUS

## 2024-12-20 MED ORDER — METHOCARBAMOL 1000 MG/10ML IJ SOLN: 500.0000 mg | Freq: Four times a day (QID) | INTRAMUSCULAR | Status: DC | PRN

## 2024-12-20 MED ORDER — INSULIN ASPART 100 UNIT/ML IJ SOLN: 0.0000 [IU] | INTRAMUSCULAR | Status: DC | PRN

## 2024-12-20 MED ORDER — MIDAZOLAM HCL 2 MG/2ML IJ SOLN
INTRAMUSCULAR | Status: AC
  Filled 2024-12-20: qty 2

## 2024-12-20 MED ORDER — PROPOFOL 10 MG/ML IV BOLUS
INTRAVENOUS | Status: DC | PRN
  Administered 2024-12-20: 30 mg via INTRAVENOUS

## 2024-12-20 MED ORDER — DOCUSATE SODIUM 100 MG PO CAPS: 100.0000 mg | ORAL_CAPSULE | Freq: Two times a day (BID) | ORAL | 2 refills | Status: AC

## 2024-12-20 MED ORDER — BUPIVACAINE IN DEXTROSE 0.75-8.25 % IT SOLN
INTRATHECAL | Status: DC | PRN
  Administered 2024-12-20: 1.6 mL via INTRATHECAL

## 2024-12-20 MED ORDER — BUPIVACAINE LIPOSOME 1.3 % IJ SUSP
INTRAMUSCULAR | Status: AC
  Filled 2024-12-20: qty 20

## 2024-12-20 MED ORDER — SODIUM CHLORIDE (PF) 0.9 % IJ SOLN
INTRAMUSCULAR | Status: DC | PRN
  Administered 2024-12-20: 100 mL

## 2024-12-20 MED ORDER — LACTATED RINGERS IV BOLUS: 250.0000 mL | Freq: Once | INTRAVENOUS | Status: DC

## 2024-12-20 MED ORDER — LIDOCAINE HCL (CARDIAC) PF 100 MG/5ML IV SOSY
PREFILLED_SYRINGE | INTRAVENOUS | Status: DC | PRN
  Administered 2024-12-20: 60 mg via INTRAVENOUS

## 2024-12-20 MED ORDER — TRANEXAMIC ACID 1000 MG/10ML IV SOLN
INTRAVENOUS | Status: DC | PRN
  Administered 2024-12-20: 2000 mg via TOPICAL

## 2024-12-20 MED ORDER — CEFAZOLIN SODIUM-DEXTROSE 2-4 GM/100ML-% IV SOLN
2.0000 g | INTRAVENOUS | Status: AC
  Administered 2024-12-20: 2 g via INTRAVENOUS
  Filled 2024-12-20: qty 100

## 2024-12-20 MED ORDER — MIDAZOLAM HCL (PF) 2 MG/2ML IJ SOLN: 1.0000 mg | INTRAMUSCULAR | Status: DC

## 2024-12-20 MED ORDER — FENTANYL CITRATE (PF) 50 MCG/ML IJ SOSY
25.0000 ug | PREFILLED_SYRINGE | INTRAMUSCULAR | Status: DC | PRN
  Administered 2024-12-20 (×2): 50 ug via INTRAVENOUS

## 2024-12-20 MED ORDER — FENTANYL CITRATE (PF) 100 MCG/2ML IJ SOLN
INTRAMUSCULAR | Status: DC | PRN
  Administered 2024-12-20: 50 ug via INTRAVENOUS

## 2024-12-20 MED ORDER — OXYCODONE HCL 5 MG PO TABS
ORAL_TABLET | ORAL | Status: AC
  Filled 2024-12-20: qty 1

## 2024-12-20 MED ORDER — METHOCARBAMOL 500 MG PO TABS: 500.0000 mg | ORAL_TABLET | Freq: Four times a day (QID) | ORAL | Status: DC | PRN

## 2024-12-20 MED ORDER — SODIUM CHLORIDE (PF) 0.9 % IJ SOLN
INTRAMUSCULAR | Status: AC
  Filled 2024-12-20: qty 100

## 2024-12-20 MED ORDER — POVIDONE-IODINE 10 % EX SWAB: 2.0000 | Freq: Once | CUTANEOUS | Status: DC

## 2024-12-20 MED ORDER — TRANEXAMIC ACID-NACL 1000-0.7 MG/100ML-% IV SOLN
1000.0000 mg | INTRAVENOUS | Status: AC
  Administered 2024-12-20: 1000 mg via INTRAVENOUS
  Filled 2024-12-20: qty 100

## 2024-12-20 MED ORDER — BUPIVACAINE-EPINEPHRINE (PF) 0.5% -1:200000 IJ SOLN
INTRAMUSCULAR | Status: AC
  Filled 2024-12-20: qty 30

## 2024-12-20 MED ORDER — FENTANYL CITRATE (PF) 50 MCG/ML IJ SOSY: 50.0000 ug | PREFILLED_SYRINGE | INTRAMUSCULAR | Status: DC

## 2024-12-20 MED ORDER — BUPIVACAINE LIPOSOME 1.3 % IJ SUSP: 20.0000 mL | Freq: Once | INTRAMUSCULAR | Status: DC

## 2024-12-20 MED ORDER — ORAL CARE MOUTH RINSE: 15.0000 mL | Freq: Once | OROMUCOSAL | Status: AC

## 2024-12-20 MED ORDER — SODIUM CHLORIDE 0.9 % IR SOLN
Status: DC | PRN
  Administered 2024-12-20: 1000 mL

## 2024-12-20 MED ORDER — OXYCODONE HCL 5 MG PO TABS
5.0000 mg | ORAL_TABLET | Freq: Once | ORAL | Status: AC | PRN
  Administered 2024-12-20: 5 mg via ORAL

## 2024-12-20 MED ORDER — EPHEDRINE SULFATE-NACL 50-0.9 MG/10ML-% IV SOSY
PREFILLED_SYRINGE | INTRAVENOUS | Status: DC | PRN
  Administered 2024-12-20: 5 mg via INTRAVENOUS
  Administered 2024-12-20: 2.5 mg via INTRAVENOUS
  Administered 2024-12-20: 5 mg via INTRAVENOUS

## 2024-12-20 MED ORDER — TRANEXAMIC ACID-NACL 1000-0.7 MG/100ML-% IV SOLN: 1000.0000 mg | Freq: Once | INTRAVENOUS | Status: DC

## 2024-12-20 MED ORDER — PROPOFOL 10 MG/ML IV BOLUS
INTRAVENOUS | Status: AC
  Filled 2024-12-20: qty 20

## 2024-12-20 MED ORDER — ONDANSETRON HCL 4 MG/2ML IJ SOLN
INTRAMUSCULAR | Status: DC | PRN
  Administered 2024-12-20: 4 mg via INTRAVENOUS

## 2024-12-20 MED ORDER — PROPOFOL 500 MG/50ML IV EMUL
INTRAVENOUS | Status: DC | PRN
  Administered 2024-12-20: 40 ug/kg/min via INTRAVENOUS

## 2024-12-20 MED ORDER — TIZANIDINE HCL 2 MG PO TABS: 2.0000 mg | ORAL_TABLET | Freq: Four times a day (QID) | ORAL | 0 refills | Status: AC | PRN

## 2024-12-20 MED ORDER — PHENYLEPHRINE HCL-NACL 20-0.9 MG/250ML-% IV SOLN
INTRAVENOUS | Status: DC | PRN
  Administered 2024-12-20: 30 ug/min via INTRAVENOUS

## 2024-12-20 MED ORDER — SODIUM CHLORIDE 0.9% FLUSH
INTRAVENOUS | Status: DC | PRN
  Administered 2024-12-20: 50 mL via TOPICAL

## 2024-12-20 MED ORDER — BUPIVACAINE-EPINEPHRINE (PF) 0.5% -1:200000 IJ SOLN
INTRAMUSCULAR | Status: DC | PRN
  Administered 2024-12-20: 20 mL via PERINEURAL

## 2024-12-20 MED ORDER — OXYCODONE HCL 5 MG/5ML PO SOLN: 5.0000 mg | Freq: Once | ORAL | Status: AC | PRN

## 2024-12-20 MED ORDER — WATER FOR IRRIGATION, STERILE IR SOLN
Status: DC | PRN
  Administered 2024-12-20: 2000 mL

## 2024-12-20 MED ORDER — FENTANYL CITRATE (PF) 50 MCG/ML IJ SOSY
PREFILLED_SYRINGE | INTRAMUSCULAR | Status: AC
  Filled 2024-12-20: qty 2

## 2024-12-20 MED ORDER — ACETAMINOPHEN 10 MG/ML IV SOLN: 1000.0000 mg | Freq: Once | INTRAVENOUS | Status: DC | PRN

## 2024-12-20 MED ORDER — FENTANYL CITRATE (PF) 100 MCG/2ML IJ SOLN
INTRAMUSCULAR | Status: AC
  Filled 2024-12-20: qty 2

## 2024-12-20 MED ORDER — TRANEXAMIC ACID 1000 MG/10ML IV SOLN
INTRAVENOUS | Status: AC
  Filled 2024-12-20: qty 20

## 2024-12-20 MED ORDER — ONDANSETRON HCL 4 MG/2ML IJ SOLN
INTRAMUSCULAR | Status: AC
  Filled 2024-12-20: qty 2

## 2024-12-20 MED ORDER — OXYCODONE-ACETAMINOPHEN 5-325 MG PO TABS: 1.0000 | ORAL_TABLET | ORAL | 0 refills | Status: AC | PRN

## 2024-12-20 MED ORDER — ASPIRIN 325 MG PO TBEC: 325.0000 mg | DELAYED_RELEASE_TABLET | Freq: Two times a day (BID) | ORAL | 0 refills | Status: AC

## 2024-12-20 MED ORDER — DEXAMETHASONE SOD PHOSPHATE PF 10 MG/ML IJ SOLN
INTRAMUSCULAR | Status: AC
  Filled 2024-12-20: qty 1

## 2024-12-20 MED ORDER — 0.9 % SODIUM CHLORIDE (POUR BTL) OPTIME
TOPICAL | Status: DC | PRN
  Administered 2024-12-20: 1000 mL

## 2024-12-20 NOTE — Progress Notes (Signed)
" °   12/20/24 9076  Language Assistant  Interpreter Mode In Person Face to Face  Interpreter Name [facility]  Interpreter Phone Number - If applicable [phone]  Language & Interpreter (ONLY document if update needed)  Needs Interpreter Y  Preferred Language spq   In person interpreter provided by [facility]. Call post surgery, she will be able to interpret in PACU "

## 2024-12-20 NOTE — Transfer of Care (Signed)
 Immediate Anesthesia Transfer of Care Note  Patient: Dawn Espinoza  Procedure(s) Performed: RIGHT TOTAL KNEE ARTHROPLASTY (Right: Knee)  Patient Location: PACU  Anesthesia Type:Spinal  Level of Consciousness: awake, alert , and patient cooperative  Airway & Oxygen Therapy: Patient Spontanous Breathing and Patient connected to face mask oxygen  Post-op Assessment: Report given to RN and Post -op Vital signs reviewed and stable  Post vital signs: Reviewed and stable  Last Vitals:  Vitals Value Taken Time  BP 130/61 12/20/24 13:20  Temp    Pulse 100 12/20/24 13:22  Resp 22 12/20/24 13:22  SpO2 98 % 12/20/24 13:22  Vitals shown include unfiled device data.  Last Pain:  Vitals:   12/20/24 0925  TempSrc: Oral  PainSc: 5          Complications: No notable events documented.

## 2024-12-20 NOTE — Discharge Instructions (Addendum)

## 2024-12-20 NOTE — Progress Notes (Signed)
 Orthopedic Tech Progress Note Patient Details:  Dawn Espinoza [birth year] [id number]  Ortho Devices Type of Ortho Device: Bone foam zero knee Ortho Device/Splint Location: RLE Ortho Device/Splint Interventions: Ordered, Application, Adjustment   Post Interventions Patient Tolerated: Well Instructions Provided: Adjustment of device, Poper ambulation with device, Care of device  Morna Pink 12/20/2024, 1:51 PM

## 2024-12-20 NOTE — Interval H&P Note (Signed)
 History and Physical Interval Note:  12/20/2024 10:29 AM  Dawn Espinoza  has presented today for surgery, with the diagnosis of RIGHT KNEE OSTEOARTHRITIS.  The various methods of treatment have been discussed with the patient and family. After consideration of risks, benefits and other options for treatment, the patient has consented to  Procedures: ARTHROPLASTY, KNEE, TOTAL (Right) as a surgical intervention.  The patient's history has been reviewed, patient examined, no change in status, stable for surgery.  I have reviewed the patient's chart and labs.  Questions were answered to the patient's satisfaction.     [name]

## 2024-12-20 NOTE — Anesthesia Procedure Notes (Addendum)
 Spinal  Patient location during procedure: OR Start time: 12/20/2024 10:41 AM End time: 12/20/2024 10:47 AM Reason for block: surgical anesthesia  Staffing Performed: anesthesiologist  Authorized by: Leopoldo Bruckner, MD   Performed by: Leopoldo Bruckner, MD  Preanesthetic Checklist Completed: patient identified, IV checked, risks and benefits discussed, surgical consent, monitors and equipment checked, pre-op evaluation and timeout performed Spinal Block Patient position: sitting Prep: DuraPrep Patient monitoring: heart rate, cardiac monitor, continuous pulse ox and blood pressure Approach: midline Location: L3-4 Injection technique: single-shot Needle Needle type: Pencan  Needle gauge: 24 G Needle length: 9 cm Assessment Events: CSF return and second provider

## 2024-12-20 NOTE — Anesthesia Procedure Notes (Signed)
 Anesthesia Regional Block: Adductor canal block   Pre-Anesthetic Checklist: , timeout performed,  Correct Patient, Correct Site, Correct Laterality,  Correct Procedure, Correct Position, site marked,  Risks and benefits discussed,  Surgical consent,  Pre-op evaluation,  At surgeon's request and post-op pain management  Laterality: Right and Lower  Prep: chloraprep       Needles:  Injection technique: Single-shot      Needle Length: 9cm  Needle Gauge: 22     Additional Needles: Arrow StimuQuik ECHO Echogenic Stimulating PNB Needle  Procedures:,,,, ultrasound used (permanent image in chart),,    Narrative:  Start time: 12/20/2024 12:33 PM End time: 12/20/2024 12:38 PM Injection made incrementally with aspirations every 5 mL.  Performed by: Personally  Anesthesiologist: Leopoldo Bruckner, MD

## 2024-12-21 ENCOUNTER — Encounter (HOSPITAL_COMMUNITY): Payer: Self-pay | Admitting: Orthopedic Surgery

## 2025-01-03 ENCOUNTER — Ambulatory Visit: Admitting: Emergency Medicine
